# Patient Record
Sex: Female | Born: 1995
Health system: Southern US, Community
[De-identification: ages and names within clinical notes are randomized; demographics above are authoritative.]

## PROBLEM LIST (undated history)

## (undated) DIAGNOSIS — Z789 Other specified health status: Secondary | ICD-10-CM

## (undated) HISTORY — PX: INGUINAL HERNIA REPAIR: SUR1180

## (undated) HISTORY — PX: OVARIAN CYST REMOVAL: SHX89

---

## 2020-01-10 ENCOUNTER — Other Ambulatory Visit: Payer: Self-pay

## 2020-01-10 ENCOUNTER — Emergency Department (HOSPITAL_COMMUNITY)
Admission: EM | Admit: 2020-01-10 | Discharge: 2020-01-10 | Disposition: A | Discharge status: Home / Self Care | Payer: 59 | Attending: Emergency Medicine | Admitting: Emergency Medicine

## 2020-01-10 ENCOUNTER — Emergency Department (HOSPITAL_COMMUNITY): Payer: 59

## 2020-01-10 DIAGNOSIS — N941 Unspecified dyspareunia: Secondary | ICD-10-CM | POA: Diagnosis not present

## 2020-01-10 DIAGNOSIS — R102 Pelvic and perineal pain: Secondary | ICD-10-CM | POA: Insufficient documentation

## 2020-01-10 DIAGNOSIS — R350 Frequency of micturition: Secondary | ICD-10-CM | POA: Diagnosis not present

## 2020-01-10 LAB — URINALYSIS, ROUTINE W REFLEX MICROSCOPIC
Bilirubin Urine: NEGATIVE
Glucose, UA: NEGATIVE mg/dL
Hgb urine dipstick: NEGATIVE
Ketones, ur: 5 mg/dL — AB
Leukocytes,Ua: NEGATIVE
Nitrite: NEGATIVE
Protein, ur: NEGATIVE mg/dL
Specific Gravity, Urine: 1.008 (ref 1.005–1.030)
pH: 7 (ref 5.0–8.0)

## 2020-01-10 LAB — COMPREHENSIVE METABOLIC PANEL
ALT: 16 U/L (ref 0–44)
AST: 21 U/L (ref 15–41)
Albumin: 4.1 g/dL (ref 3.5–5.0)
Alkaline Phosphatase: 49 U/L (ref 38–126)
Anion gap: 8 (ref 5–15)
BUN: 5 mg/dL — ABNORMAL LOW (ref 6–20)
CO2: 25 mmol/L (ref 22–32)
Calcium: 9.3 mg/dL (ref 8.9–10.3)
Chloride: 104 mmol/L (ref 98–111)
Creatinine, Ser: 0.63 mg/dL (ref 0.44–1.00)
GFR, Estimated: >60 mL/min (ref >60)
Glucose, Bld: 86 mg/dL (ref 70–99)
Potassium: 3.9 mmol/L (ref 3.5–5.1)
Sodium: 137 mmol/L (ref 135–145)
Total Bilirubin: 0.8 mg/dL (ref 0.3–1.2)
Total Protein: 7.9 g/dL (ref 6.5–8.1)

## 2020-01-10 LAB — CBC
HCT: 37.3 % (ref 36.0–46.0)
Hemoglobin: 11.9 g/dL — ABNORMAL LOW (ref 12.0–15.0)
MCH: 27.4 pg (ref 26.0–34.0)
MCHC: 31.9 g/dL (ref 30.0–36.0)
MCV: 85.9 fL (ref 80.0–100.0)
Platelets: 286 10*3/uL (ref 150–400)
RBC: 4.34 MIL/uL (ref 3.87–5.11)
RDW: 12.9 % (ref 11.5–15.5)
WBC: 6.4 10*3/uL (ref 4.0–10.5)
nRBC: 0 % (ref 0.0–0.2)

## 2020-01-10 LAB — LIPASE, BLOOD: Lipase: 28 U/L (ref 11–51)

## 2020-01-10 LAB — I-STAT BETA HCG BLOOD, ED (MC, WL, AP ONLY): I-stat hCG, quantitative: <5 m[IU]/mL (ref <5)

## 2020-01-10 NOTE — ED Triage Notes (Signed)
Pt reports sharp lower abdominal pain x a few months, mostly when she is having sex but also feels pain other times. Also has frequent urination x a few months. Had negative workup for same in [place] and was referred to a GYN there but has since moved to the [place] and has not seen a GYN yet. Hx ovarian cyst and surgery for same.

## 2020-01-10 NOTE — ED Provider Notes (Signed)
[facility] EMERGENCY DEPARTMENT Provider Note   CSN: [id number] Arrival date & time: 01/10/20  1033     History Chief Complaint  Patient presents with  . Abdominal Pain    Crystal Bentley is a 24 y.o. female.  24 year old female with complaint of pelvic pain x approximately 5 months. Patient reports moving to the [place] from [place], was seen for this in [place] in September, had a workup in [place] including ultrasound complete abdomen and testing for gonorrhea and chlamydia which were negative. Patient was married 1 year ago, reports to be in a monogamous relationship, denies vaginal discharge. Pain is intermittent, occurs frequently with intercourse, located across low pelvis. Also urinary frequency without dysuria. Denies, nausea, vomiting, unintentional weight loss, changes in bowel habits. No other complaints or concerns. Prior abdominal surgeries include ovarian cyst removal and hernia repair.         No past medical history on file.  There are no problems to display for this patient.   OB History   No obstetric history on file.     No family history on file.  Social History   Tobacco Use  . Smoking status: Not on file  Substance Use Topics  . Alcohol use: Not on file  . Drug use: Not on file    Home Medications Prior to Admission medications   Medication Sig Start Date End Date Taking? Authorizing Provider  OVER THE COUNTER MEDICATION Take 1 tablet by mouth as needed (uti infection).   Yes [provider]    Allergies    Patient has no known allergies.  Review of Systems   Review of Systems  Constitutional: Negative for fever and unexpected weight change.  Gastrointestinal: Negative for abdominal pain, constipation, diarrhea, nausea and vomiting.  Genitourinary: Positive for frequency and pelvic pain. Negative for dysuria, vaginal bleeding and vaginal discharge.  Musculoskeletal: Negative for back pain.  Skin: Negative for rash  and wound.  Allergic/Immunologic: Negative for immunocompromised state.  Neurological: Negative for weakness.  All other systems reviewed and are negative.   Physical Exam Updated Vital Signs BP 118/76 (BP Location: Right Arm)   Pulse 72   Temp 98.1 F (36.7 C) (Oral)   Resp 16   Ht 5\' 7"  (1.702 m)   Wt 56.7 kg   SpO2 100%   BMI 19.58 kg/m   Physical Exam Vitals and nursing note reviewed.  Constitutional:      General: She is not in acute distress.    Appearance: She is well-developed. She is not diaphoretic.  HENT:     Head: Normocephalic and atraumatic.  Cardiovascular:     Rate and Rhythm: Normal rate and regular rhythm.     Heart sounds: Normal heart sounds.  Pulmonary:     Effort: Pulmonary effort is normal.     Breath sounds: Normal breath sounds.  Abdominal:     General: Abdomen is flat. Bowel sounds are normal.     Palpations: Abdomen is soft.     Tenderness: There is abdominal tenderness in the right lower quadrant, suprapubic area and left lower quadrant. There is no right CVA tenderness or left CVA tenderness.  Skin:    General: Skin is warm and dry.     Findings: No erythema or rash.  Neurological:     Mental Status: She is alert and oriented to person, place, and time.  Psychiatric:        Behavior: Behavior normal.     ED Results /  Procedures / Treatments   Labs (all labs ordered are listed, but only abnormal results are displayed) Labs Reviewed  COMPREHENSIVE METABOLIC PANEL - Abnormal; Notable for the following components:      Result Value   BUN 5 (*)    All other components within normal limits  CBC - Abnormal; Notable for the following components:   Hemoglobin 11.9 (*)    All other components within normal limits  URINALYSIS, ROUTINE W REFLEX MICROSCOPIC - Abnormal; Notable for the following components:   Color, Urine STRAW (*)    Ketones, ur 5 (*)    All other components within normal limits  LIPASE, BLOOD  I-STAT BETA HCG BLOOD, ED  ([facility], [facility], [facility] ONLY)    EKG None  Radiology US PELVIC COMPLETE WITH TRANSVAGINAL  Result Date: 01/10/2020 CLINICAL DATA:  Pelvic pain for 5 months EXAM: TRANSABDOMINAL AND TRANSVAGINAL ULTRASOUND OF PELVIS TECHNIQUE: Both transabdominal and transvaginal ultrasound examinations of the pelvis were performed. Transabdominal technique was performed for global imaging of the pelvis including uterus, ovaries, adnexal regions, and pelvic cul-de-sac. It was necessary to proceed with endovaginal exam following the transabdominal exam to visualize the adnexal structures. COMPARISON:  None FINDINGS: Uterus Measurements: 6.8 x 3.8 x 5.1 cm = volume: 60.6 mL. No fibroids or other mass visualized. Endometrium Thickness: 12 mm.  No focal abnormality visualized. Right ovary Measurements: 2.3 x 3.9 x 1.4 cm = volume: 6.3 mL. Normal appearance/no adnexal mass. Left ovary Not visualized due to bowel gas. Other findings No free fluid or pelvic mass. IMPRESSION: 1. Nonvisualization of the left ovary. 2. Otherwise unremarkable pelvic ultrasound. Electronically Signed   By: Sharlet Salina M.D.   On: 01/10/2020 15:30    Procedures Procedures (including critical care time)  Medications Ordered in ED Medications - No data to display  ED Course  I have reviewed the triage vital signs and the nursing notes.  Pertinent labs & imaging results that were available during my care of the patient were reviewed by me and considered in my medical decision making (see chart for details).  Clinical Course as of Jan 10 1744  Fri Jan 10, 2020  1771 24 year old female with complaint of pelvic pain for several months, worked up 2 months ago while living in [place] with an [place] of her abdomen, labs, also tested for gonorrhea and chlamydia. Prior work up was negative for cause of her pain and she was referred to GYN in [place] but moved before being seen. Patient reports persistent symptoms, pain worse with intercourse, no new or change in  symptoms. On exam, has mild tenderness across low abdomen, pelvic exam deferred as prior work up negative per patient and reports monogamous relationship without concerns for STI.  Pelvic US today is unremarkable, left ovary not visualized, doubt torsion with history of pain for 5 months.  Labs without significant findings including CBC, CMP, lipase, UA, hcg negative. Patient referred to [facility] clinic for follow up. Advised to take Tylenol for pain, avoid NSAIDs if trying to conceive.    [LM]    Clinical Course User Index [LM] Alden Hipp   MDM Rules/Calculators/A&P                          Final Clinical Impression(s) / ED Diagnoses Final diagnoses:  Pelvic pain in female  Urinary frequency  Dyspareunia in female    Rx / DC Orders ED Discharge Orders    None  Jeannie Fend, PA-C 01/10/20 1745    Gerhard Munch, MD 01/11/20 2244

## 2020-01-10 NOTE — Discharge Instructions (Addendum)
Take Tylenol as needed as directed for pain. Follow up with gynecology, listing of clinics provided. Return to the ER for worsening or concerning symptoms.    Walk-ins for certain complaints available at:   [facility] [address]  [phone]  See hours at [email or web address]   [facility] [employer] [facility]  [address]  [phone]   [facility] [employer] [facility] [employer]  [phone]   You can make an appointment to see a GYN provider:   [facility] for [facility] at [facility]  [address]  [phone]   [facility] for [facility] at [facility]  [address] [employer]  [phone]   Center for [facility] at [facility]  [phone]   Center for [facility] at [facility]  [address]  [phone]   If you already have an established OB/GYN provider in the area you can make an appointment with them as well.

## 2020-01-30 ENCOUNTER — Ambulatory Visit: Payer: PRIVATE HEALTH INSURANCE | Admitting: Obstetrics and Gynecology

## 2020-02-08 NOTE — L&D Delivery Note (Addendum)
LABOR COURSE Patient was admitted for SROM at 0800 on 12/5. Fluid noted to be light mec. Mother remained afebrile throughout labor course. Labor was augmented with pitocin. Progressed to complete without issue.   Delivery Note Called to room and patient was complete and pushing. Head delivered LOA. A nuchal and body cord were present and easily reduced at the perineum. Shoulder and body delivered in usual fashion. At  2113 a viable female was delivered via Vaginal, Spontaneous (LOA).  Infant with spontaneous cry, placed on mother's abdomen, dried and stimulated. Cord clamped x 2 after 1-minute delay, and cut by FOB. Cord blood drawn. Placenta delivered spontaneously with gentle cord traction. Appears intact. Fundus firm with massage and Pitocin. Labia, perineum, vagina, and cervix inspected with 2nd degree laceration. Laceration repaired with 3.0 vicryl.    APGAR: 8,9 ; 3480 g weight  .   Cord: 3VC     Anesthesia: Epidural Episiotomy: None Lacerations: 2nd degree Suture Repair: 3.0 vicryl Est. Blood Loss (mL): 100  Mom to postpartum.  Baby to Couplet care / Skin to Skin.  Betti Cruz, MD PGY-2 01/12/2021 10:17 PM   GME ATTESTATION:  I was gloved for entire delivery and supervised and assisted Dr. Criss Rosales with delivery and entire repair. I agree with the findings and the plan of care as documented in the resident's note.  Warner Mccreedy, MD, MPH OB Fellow, Faculty Practice [facility], [facility] 01/13/2021 3:48 AM

## 2020-02-27 ENCOUNTER — Encounter: Payer: Self-pay | Admitting: Obstetrics and Gynecology

## 2020-02-27 ENCOUNTER — Ambulatory Visit (INDEPENDENT_AMBULATORY_CARE_PROVIDER_SITE_OTHER): Payer: BC Managed Care – PPO | Admitting: Obstetrics and Gynecology

## 2020-02-27 ENCOUNTER — Other Ambulatory Visit (HOSPITAL_COMMUNITY)
Admission: RE | Admit: 2020-02-27 | Discharge: 2020-02-27 | Disposition: A | Discharge status: Home / Self Care | Payer: BC Managed Care – PPO | Source: Ambulatory Visit | Attending: Obstetrics and Gynecology | Admitting: Obstetrics and Gynecology

## 2020-02-27 ENCOUNTER — Other Ambulatory Visit: Payer: Self-pay

## 2020-02-27 VITALS — BP 121/85 | HR 82 | Wt 115.8 lb

## 2020-02-27 DIAGNOSIS — N941 Unspecified dyspareunia: Secondary | ICD-10-CM | POA: Diagnosis not present

## 2020-02-27 DIAGNOSIS — Z01411 Encounter for gynecological examination (general) (routine) with abnormal findings: Secondary | ICD-10-CM | POA: Diagnosis not present

## 2020-02-27 LAB — POCT URINALYSIS DIPSTICK
Bilirubin, UA: NEGATIVE
Blood, UA: NEGATIVE
Glucose, UA: NEGATIVE
Ketones, UA: NEGATIVE
Leukocytes, UA: NEGATIVE
Nitrite, UA: NEGATIVE
Protein, UA: NEGATIVE
Spec Grav, UA: 1.015 (ref 1.010–1.025)
Urobilinogen, UA: 0.2 E.U./dL
pH, UA: 7 (ref 5.0–8.0)

## 2020-02-27 LAB — POCT URINE PREGNANCY: Preg Test, Ur: NEGATIVE

## 2020-02-27 NOTE — Progress Notes (Signed)
  Subjective:     Crystal Bentley is a 25 y.o. female P0 with LMP 02/17/20 amd BMI 18 who is here for a comprehensive physical exam. The patient reports a 6 month history of dyspareunia. She was being evaluated for this in [place] prior to her arrival in the [place] and reports a normal ultrasound and STI testing. She is in a monogomous relationship. She reports a monthly period lasting 5 days and associated with mild dysmenorrhea. She is sexual active with her husband and reports lower abdominal pain with penetration regardless of position. She describes the pain as a sharp non radiating pain localized in the RLQ and near hernia repair site. The pain is relieved by tylenol. The pain is always present during intercourse and also occurs intermittently at rest. The pain typically last 15 minutes.  History reviewed. No pertinent past medical history. Past Surgical History:  Procedure Laterality Date  . INGUINAL HERNIA REPAIR    . OVARIAN CYST REMOVAL     No family history on file.  Social History   Socioeconomic History  . Marital status: Single    Spouse name: Not on file  . Number of children: Not on file  . Years of education: Not on file  . Highest education level: Not on file  Occupational History  . Not on file  Tobacco Use  . Smoking status: Not on file  . Smokeless tobacco: Not on file  Substance and Sexual Activity  . Alcohol use: Not on file  . Drug use: Not on file  . Sexual activity: Not on file  Other Topics Concern  . Not on file  Social History Narrative  . Not on file   Social Determinants of Health   Financial Resource Strain: Not on file  Food Insecurity: Not on file  Transportation Needs: Not on file  Physical Activity: Not on file  Stress: Not on file  Social Connections: Not on file  Intimate Partner Violence: Not on file   Health Maintenance  Topic Date Due  . Hepatitis C Screening  Never done  . HIV Screening  Never done  . TETANUS/TDAP  Never done  .  PAP-Cervical Cytology Screening  Never done  . PAP SMEAR-Modifier  Never done  . INFLUENZA VACCINE  Never done       Review of Systems Pertinent items noted in HPI and remainder of comprehensive ROS otherwise negative.   Objective:  Blood pressure 121/85, pulse 82, weight 115 lb 12.8 oz (52.5 kg), last menstrual period 02/17/2020.     GENERAL: Well-developed, well-nourished female in no acute distress.  HEENT: Normocephalic, atraumatic. Sclerae anicteric.  NECK: Supple. Normal thyroid.  LUNGS: Clear to auscultation bilaterally.  HEART: Regular rate and rhythm. BREASTS: Symmetric in size. No palpable masses or lymphadenopathy, skin changes, or nipple drainage. ABDOMEN: Soft, nontender, nondistended. No organomegaly. PELVIC: Normal external female genitalia. Vagina is pink and rugated.  Normal discharge. Normal appearing cervix. Uterus is normal in size.  No adnexal mass or tenderness. EXTREMITIES: No cyanosis, clubbing, or edema, 2+ distal pulses.    Assessment:    Healthy female exam.      Plan:    Pap smear collected Vaginal swab collected to rule out infectious cause of pelvic pain Urine culture collected Patient with normal pelvic ultrasound 01/2020 Patient referred to pelvic physical therapy  Patient will be contacted with abnormal results See After Visit Summary for Counseling Recommendations

## 2020-02-27 NOTE — Progress Notes (Signed)
NGYN pt c/o 8 days late period, painful periods, urinary frequency and dyspareunia. Pt unsure last pap Normal STD screening completed Sept 2021 in [place]

## 2020-02-28 LAB — CERVICOVAGINAL ANCILLARY ONLY
Bacterial Vaginitis (gardnerella): NEGATIVE
Candida Glabrata: NEGATIVE
Candida Vaginitis: NEGATIVE
Chlamydia: NEGATIVE
Comment: NEGATIVE
Comment: NEGATIVE
Comment: NEGATIVE
Comment: NEGATIVE
Comment: NEGATIVE
Comment: NORMAL
Neisseria Gonorrhea: NEGATIVE
Trichomonas: NEGATIVE

## 2020-02-28 LAB — CYTOLOGY - PAP
Adequacy: ABSENT
Diagnosis: NEGATIVE

## 2020-02-29 LAB — URINE CULTURE

## 2020-04-13 ENCOUNTER — Other Ambulatory Visit: Payer: Self-pay

## 2020-04-13 ENCOUNTER — Encounter: Payer: Self-pay | Admitting: Physical Therapy

## 2020-04-13 ENCOUNTER — Ambulatory Visit: Payer: BC Managed Care – PPO | Attending: Obstetrics and Gynecology | Admitting: Physical Therapy

## 2020-04-13 DIAGNOSIS — M6281 Muscle weakness (generalized): Secondary | ICD-10-CM | POA: Insufficient documentation

## 2020-04-13 DIAGNOSIS — R278 Other lack of coordination: Secondary | ICD-10-CM | POA: Insufficient documentation

## 2020-04-13 DIAGNOSIS — R1031 Right lower quadrant pain: Secondary | ICD-10-CM | POA: Insufficient documentation

## 2020-04-13 NOTE — Therapy (Signed)
[facility] [address] [place], [place], [address] Phone: [phone]   Fax:  [phone]  Physical Therapy Evaluation  Patient Details  Name: Crystal Bentley MRN: [id number] Date of Birth: 1995-05-28 Referring Provider (PT): Dr. Catalina Antigua   Encounter Date: 04/13/2020   PT End of Session - 04/13/20 1442    Visit Number 1    Date for PT Re-Evaluation 07/06/20    Authorization Type BC[employer]    PT Start Time 1400    PT Stop Time 1444    PT Time Calculation (min) 44 min    Activity Tolerance Patient tolerated treatment well    Behavior During Therapy [facility] for tasks assessed/performed           History reviewed. No pertinent past medical history.  Past Surgical History:  Procedure Laterality Date  . INGUINAL HERNIA REPAIR     5 yrs   . OVARIAN CYST REMOVAL     7 yrs     There were no vitals filed for this visit.    Subjective Assessment - 04/13/20 1406    Subjective After second operation for hernia repair. Patient feels a sharp pain when cough and sneeze at the operation site. 4 months ago has felt serious pain in right lower quadrant in inguinal area. when has sex with husband has to stop due to pain. Urinates more often. When lays on back feels the urge to urinate. Sometimes has back pain.    Patient Stated Goals reduce pain    Currently in Pain? Yes    Pain Score 10-Worst pain ever    Pain Location Abdomen    Pain Orientation Right    Pain Descriptors / Indicators Cramping    Pain Type Acute pain    Pain Onset More than a month ago    Pain Frequency Intermittent    Aggravating Factors  penile penetration, just happens    Pain Relieving Factors relax on the floor in prone, tylenol    Multiple Pain Sites Yes    Pain Score 5    Pain Location Back    Pain Orientation Right    Pain Descriptors / Indicators Aching    Pain Type Acute pain    Pain Onset More than a month ago    Pain Frequency  Intermittent    Aggravating Factors  comes on sporadically    Pain Relieving Factors massage              [facility] PT Assessment - 04/13/20 0001      Assessment   Medical Diagnosis N94.10 Dyspareunia in female    Referring Provider (PT) Dr. Catalina Antigua    Prior Therapy none      Precautions   Precautions None      Restrictions   Weight Bearing Restrictions No      Balance Screen   Has the patient fallen in the past 6 months No    Has the patient had a decrease in activity level because of a fear of falling?  No    Is the patient reluctant to leave their home because of a fear of falling?  No      Home Tourist information centre manager residence      Prior Function   Level of Independence Independent    Leisure sewing      Cognition   Overall Cognitive Status Within Functional Limits for tasks assessed      Posture/Postural Control  Posture/Postural Control No significant limitations      ROM / Strength   AROM / PROM / Strength AROM;PROM;Strength      AROM   Lumbar - Right Side Bend decreased by 25%    Lumbar - Right Rotation decreased by 25%      Strength   Right Hip ABduction 4/5    Left Hip ABduction 5/5      Palpation   Spinal mobility L5 decreased movement    SI assessment  ASIS are equal    Palpation comment tenderness along scar lower midline abdominal area from cyst removal; tenderness located along the right lower quadrant                      Objective measurements completed on examination: See above findings.     Pelvic Floor Special Questions - 04/13/20 0001    Currently Sexually Active Yes    Is this Painful Yes   deep penetration   Urinary Leakage No    Fecal incontinence No   strain for a bowel movement   Skin Integrity Intact    Pelvic Floor Internal Exam [name]    Exam Type Vaginal    Palpation tenderness in the right obturator  internist and illiococcygeus    Strength weak squeeze, no lift    Tone high tone, not able to bulge the pelvic floor                    PT Education - 04/13/20 1440    Education Details Access Code: [email or web address]    Person(s) Educated Patient    Methods Explanation;Demonstration;Verbal cues;Handout    Comprehension Returned demonstration;Verbalized understanding            PT Short Term Goals - 04/13/20 1458      PT SHORT TERM GOAL #1   Title independent with initial HEP including scar massage and perineal massage    Time 4    Status New    Target Date 05/11/20      PT SHORT TERM GOAL #2   Title ability to bulge the pelvic floor with diaphragmatic breathing    Time 4    Period Weeks    Status New    Target Date 05/11/20             PT Long Term Goals - 04/13/20 1459      PT LONG TERM GOAL #1   Title independent with advanced HEP    Time 12    Period Weeks    Status New    Target Date 07/06/20      PT LONG TERM GOAL #2   Title able to have penile penetration vaginally with pain level </= 0-1/10 due to reduction of scar tissue and improve tissue mobitliy    Time 12    Period Weeks    Status New    Target Date 07/06/20      PT LONG TERM GOAL #3   Title able to perform daily activities with back pain </= 0-1/10 due to improve mobility    Time 12    Period Weeks    Status New    Target Date 07/06/20      PT LONG TERM GOAL #4   Title able to have a bowel movement without straining due to reduction of pelvic floor tightness and elongation with breath    Time 12  Period Weeks    Status New    Target Date 07/06/20                  Plan - 04/13/20 1440    Clinical Impression Statement Patient is a 25 year old female with dyspareunia for several years. Patient is s/p right ovarian cyst removal and right hernia repair. Patient reports her right lower quadrant pain is 10/10 with penile penetration vaginally and randomly. Right back pain is 5/10  randomly. Lumbar right rotation and sidebending is decreased by 25%. Patient bilateral hip abduction is 4/5. Tenderness located in right lower quadrant, right obturator internist and right iliococcygeus. Patient has increased tone of right pelvic floor muscles. Pelvic strength is 2/5 with difficulty relaxing the pelvic floor after a contraction. Patient is not able to bulge the pelvic floor. Patient reports she has to strain when having a bowel movement. Patient has decreased mobilit of the scar along the low abdominal midline.  Patient will benefit from skilled therapy to reduce pain, reduce trigger points in the muscles and improve tissue mobility.    Personal Factors and Comorbidities Comorbidity 2;Sex    Comorbidities right hernia repair; right ovarian cyst removed    Examination-Participation Restrictions Interpersonal Relationship;Community Activity    Stability/Clinical Decision Making Stable/Uncomplicated    Clinical Decision Making Low    Rehab Potential Excellent    PT Frequency 1x / week    PT Duration 12 weeks    PT Treatment/Interventions ADLs/Self Care Home Management;Biofeedback;Cryotherapy;Electrical Stimulation;Iontophoresis 4mg /ml Dexamethasone;Moist Heat;Ultrasound;Neuromuscular re-education;Therapeutic exercise;Therapeutic activities;Patient/family education;Manual techniques;Dry needling;Scar mobilization;Spinal Manipulations    PT Next Visit Plan manual work to abdomen and educate on scar mobilization; diaphragmatic breathing to bulge the pelvic floor; internal manual work and educate patient    PT Home Exercise Plan Access Code:    Consulted and Agree with Plan of Care Patient           Patient will benefit from skilled therapeutic intervention in order to improve the following deficits and impairments:  Decreased coordination,Decreased range of motion,Increased fascial restricitons,Decreased endurance,Increased muscle spasms,Pain,Decreased activity  tolerance,Decreased scar mobility,Decreased strength  Visit Diagnosis: Muscle weakness (generalized) - Plan: PT plan of care cert/re-cert  Other lack of coordination - Plan: PT plan of care cert/re-cert  Right lower quadrant abdominal pain - Plan: PT plan of care cert/re-cert     Problem List There are no problems to display for this patient.   [id number], PT 04/13/20 3:04 PM   [facility] [address], [address] [place], [place], [place] Phone: [phone]   Fax:  [phone]  Name: Crystal Bentley MRN: Earle Gell Date of Birth: Aug 03, 1995

## 2020-04-13 NOTE — Patient Instructions (Signed)
Access Code: [email or web address] URL: [email or web address] Date: 04/13/2020 Prepared by: Eulis Foster  Exercises Supine Diaphragmatic Breathing - 3 x daily - 7 x weekly - 1 sets - 10 reps [facility] [address] [place], [place] [address] Phone # [phone] Fax [phone]

## 2020-04-27 ENCOUNTER — Ambulatory Visit: Payer: BC Managed Care – PPO | Admitting: Physical Therapy

## 2020-04-27 ENCOUNTER — Encounter: Payer: Self-pay | Admitting: Physical Therapy

## 2020-04-27 ENCOUNTER — Other Ambulatory Visit: Payer: Self-pay

## 2020-04-27 DIAGNOSIS — R278 Other lack of coordination: Secondary | ICD-10-CM

## 2020-04-27 DIAGNOSIS — R1031 Right lower quadrant pain: Secondary | ICD-10-CM

## 2020-04-27 DIAGNOSIS — M6281 Muscle weakness (generalized): Secondary | ICD-10-CM

## 2020-04-27 NOTE — Therapy (Signed)
[facility] [address], [place], [place], [address] Phone: [phone]   Fax:  [phone]  Physical Therapy Treatment  Patient Details  Name: Crystal Bentley MRN: [id number] Date of Birth: 1995-05-08 Referring Provider (PT): Dr. Mora Bellman   Encounter Date: 04/27/2020   PT End of Session - 04/27/20 0934    Visit Number 2    Date for PT Re-Evaluation 07/06/20    Authorization Type BCBS    PT Start Time 0930    PT Stop Time 1008    PT Time Calculation (min) 38 min    Activity Tolerance Patient tolerated treatment well    Behavior During Therapy [facility] for tasks assessed/performed           History reviewed. No pertinent past medical history.  Past Surgical History:  Procedure Laterality Date  . INGUINAL HERNIA REPAIR     4 yrs   . OVARIAN CYST REMOVAL     6 yrs     There were no vitals filed for this visit.   Subjective Assessment - 04/27/20 0935    Subjective I feel a little better.    Patient Stated Goals reduce pain    Currently in Pain? Yes    Pain Score 10-Worst pain ever    Pain Location Abdomen    Pain Orientation Right    Pain Descriptors / Indicators Cramping    Pain Type Acute pain    Pain Onset More than a month ago    Pain Frequency Intermittent    Aggravating Factors  penile penetration, just happens    Pain Relieving Factors relax on the floor in prone, tylenol    Multiple Pain Sites Yes    Pain Score 5    Pain Location Back    Pain Orientation Right    Pain Descriptors / Indicators Aching    Pain Type Acute pain    Pain Onset More than a month ago    Pain Frequency Intermittent    Aggravating Factors  comes on sporadically    Pain Relieving Factors massage                             [facility] Adult PT Treatment/Exercise - 04/27/20 0001      Neuro Re-ed    Neuro Re-ed Details  diaphragmatic to open up the lower rib cage and abdomen with tactile cues       Lumbar Exercises: Stretches   Active Hamstring Stretch Right;Left;1 rep;30 seconds    Active Hamstring Stretch Limitations supine    Prone on Elbows Stretch 2 reps;20 seconds    Quadruped Mid Back Stretch 2 reps;30 seconds    Quadruped Mid Back Stretch Limitations childs pose    Piriformis Stretch Right;Left;1 rep    Piriformis Stretch Limitations supine      Lumbar Exercises: Quadruped   Madcat/Old Horse 15 reps      Manual Therapy   Manual Therapy Soft tissue mobilization    Manual therapy comments educated patient on scar massage and she returned demonstration    Soft tissue mobilization scar massage to improve mobiity, manual tissue work to the right side of abdomials to improve mobility                  PT Education - 04/27/20 0948    Education Details Access Code: [email or web address]; diaphragmatic breathing; scar massage    Person(s) Educated Patient    Methods Explanation;Demonstration;Verbal  cues;Handout    Comprehension Returned demonstration;Verbalized understanding            PT Short Term Goals - 04/27/20 0949      PT SHORT TERM GOAL #1   Title independent with initial HEP including scar massage and perineal massage    Time 4    Period Weeks    Status New    Target Date 05/11/20      PT SHORT TERM GOAL #2   Title ability to bulge the pelvic floor with diaphragmatic breathing    Time 4    Period Weeks    Status New    Target Date 05/11/20             PT Long Term Goals - 04/13/20 1459      PT LONG TERM GOAL #1   Title independent with advanced HEP    Time 12    Period Weeks    Status New    Target Date 07/06/20      PT LONG TERM GOAL #2   Title able to have penile penetration vaginally with pain level </= 0-1/10 due to reduction of scar tissue and improve tissue mobitliy    Time 12    Period Weeks    Status New    Target Date 07/06/20      PT LONG TERM GOAL #3   Title able to perform daily activities with back pain </= 0-1/10 due to  improve mobility    Time 12    Period Weeks    Status New    Target Date 07/06/20      PT LONG TERM GOAL #4   Title able to have a bowel movement without straining due to reduction of pelvic floor tightness and elongation with breath    Time 12    Period Weeks    Status New    Target Date 07/06/20                 Plan - 04/27/20 0933    Clinical Impression Statement Patient understands how to perfrom scar massage and diaphragmatic breathing. Difficult to lift scar off the abdomen due to restrictions. Patient needed verbal cues to perform diaphgram atic breathing. Patient just started so she has not met goals yet. Patient will benefit from skilled therapy to reduce pain, reduce trigger points in the muscles and improve tissue mobilty.    Personal Factors and Comorbidities Comorbidity 2;Sex    Comorbidities right hernia repair; right ovarian cyst removed    Examination-Participation Restrictions Interpersonal Relationship;Community Activity    Stability/Clinical Decision Making Stable/Uncomplicated    Rehab Potential Excellent    PT Frequency 1x / week    PT Duration 12 weeks    PT Treatment/Interventions ADLs/Self Care Home Management;Biofeedback;Cryotherapy;Electrical Stimulation;Iontophoresis 41m/ml Dexamethasone;Moist Heat;Ultrasound;Neuromuscular re-education;Therapeutic exercise;Therapeutic activities;Patient/family education;Manual techniques;Dry needling;Scar mobilization;Spinal Manipulations    PT Next Visit Plan ; internal manual work and educate patient; manual work from the back to the abdomen, see how stretches are coming along, review diaphgramatic breathing    PT Home Exercise Plan Access Code: [email or web address]   Recommended Other Services MD signed initial eval    Consulted and Agree with Plan of Care Patient           Patient will benefit from skilled therapeutic intervention in order to improve the following deficits and impairments:  Decreased coordination,Decreased  range of motion,Increased fascial restricitons,Decreased endurance,Increased muscle spasms,Pain,Decreased activity tolerance,Decreased scar mobility,Decreased strength  Visit Diagnosis: Muscle weakness (generalized)  Other lack of coordination  Right lower quadrant abdominal pain     Problem List There are no problems to display for this patient.   Earlie Counts, PT 04/27/20 10:14 AM   [facility] [address] [place], [place], [address] Phone: [phone]   Fax:  [phone]  Name: Sophee Mckimmy MRN: [id number] Date of Birth: 10-May-1995

## 2020-04-27 NOTE — Patient Instructions (Signed)
Access Code: [email or web address] URL: [email or web address] Date: 04/27/2020 Prepared by: Eulis Foster  Exercises Supine Piriformis Stretch - 1 x daily - 7 x weekly - 1 sets - 2 reps - 30 sec hold Supine Hamstring Stretch - 1 x daily - 7 x weekly - 1 sets - 2 reps - 30 sec hold Supine Pelvic Floor Stretch - 1 x daily - 7 x weekly - 1 sets - 1 reps - 1 min hold Cat Cow - 1 x daily - 7 x weekly - 1 sets - 10 reps Child's Pose Stretch - 1 x daily - 7 x weekly - 1 sets - 2 reps - 30 sec hold Static Prone on Elbows - 1 x daily - 7 x weekly - 1 sets - 2 reps - 15 sec hold [facility] [address], [address] [place], [place] [address] Phone # [phone] Fax [phone]

## 2020-05-04 ENCOUNTER — Encounter: Payer: Self-pay | Admitting: Physical Therapy

## 2020-05-04 ENCOUNTER — Other Ambulatory Visit: Payer: Self-pay

## 2020-05-04 ENCOUNTER — Ambulatory Visit: Payer: BC Managed Care – PPO | Admitting: Physical Therapy

## 2020-05-04 DIAGNOSIS — R1031 Right lower quadrant pain: Secondary | ICD-10-CM

## 2020-05-04 DIAGNOSIS — R278 Other lack of coordination: Secondary | ICD-10-CM

## 2020-05-04 DIAGNOSIS — M6281 Muscle weakness (generalized): Secondary | ICD-10-CM

## 2020-05-04 NOTE — Patient Instructions (Addendum)

## 2020-05-04 NOTE — Therapy (Signed)
[facility] [address] [place], [place], [address] Phone: [phone]   Fax:  [phone]  Physical Therapy Treatment  Patient Details  Name: Crystal Bentley MRN: [id number] Date of Birth: 06-06-1995 Referring Provider (PT): Dr. Catalina Antigua   Encounter Date: 05/04/2020   PT End of Session - 05/04/20 1226    Visit Number 3    Date for PT Re-Evaluation 07/06/20    Authorization Type BCBS    PT Start Time 1145    PT Stop Time 1225    PT Time Calculation (min) 40 min    Activity Tolerance Patient tolerated treatment well    Behavior During Therapy [facility] for tasks assessed/performed           History reviewed. No pertinent past medical history.  Past Surgical History:  Procedure Laterality Date  . INGUINAL HERNIA REPAIR     4 yrs   . OVARIAN CYST REMOVAL     6 yrs     There were no vitals filed for this visit.   Subjective Assessment - 05/04/20 1150    Subjective I have pain in my back.    Patient Stated Goals reduce pain    Currently in Pain? Yes    Pain Score 4     Pain Location Abdomen    Pain Orientation Right    Pain Descriptors / Indicators Cramping    Pain Type Acute pain    Pain Onset More than a month ago    Pain Frequency Intermittent    Aggravating Factors  penile penetration ,just happens    Pain Relieving Factors relax on the floor in prone, tylenol    Multiple Pain Sites Yes    Pain Score 6    Pain Location Back    Pain Orientation Right    Pain Descriptors / Indicators Aching    Pain Type Acute pain    Pain Onset More than a month ago    Pain Frequency Intermittent    Aggravating Factors  comes on sporadically    Pain Relieving Factors massage              [facility] PT Assessment - 05/04/20 0001      AROM   Lumbar - Right Side Bend full but painful    Lumbar - Right Rotation full but painful                         [facility] Adult PT Treatment/Exercise -  05/04/20 0001      Lumbar Exercises: Stretches   Active Hamstring Stretch Right;Left;1 rep;30 seconds    Active Hamstring Stretch Limitations supine    Lower Trunk Rotation 2 reps;30 seconds    Lower Trunk Rotation Limitations supine on each side    Piriformis Stretch Right;Left;1 rep    Piriformis Stretch Limitations supine    Other Lumbar Stretch Exercise happy baby with one leg      Manual Therapy   Manual Therapy Soft tissue mobilization;Myofascial release    Manual therapy comments to assess for dry needling    Soft tissue mobilization using the addaday to the lumbar paraspinals to elongate after dry needling; manual mobilization to the soft tissue of the lumbar muslces and along the sides  of the trunk    Myofascial Release tissue rolling of the lumbar            Trigger Point Dry Needling - 05/04/20 0001    Consent Given?  Yes    Education Handout Provided Yes    Muscles Treated Back/Hip Lumbar multifidi    Lumbar multifidi Response Twitch response elicited;Palpable increased muscle length                PT Education - 05/04/20 1222    Education Details information on dry needling    Person(s) Educated Patient    Methods Explanation;Handout    Comprehension Verbalized understanding            PT Short Term Goals - 05/04/20 1223      PT SHORT TERM GOAL #1   Title independent with initial HEP including scar massage and perineal massage    Time 4    Period Weeks    Status Achieved    Target Date 05/11/20             PT Long Term Goals - 04/13/20 1459      PT LONG TERM GOAL #1   Title independent with advanced HEP    Time 12    Period Weeks    Status New    Target Date 07/06/20      PT LONG TERM GOAL #2   Title able to have penile penetration vaginally with pain level </= 0-1/10 due to reduction of scar tissue and improve tissue mobitliy    Time 12    Period Weeks    Status New    Target Date 07/06/20      PT LONG TERM GOAL #3   Title able  to perform daily activities with back pain </= 0-1/10 due to improve mobility    Time 12    Period Weeks    Status New    Target Date 07/06/20      PT LONG TERM GOAL #4   Title able to have a bowel movement without straining due to reduction of pelvic floor tightness and elongation with breath    Time 12    Period Weeks    Status New    Target Date 07/06/20                 Plan - 05/04/20 1226    Clinical Impression Statement Patient has increased lumbar pain today so treatment has focused on the back pain. Patient has tightness in the lumbar paraspinals. She responded well to the dry needling. Patient pain level decreased to 3/10 after the manual work and dry needling. Patientis doing her HEP. Patient lumbar ROM is full but painful. Patient will benefit from skilled therapy to reduce pain , reduce trigger points, in the muscls and improve tissue mobility.    Personal Factors and Comorbidities Comorbidity 2;Sex    Comorbidities right hernia repair; right ovarian cyst removed    Examination-Participation Restrictions Interpersonal Relationship;Community Activity    Stability/Clinical Decision Making Stable/Uncomplicated    Rehab Potential Excellent    PT Frequency 1x / week    PT Duration 12 weeks    PT Treatment/Interventions ADLs/Self Care Home Management;Biofeedback;Cryotherapy;Electrical Stimulation;Iontophoresis 4mg /ml Dexamethasone;Moist Heat;Ultrasound;Neuromuscular re-education;Therapeutic exercise;Therapeutic activities;Patient/family education;Manual techniques;Dry needling;Scar mobilization;Spinal Manipulations    PT Next Visit Plan internal manual work and educate patient; manual work from the back to the abdomen, see how the dry needling did;  review diaphgramatic breathing    PT Home Exercise Plan Access Code:    Consulted and Agree with Plan of Care Patient           Patient will benefit from skilled therapeutic intervention in order to improve the  following deficits and impairments:  Decreased coordination,Decreased range of motion,Increased fascial restricitons,Decreased endurance,Increased muscle spasms,Pain,Decreased activity tolerance,Decreased scar mobility,Decreased strength  Visit Diagnosis: Muscle weakness (generalized)  Other lack of coordination  Right lower quadrant abdominal pain     Problem List There are no problems to display for this patient.   Eulis Foster, PT 05/04/20 12:31 PM   [facility] [address] [place], [place], [address] Phone: [phone]   Fax:  [phone]  Name: Crystal Bentley MRN: [id number] Date of Birth: 04/06/1995

## 2020-05-13 ENCOUNTER — Encounter: Payer: Self-pay | Admitting: Physical Therapy

## 2020-05-13 ENCOUNTER — Other Ambulatory Visit: Payer: Self-pay

## 2020-05-13 ENCOUNTER — Ambulatory Visit: Payer: Medicaid Other | Attending: Obstetrics and Gynecology | Admitting: Physical Therapy

## 2020-05-13 DIAGNOSIS — R1031 Right lower quadrant pain: Secondary | ICD-10-CM | POA: Insufficient documentation

## 2020-05-13 DIAGNOSIS — M6281 Muscle weakness (generalized): Secondary | ICD-10-CM | POA: Insufficient documentation

## 2020-05-13 DIAGNOSIS — R278 Other lack of coordination: Secondary | ICD-10-CM

## 2020-05-13 NOTE — Therapy (Signed)
[facility] [address], [address] [place], [place], [address] Phone: [phone]   Fax:  [phone]  Physical Therapy Treatment  Patient Details  Name: Crystal Bentley MRN: [id number] Date of Birth: Jul 10, 1995 Referring Provider (PT): Dr. Catalina Antigua   Encounter Date: 05/13/2020   PT End of Session - 05/13/20 0938    Visit Number 3    Date for PT Re-Evaluation 07/06/20    Authorization Type BCBS    PT Start Time [phone]   came late and went to the bathroom   PT Stop Time 0955    PT Time Calculation (min) 17 min    Activity Tolerance Patient tolerated treatment well    Behavior During Therapy [facility] for tasks assessed/performed           History reviewed. No pertinent past medical history.  Past Surgical History:  Procedure Laterality Date  . INGUINAL HERNIA REPAIR     4 yrs   . OVARIAN CYST REMOVAL     6 yrs     There were no vitals filed for this visit.   Subjective Assessment - 05/13/20 0938    Subjective I am pregnant. I did not have pain with sex. I do not have as much pelvic pain. I just found out today I am prenant.    Patient Stated Goals reduce pain    Currently in Pain? Yes    Pain Score 4     Pain Location Abdomen    Pain Orientation Right    Pain Descriptors / Indicators Cramping    Pain Type Acute pain    Pain Onset More than a month ago    Pain Frequency Intermittent    Aggravating Factors  just happens    Pain Relieving Factors relax on the floor in prone    Multiple Pain Sites No                                       PT Short Term Goals - 05/04/20 1223      PT SHORT TERM GOAL #1   Title independent with initial HEP including scar massage and perineal massage    Time 4    Period Weeks    Status Achieved    Target Date 05/11/20             PT Long Term Goals - 04/13/20 1459      PT LONG TERM GOAL #1   Title independent with advanced HEP    Time 12     Period Weeks    Status New    Target Date 07/06/20      PT LONG TERM GOAL #2   Title able to have penile penetration vaginally with pain level </= 0-1/10 due to reduction of scar tissue and improve tissue mobitliy    Time 12    Period Weeks    Status New    Target Date 07/06/20      PT LONG TERM GOAL #3   Title able to perform daily activities with back pain </= 0-1/10 due to improve mobility    Time 12    Period Weeks    Status New    Target Date 07/06/20      PT LONG TERM GOAL #4   Title able to have a bowel movement without straining due to reduction of pelvic floor tightness and elongation with breath  Time 12    Period Weeks    Status New    Target Date 07/06/20                 Plan - 05/13/20 1000    Clinical Impression Statement Patient came to therapy letting the therapist know she just found out she was pregnant and only 4 days late with her period. Patient has not gone to the doctor. She reports she is not having pain with intercourse. She still has her abdominal pain but iti is not as much. Therapist let her know that she will have to go to the doctor to confirm she is pregnant and establish care. Therapist felt it would be better if she waited a few week before resuming therapy due to her being pregnant a few weeks. Therapy will resume on 06/10/2020.    Personal Factors and Comorbidities Comorbidity 2;Sex    Comorbidities right hernia repair; right ovarian cyst removed    Examination-Participation Restrictions Interpersonal Relationship;Community Activity    Stability/Clinical Decision Making Stable/Uncomplicated    Rehab Potential Excellent    PT Frequency 1x / week    PT Duration 12 weeks    PT Treatment/Interventions ADLs/Self Care Home Management;Biofeedback;Cryotherapy;Electrical Stimulation;Iontophoresis 4mg /ml Dexamethasone;Moist Heat;Ultrasound;Neuromuscular re-education;Therapeutic exercise;Therapeutic activities;Patient/family education;Manual  techniques;Dry needling;Scar mobilization;Spinal Manipulations    PT Next Visit Plan see patient on 06/10/2020 and reassess due to being pregnant    PT Home Exercise Plan Access Code: 08/10/2020    Consulted and Agree with Plan of Care Patient           Patient will benefit from skilled therapeutic intervention in order to improve the following deficits and impairments:  Decreased coordination,Decreased range of motion,Increased fascial restricitons,Decreased endurance,Increased muscle spasms,Pain,Decreased activity tolerance,Decreased scar mobility,Decreased strength  Visit Diagnosis: Muscle weakness (generalized)  Other lack of coordination  Right lower quadrant abdominal pain     Problem List There are no problems to display for this patient.   [email or web address], PT 05/13/20 10:04 AM   [facility] [address] [place], [place], [place] Phone: [phone]   Fax:  [phone]  Name: Crystal Bentley MRN: Earle Gell Date of Birth: 30-Sep-1995

## 2020-05-18 ENCOUNTER — Other Ambulatory Visit: Payer: Self-pay

## 2020-05-18 ENCOUNTER — Ambulatory Visit: Payer: Self-pay

## 2020-05-18 DIAGNOSIS — O219 Vomiting of pregnancy, unspecified: Secondary | ICD-10-CM

## 2020-05-18 DIAGNOSIS — Z3481 Encounter for supervision of other normal pregnancy, first trimester: Secondary | ICD-10-CM

## 2020-05-18 DIAGNOSIS — N912 Amenorrhea, unspecified: Secondary | ICD-10-CM

## 2020-05-18 LAB — POCT URINE PREGNANCY: Preg Test, Ur: POSITIVE — AB

## 2020-05-18 MED ORDER — PREPLUS 27-1 MG PO TABS: 1.0000 | ORAL_TABLET | Freq: Every day | ORAL | 12 refills | Status: DC

## 2020-05-18 MED ORDER — PROMETHAZINE HCL 25 MG PO TABS: 25.0000 mg | ORAL_TABLET | Freq: Four times a day (QID) | ORAL | 2 refills | Status: DC | PRN

## 2020-05-18 NOTE — Progress Notes (Signed)
Crystal Bentley presents today for UPT. Pt c/o nausea and vomiting. LMP: 04/11/20    OBJECTIVE: Appears well, in no apparent distress.  OB History    Gravida  1   Para  0   Term  0   Preterm  0   AB  0   Living  0     SAB  0   IAB  0   Ectopic  0   Multiple  0   Live Births  0          Home UPT Result: positive  In-Office UPT result:positive  I have reviewed the patient's medical, obstetrical, social, and family histories, and medications.   ASSESSMENT: Positive pregnancy test  PLAN Prenatal care to be completed at:  PNV and Phenergan rx sent to the pharmacy

## 2020-05-19 NOTE — Progress Notes (Signed)
Patient was assessed and managed by nursing staff during this encounter. I have reviewed the chart and agree with the documentation and plan. I have also made any necessary editorial changes.  Catalina Antigua, MD 05/19/2020 3:24 PM

## 2020-05-20 ENCOUNTER — Encounter: Payer: BC Managed Care – PPO | Admitting: Physical Therapy

## 2020-05-23 ENCOUNTER — Encounter (HOSPITAL_COMMUNITY): Payer: Self-pay

## 2020-05-23 ENCOUNTER — Emergency Department (HOSPITAL_COMMUNITY)
Admission: EM | Admit: 2020-05-23 | Discharge: 2020-05-23 | Disposition: A | Discharge status: Home / Self Care | Payer: Medicaid Other | Attending: Emergency Medicine | Admitting: Emergency Medicine

## 2020-05-23 ENCOUNTER — Other Ambulatory Visit: Payer: Self-pay

## 2020-05-23 DIAGNOSIS — O210 Mild hyperemesis gravidarum: Secondary | ICD-10-CM | POA: Diagnosis not present

## 2020-05-23 DIAGNOSIS — Z3A22 22 weeks gestation of pregnancy: Secondary | ICD-10-CM | POA: Insufficient documentation

## 2020-05-23 DIAGNOSIS — O219 Vomiting of pregnancy, unspecified: Secondary | ICD-10-CM

## 2020-05-23 DIAGNOSIS — Z3A01 Less than 8 weeks gestation of pregnancy: Secondary | ICD-10-CM | POA: Diagnosis not present

## 2020-05-23 LAB — CBC WITH DIFFERENTIAL/PLATELET
Abs Immature Granulocytes: 0.02 10*3/uL (ref 0.00–0.07)
Basophils Absolute: 0 10*3/uL (ref 0.0–0.1)
Basophils Relative: 0 %
Eosinophils Absolute: 0 10*3/uL (ref 0.0–0.5)
Eosinophils Relative: 0 %
HCT: 36.4 % (ref 36.0–46.0)
Hemoglobin: 11.9 g/dL — ABNORMAL LOW (ref 12.0–15.0)
Immature Granulocytes: 0 %
Lymphocytes Relative: 10 %
Lymphs Abs: 0.8 10*3/uL (ref 0.7–4.0)
MCH: 27.7 pg (ref 26.0–34.0)
MCHC: 32.7 g/dL (ref 30.0–36.0)
MCV: 84.7 fL (ref 80.0–100.0)
Monocytes Absolute: 0.2 10*3/uL (ref 0.1–1.0)
Monocytes Relative: 2 %
Neutro Abs: 7.4 10*3/uL (ref 1.7–7.7)
Neutrophils Relative %: 88 %
Platelets: 269 10*3/uL (ref 150–400)
RBC: 4.3 MIL/uL (ref 3.87–5.11)
RDW: 11.9 % (ref 11.5–15.5)
WBC: 8.5 10*3/uL (ref 4.0–10.5)
nRBC: 0 % (ref 0.0–0.2)

## 2020-05-23 LAB — BASIC METABOLIC PANEL
Anion gap: 9 (ref 5–15)
BUN: <5 mg/dL — ABNORMAL LOW (ref 6–20)
CO2: 21 mmol/L — ABNORMAL LOW (ref 22–32)
Calcium: 9.2 mg/dL (ref 8.9–10.3)
Chloride: 105 mmol/L (ref 98–111)
Creatinine, Ser: 0.62 mg/dL (ref 0.44–1.00)
GFR, Estimated: >60 mL/min (ref >60)
Glucose, Bld: 101 mg/dL — ABNORMAL HIGH (ref 70–99)
Potassium: 3.6 mmol/L (ref 3.5–5.1)
Sodium: 135 mmol/L (ref 135–145)

## 2020-05-23 LAB — URINALYSIS, ROUTINE W REFLEX MICROSCOPIC
Bilirubin Urine: NEGATIVE
Glucose, UA: 50 mg/dL — AB
Hgb urine dipstick: NEGATIVE
Ketones, ur: 80 mg/dL — AB
Leukocytes,Ua: NEGATIVE
Nitrite: NEGATIVE
Protein, ur: 30 mg/dL — AB
Specific Gravity, Urine: 1.019 (ref 1.005–1.030)
pH: 5 (ref 5.0–8.0)

## 2020-05-23 LAB — I-STAT BETA HCG BLOOD, ED (MC, WL, AP ONLY): I-stat hCG, quantitative: >2000 m[IU]/mL — ABNORMAL HIGH (ref <5)

## 2020-05-23 MED ORDER — SODIUM CHLORIDE 0.9 % IV BOLUS
1000.0000 mL | Freq: Once | INTRAVENOUS | Status: AC
  Administered 2020-05-23: 1000 mL via INTRAVENOUS

## 2020-05-23 MED ORDER — ONDANSETRON HCL 4 MG/2ML IJ SOLN
4.0000 mg | Freq: Once | INTRAMUSCULAR | Status: AC
  Administered 2020-05-23: 4 mg via INTRAVENOUS
  Filled 2020-05-23: qty 2

## 2020-05-23 MED ORDER — ONDANSETRON HCL 4 MG PO TABS: 4.0000 mg | ORAL_TABLET | Freq: Four times a day (QID) | ORAL | 1 refills | Status: DC

## 2020-05-23 NOTE — Discharge Instructions (Signed)
Please return for any problem.  °

## 2020-05-23 NOTE — ED Triage Notes (Addendum)
Patient arrived stating she is roughly [redacted] weeks pregnant and has had nausea and vomiting. Reports her OBGYN gave her medication last week but is still vomiting, last dose this morning. Declines any vaginal bleeding or cramping

## 2020-05-23 NOTE — ED Provider Notes (Signed)
[facility]-EMERGENCY DEPT Provider Note   CSN: [id number] Arrival date & time: 05/23/20  1859     History Chief Complaint  Patient presents with  . Morning Sickness    Crystal Bentley is a 25 y.o. female.  25 year old female with prior medical history as detailed below presents for evaluation.  Patient with newly diagnosed pregnancy and associated nausea and vomiting.  Patient has been using Phenergan for the last week with minimal improvement in her symptoms.  Patient is approximately [redacted] weeks pregnant for report.  She denies bloody emesis.  She denies fever.  She complains of persistent vomiting that has been worse over the last 12 hours.  She denies diarrhea or abdominal pain.  The history is provided by the patient and medical records.  Emesis Severity:  Moderate Duration:  1 week Timing:  Intermittent Progression:  Unchanged Chronicity:  New Relieved by:  Nothing Worsened by:  Nothing      History reviewed. No pertinent past medical history.  There are no problems to display for this patient.   Past Surgical History:  Procedure Laterality Date  . INGUINAL HERNIA REPAIR     5 yrs   . OVARIAN CYST REMOVAL     6 yrs      OB History    Gravida  1   Para  0   Term  0   Preterm  0   AB  0   Living  0     SAB  0   IAB  0   Ectopic  0   Multiple  0   Live Births  0           Family History  Problem Relation Age of Onset  . Stroke Father     Social History   Tobacco Use  . Smoking status: Never Smoker  . Smokeless tobacco: Never Used  Substance Use Topics  . Alcohol use: Never  . Drug use: Never    Home Medications Prior to Admission medications   Medication Sig Start Date End Date Taking? Authorizing Provider  OVER THE COUNTER MEDICATION Take 1 tablet by mouth as needed (uti infection). Patient not taking: Reported on 05/18/2020    [provider]  Prenatal Vit-Fe Fumarate-FA (PREPLUS) 27-1 MG  TABS Take 1 tablet by mouth daily. 05/18/20   Constant, Peggy, MD  promethazine (PHENERGAN) 25 MG tablet Take 1 tablet (25 mg total) by mouth every 6 (six) hours as needed for nausea or vomiting. 05/18/20   Constant, Gigi Gin, MD    Allergies    Patient has no known allergies.  Review of Systems   Review of Systems  Gastrointestinal: Positive for vomiting.  All other systems reviewed and are negative.   Physical Exam Updated Vital Signs BP 131/76 (BP Location: Right Arm)   Pulse (!) 105   Temp 98.6 F (37 C) (Oral)   Resp 18   LMP 04/11/2020   SpO2 100%   Physical Exam Vitals and nursing note reviewed.  Constitutional:      General: She is not in acute distress.    Appearance: Normal appearance. She is well-developed.  HENT:     Head: Normocephalic and atraumatic.  Eyes:     Conjunctiva/sclera: Conjunctivae normal.     Pupils: Pupils are equal, round, and reactive to light.  Cardiovascular:     Rate and Rhythm: Normal rate and regular rhythm.     Heart sounds: Normal heart sounds.  Pulmonary:  Effort: Pulmonary effort is normal. No respiratory distress.     Breath sounds: Normal breath sounds.  Abdominal:     General: There is no distension.     Palpations: Abdomen is soft.     Tenderness: There is no abdominal tenderness.  Musculoskeletal:        General: No deformity. Normal range of motion.     Cervical back: Normal range of motion and neck supple.  Skin:    General: Skin is warm and dry.  Neurological:     General: No focal deficit present.     Mental Status: She is alert and oriented to person, place, and time. Mental status is at baseline.     ED Results / Procedures / Treatments   Labs (all labs ordered are listed, but only abnormal results are displayed) Labs Reviewed  CBC WITH DIFFERENTIAL/PLATELET - Abnormal; Notable for the following components:      Result Value   Hemoglobin 11.9 (*)    All other components within normal limits  URINALYSIS,  ROUTINE W REFLEX MICROSCOPIC - Abnormal; Notable for the following components:   Glucose, UA 50 (*)    Ketones, ur 80 (*)    Protein, ur 30 (*)    Bacteria, UA RARE (*)    All other components within normal limits  I-STAT BETA HCG BLOOD, ED ([facility], [facility], [facility] ONLY) - Abnormal; Notable for the following components:   I-stat hCG, quantitative >2,000.0 (*)    All other components within normal limits  BASIC METABOLIC PANEL    EKG None  Radiology No results found.  Procedures Procedures   Medications Ordered in ED Medications  sodium chloride 0.9 % bolus 1,000 mL (has no administration in time range)  ondansetron (ZOFRAN) injection 4 mg (has no administration in time range)    ED Course  I have reviewed the triage vital signs and the nursing notes.  Pertinent labs & imaging results that were available during my care of the patient were reviewed by me and considered in my medical decision making (see chart for details).    MDM Rules/Calculators/A&P                          MDM  MSE complete  Crystal Bentley was evaluated in Emergency Department on 05/23/2020 for the symptoms described in the history of present illness. She was evaluated in the context of the global COVID-19 pandemic, which necessitated consideration that the patient might be at risk for infection with the SARS-CoV-2 virus that causes COVID-19. Institutional protocols and algorithms that pertain to the evaluation of patients at risk for COVID-19 are in a state of rapid change based on information released by regulatory bodies including the CDC and federal and state organizations. These policies and algorithms were followed during the patient's care in the ED.   Patient is presenting with nausea and vomiting associated with early pregnancy.   She improved with IVF and zofran.  Screening labs obtained are without significant abnormality.   She and her husband at bedside understand need for close FU.   Strict  return precautions given and understood.    Final Clinical Impression(s) / ED Diagnoses Final diagnoses:  Nausea and vomiting during pregnancy prior to [redacted] weeks gestation    Rx / DC Orders ED Discharge Orders         Ordered    ondansetron (ZOFRAN) 4 MG tablet  Every 6 hours  05/23/20 2301           Wynetta Fines, MD 05/23/20 [phone]

## 2020-05-23 NOTE — ED Notes (Signed)
Discharged with boyfriend.

## 2020-05-23 NOTE — ED Triage Notes (Signed)
Emergency Medicine Provider Triage Evaluation Note  Crystal Bentley , a 25 y.o. female  was evaluated in triage.  Pt complains of nausea and vomiting.  Patient reports she has had the symptoms all throughout last week.  Patient reports she received medication from her OB/GYN last week which did improve her symptoms however she took her last dose this morning.  Patient endorses epigastric abdominal pain and fatigue.  Reports LMP 04/11/2020.  Review of Systems  Positive: Nausea, vomiting, epigastric pain, fatigue Negative: Vaginal bleeding, fevers, chills  Physical Exam  BP 131/76 (BP Location: Right Arm)   Pulse (!) 105   Temp 98.6 F (37 C) (Oral)   Resp 18   LMP 04/11/2020   SpO2 100%  Gen:   Awake, no distress   HEENT:  Atraumatic  Resp:  Normal effort  Cardiac:  Normal rate  Abd:   Nondistended, nontender  MSK:   Moves extremities without difficulty  Neuro:  Speech clear   Medical Decision Making  Medically screening exam initiated at 7:39 PM.  Appropriate orders placed.  Crystal Bentley was informed that the remainder of the evaluation will be completed by another provider, this initial triage assessment does not replace that evaluation, and the importance of remaining in the ED until their evaluation is complete.  Clinical Impression   The patient appears stable so that the remainder of the work up may be completed by another provider.      Haskel Schroeder, [place] 05/24/20 [phone]

## 2020-05-27 ENCOUNTER — Encounter: Payer: BC Managed Care – PPO | Admitting: Physical Therapy

## 2020-05-28 ENCOUNTER — Telehealth: Payer: Self-pay | Admitting: *Deleted

## 2020-05-28 NOTE — Telephone Encounter (Signed)
Pt called to office stating she has some questions.  Call placed to pt to discuss.  Pt states she has a HA that started yesterday.  Pt advised to Take 2 extra strength Tylenol every 6 hours, stay well hydrated and try to eat health diet.  Pt has had problems with N&V, has phenergan Rx.  Pt is cash pay at this time and Diclegis will be too expensive to send.  Advised pt once she is approved for MCD she may be able to get other Rx.  Pt states it is hard for her to work with medication as she works nights.   Pt advised to discuss work shift changes with her employer, maybe she can try another shift.    Pt advised to contact office or be seen at hospital if symptoms worsen or do not improve.   Pt states understanding.

## 2020-06-03 ENCOUNTER — Encounter: Payer: BC Managed Care – PPO | Admitting: Physical Therapy

## 2020-06-03 ENCOUNTER — Other Ambulatory Visit: Payer: Self-pay

## 2020-06-03 ENCOUNTER — Emergency Department (HOSPITAL_COMMUNITY): Payer: Medicaid Other

## 2020-06-03 ENCOUNTER — Encounter (HOSPITAL_COMMUNITY): Payer: Self-pay

## 2020-06-03 ENCOUNTER — Emergency Department (HOSPITAL_COMMUNITY)
Admission: EM | Admit: 2020-06-03 | Discharge: 2020-06-04 | Disposition: A | Discharge status: Home / Self Care | Payer: Medicaid Other | Attending: Emergency Medicine | Admitting: Emergency Medicine

## 2020-06-03 DIAGNOSIS — O26891 Other specified pregnancy related conditions, first trimester: Secondary | ICD-10-CM | POA: Insufficient documentation

## 2020-06-03 DIAGNOSIS — O219 Vomiting of pregnancy, unspecified: Secondary | ICD-10-CM | POA: Insufficient documentation

## 2020-06-03 DIAGNOSIS — O208 Other hemorrhage in early pregnancy: Secondary | ICD-10-CM | POA: Diagnosis not present

## 2020-06-03 DIAGNOSIS — R531 Weakness: Secondary | ICD-10-CM | POA: Diagnosis not present

## 2020-06-03 DIAGNOSIS — Z3A08 8 weeks gestation of pregnancy: Secondary | ICD-10-CM | POA: Insufficient documentation

## 2020-06-03 DIAGNOSIS — R109 Unspecified abdominal pain: Secondary | ICD-10-CM | POA: Diagnosis not present

## 2020-06-03 DIAGNOSIS — E876 Hypokalemia: Secondary | ICD-10-CM | POA: Diagnosis not present

## 2020-06-03 DIAGNOSIS — R519 Headache, unspecified: Secondary | ICD-10-CM | POA: Diagnosis not present

## 2020-06-03 DIAGNOSIS — O99281 Endocrine, nutritional and metabolic diseases complicating pregnancy, first trimester: Secondary | ICD-10-CM | POA: Diagnosis not present

## 2020-06-03 MED ORDER — ONDANSETRON HCL 4 MG/2ML IJ SOLN
4.0000 mg | Freq: Once | INTRAMUSCULAR | Status: AC
  Administered 2020-06-03: 4 mg via INTRAVENOUS
  Filled 2020-06-03: qty 2

## 2020-06-03 MED ORDER — SODIUM CHLORIDE 0.9 % IV BOLUS
1000.0000 mL | Freq: Once | INTRAVENOUS | Status: AC
  Administered 2020-06-03: 1000 mL via INTRAVENOUS

## 2020-06-03 NOTE — ED Triage Notes (Signed)
Emergency Medicine Provider Triage Evaluation Note  Crystal Bentley , a 25 y.o. female  was evaluated in triage.  Pt complains of emesis.  Review of Systems  Positive: abd pain, headache, persistent vomiting Negative: Fever, vaginal bleeding, vaginal discharge  Physical Exam  BP (!) 137/97 (BP Location: Left Arm)   Pulse 97   Temp 98.2 F (36.8 C) (Oral)   Resp 18   LMP 04/11/2020   SpO2 100%  Gen:   Appears uncomfortable HEENT:  Atraumatic  Resp:  Normal effort  Cardiac:  Normal rate  Abd:   Diffused tenderness MSK:   Moves extremities without difficulty Neuro:  moaning  Medical Decision Making  Medically screening exam initiated at 11:00 PM.  Appropriate orders placed.  Crystal Bentley was informed that the remainder of the evaluation will be completed by another provider, this initial triage assessment does not replace that evaluation, and the importance of remaining in the ED until their evaluation is complete.  Clinical Impression  G1P0 currently [redacted] weeks pregnant here with persistent headache, vomiting and now abd pain for the past 1-2 weeks. No formal ultrasound to confirm IUP yet.    Crystal Helper, PA-C 06/03/20 2302

## 2020-06-03 NOTE — ED Triage Notes (Signed)
Pt reports [redacted] weeks pregnant. Vomiting has been on and off throughout pregnancy worsening 2 days ago. Given zofran Rx which works sometimes and others it doesn't.

## 2020-06-03 NOTE — ED Notes (Addendum)
Pt actively vomiting at this moment.  Ultrasound at bedside

## 2020-06-04 LAB — CBC WITH DIFFERENTIAL/PLATELET
Abs Immature Granulocytes: 0.03 10*3/uL (ref 0.00–0.07)
Basophils Absolute: 0 10*3/uL (ref 0.0–0.1)
Basophils Relative: 0 %
Eosinophils Absolute: 0 10*3/uL (ref 0.0–0.5)
Eosinophils Relative: 0 %
HCT: 36.6 % (ref 36.0–46.0)
Hemoglobin: 12.3 g/dL (ref 12.0–15.0)
Immature Granulocytes: 0 %
Lymphocytes Relative: 19 %
Lymphs Abs: 1.9 10*3/uL (ref 0.7–4.0)
MCH: 27.7 pg (ref 26.0–34.0)
MCHC: 33.6 g/dL (ref 30.0–36.0)
MCV: 82.4 fL (ref 80.0–100.0)
Monocytes Absolute: 0.4 10*3/uL (ref 0.1–1.0)
Monocytes Relative: 4 %
Neutro Abs: 7.5 10*3/uL (ref 1.7–7.7)
Neutrophils Relative %: 77 %
Platelets: 320 10*3/uL (ref 150–400)
RBC: 4.44 MIL/uL (ref 3.87–5.11)
RDW: 11.8 % (ref 11.5–15.5)
WBC: 9.9 10*3/uL (ref 4.0–10.5)
nRBC: 0 % (ref 0.0–0.2)

## 2020-06-04 LAB — COMPREHENSIVE METABOLIC PANEL
ALT: 49 U/L — ABNORMAL HIGH (ref 0–44)
AST: 30 U/L (ref 15–41)
Albumin: 4.4 g/dL (ref 3.5–5.0)
Alkaline Phosphatase: 43 U/L (ref 38–126)
Anion gap: 12 (ref 5–15)
BUN: <5 mg/dL — ABNORMAL LOW (ref 6–20)
CO2: 21 mmol/L — ABNORMAL LOW (ref 22–32)
Calcium: 9.5 mg/dL (ref 8.9–10.3)
Chloride: 105 mmol/L (ref 98–111)
Creatinine, Ser: 0.42 mg/dL — ABNORMAL LOW (ref 0.44–1.00)
GFR, Estimated: >60 mL/min (ref >60)
Glucose, Bld: 101 mg/dL — ABNORMAL HIGH (ref 70–99)
Potassium: 3.3 mmol/L — ABNORMAL LOW (ref 3.5–5.1)
Sodium: 138 mmol/L (ref 135–145)
Total Bilirubin: 1.3 mg/dL — ABNORMAL HIGH (ref 0.3–1.2)
Total Protein: 8.1 g/dL (ref 6.5–8.1)

## 2020-06-04 LAB — URINALYSIS, ROUTINE W REFLEX MICROSCOPIC
Bilirubin Urine: NEGATIVE
Glucose, UA: NEGATIVE mg/dL
Hgb urine dipstick: NEGATIVE
Ketones, ur: 80 mg/dL — AB
Leukocytes,Ua: NEGATIVE
Nitrite: NEGATIVE
Protein, ur: NEGATIVE mg/dL
Specific Gravity, Urine: 1.013 (ref 1.005–1.030)
pH: 6 (ref 5.0–8.0)

## 2020-06-04 LAB — HCG, QUANTITATIVE, PREGNANCY: hCG, Beta Chain, Quant, S: 179634 m[IU]/mL — ABNORMAL HIGH (ref <5)

## 2020-06-04 LAB — LIPASE, BLOOD: Lipase: 35 U/L (ref 11–51)

## 2020-06-04 MED ORDER — ACETAMINOPHEN 325 MG PO TABS
650.0000 mg | ORAL_TABLET | Freq: Once | ORAL | Status: AC
  Administered 2020-06-04: 650 mg via ORAL
  Filled 2020-06-04: qty 2

## 2020-06-04 MED ORDER — SODIUM CHLORIDE 0.9 % IV BOLUS
1000.0000 mL | Freq: Once | INTRAVENOUS | Status: AC
  Administered 2020-06-04: 1000 mL via INTRAVENOUS

## 2020-06-04 MED ORDER — PROMETHAZINE HCL 25 MG RE SUPP: 25.0000 mg | Freq: Four times a day (QID) | RECTAL | 0 refills | Status: DC | PRN

## 2020-06-04 MED ORDER — ONDANSETRON 4 MG PO TBDP: 4.0000 mg | ORAL_TABLET | Freq: Three times a day (TID) | ORAL | 0 refills | Status: DC | PRN

## 2020-06-04 NOTE — ED Provider Notes (Signed)
[facility] EMERGENCY DEPARTMENT Provider Note  CSN: [id number] Arrival date & time: 06/03/20 2245    History Chief Complaint  Patient presents with  . Emesis During Pregnancy    HPI  Crystal Bentley is a 25 y.o. female G1P0 approx [redacted] weeks pregnant has had persistent nausea and vomiting during this pregnancy. Seen in the ED on 4/16, labs neg, improved with Zofran and IVF and ultimately discharged. She has Zofran and Phenergan at home but still having nausea, headaches, general weakness and abdominal discomfort. No blood in emesis. She has not yet seen Ob/Gyn or had [place] with this pregnancy.    History reviewed. No pertinent past medical history.  Past Surgical History:  Procedure Laterality Date  . INGUINAL HERNIA REPAIR     5 yrs   . OVARIAN CYST REMOVAL     6 yrs     Family History  Problem Relation Age of Onset  . Stroke Father     Social History   Tobacco Use  . Smoking status: Never Smoker  . Smokeless tobacco: Never Used  Substance Use Topics  . Alcohol use: Never  . Drug use: Never     Home Medications Prior to Admission medications   Medication Sig Start Date End Date Taking? Authorizing Provider  ondansetron (ZOFRAN ODT) 4 MG disintegrating tablet Take 1 tablet (4 mg total) by mouth every 8 (eight) hours as needed for nausea or vomiting. 06/04/20  Yes Pollyann Savoy, MD  promethazine (PHENERGAN) 25 MG suppository Place 1 suppository (25 mg total) rectally every 6 (six) hours as needed for nausea or vomiting. 06/04/20  Yes Pollyann Savoy, MD  OVER THE COUNTER MEDICATION Take 1 tablet by mouth as needed (uti infection). Patient not taking: Reported on 05/18/2020    [provider]  Prenatal Vit-Fe Fumarate-FA (PREPLUS) 27-1 MG TABS Take 1 tablet by mouth daily. 05/18/20   Constant, Peggy, MD  promethazine (PHENERGAN) 25 MG tablet Take 1 tablet (25 mg total) by mouth every 6 (six) hours as needed for nausea or vomiting. 05/18/20   Constant,  Gigi Gin, MD     Allergies    Patient has no known allergies.   Review of Systems   Review of Systems A comprehensive review of systems was completed and negative except as noted in HPI.    Physical Exam BP 99/65   Pulse 90   Temp 98.2 F (36.8 C) (Oral)   Resp 15   Ht 5\' 6"  (1.676 m)   Wt 52 kg   LMP 04/11/2020   SpO2 99%   BMI 18.50 kg/m   Physical Exam Vitals and nursing note reviewed.  Constitutional:      Appearance: Normal appearance.  HENT:     Head: Normocephalic and atraumatic.     Nose: Nose normal.     Mouth/Throat:     Mouth: Mucous membranes are dry.  Eyes:     Extraocular Movements: Extraocular movements intact.     Conjunctiva/sclera: Conjunctivae normal.  Cardiovascular:     Rate and Rhythm: Normal rate.  Pulmonary:     Effort: Pulmonary effort is normal.     Breath sounds: Normal breath sounds.  Abdominal:     General: Abdomen is flat. There is no distension.     Palpations: Abdomen is soft.     Tenderness: There is abdominal tenderness (mild diffuse). There is no guarding.  Musculoskeletal:        General: No swelling. Normal range of motion.  Cervical back: Neck supple.  Skin:    General: Skin is warm and dry.  Neurological:     General: No focal deficit present.     Mental Status: She is alert.  Psychiatric:        Mood and Affect: Mood normal.      ED Results / Procedures / Treatments   Labs (all labs ordered are listed, but only abnormal results are displayed) Labs Reviewed  COMPREHENSIVE METABOLIC PANEL - Abnormal; Notable for the following components:      Result Value   Potassium 3.3 (*)    CO2 21 (*)    Glucose, Bld 101 (*)    BUN <5 (*)    Creatinine, Ser 0.42 (*)    ALT 49 (*)    Total Bilirubin 1.3 (*)    All other components within normal limits  URINALYSIS, ROUTINE W REFLEX MICROSCOPIC - Abnormal; Notable for the following components:   Ketones, ur 80 (*)    All other components within normal limits  HCG,  QUANTITATIVE, PREGNANCY - Abnormal; Notable for the following components:   hCG, Beta Chain, [place] Longest 099,833 (*)    All other components within normal limits  CBC WITH DIFFERENTIAL/PLATELET  LIPASE, BLOOD    EKG None  Radiology US OB Comp < 14 Wks  Result Date: 06/03/2020 CLINICAL DATA:  Abdominal pain EXAM: OBSTETRIC <14 WK ULTRASOUND TECHNIQUE: Transabdominal ultrasound was performed for evaluation of the gestation as well as the maternal uterus and adnexal regions. COMPARISON:  January 10, 2020 FINDINGS: Intrauterine gestational sac: Single Yolk sac:  Visualized. Embryo:  Visualized. Cardiac Activity: Visualized. Heart Rate: 178 bpm CRL:   1.9 mm   8 w 3 d                  [place] [facility]: January 10, 2021 Subchorionic hemorrhage:  Small volume. Maternal uterus/adnexae: 5 cm simple appearing right ovarian cyst. Left ovary is not visualized. Trace pelvic free fluid. IMPRESSION: Single viable intrauterine pregnancy, described above. Small volume subchorionic hemorrhage, recommend close interval follow-up with OB. Electronically Signed   By: Maudry Mayhew MD   On: 06/03/2020 23:44    Procedures Procedures  Medications Ordered in the ED Medications  sodium chloride 0.9 % bolus 1,000 mL (0 mLs Intravenous Stopped 06/04/20 0203)  ondansetron (ZOFRAN) injection 4 mg (4 mg Intravenous Given 06/03/20 2356)  acetaminophen (TYLENOL) tablet 650 mg (650 mg Oral Given 06/04/20 0048)  sodium chloride 0.9 % bolus 1,000 mL (0 mLs Intravenous Stopped 06/04/20 0203)     MDM Rules/Calculators/A&P MDM Patient with vomiting in first trimester. Will give IVF, antiemetics, check labs and [place] results ordered in triage.  ED Course  I have reviewed the triage vital signs and the nursing notes.  Pertinent labs & imaging results that were available during my care of the patient were reviewed by me and considered in my medical decision making (see chart for details).  Clinical Course as of 06/04/20 0245  Thu Jun 04, 2020  0113 CBC is normal. CMP with mild hypokalemia and increased bilirubin [CS]  0114 Lipase is normal. US shows IUP consistent with dates.  [CS]  0139 UA with ketones, consistent with frequent vomiting.  [CS]  0243 Patient feeling better, tolerating PO Fluids. Will d/c with Rx for ODT zofran and Phenergan suppositories. Recommend close Ob/Gyn follow up, preferably before her scheduled visit in 2 weeks. RTED for any other concerns.  [CS]    Clinical Course User Index [CS] Bernette Mayers,  Bonnita Levan, MD    Final Clinical Impression(s) / ED Diagnoses Final diagnoses:  Nausea and vomiting during pregnancy prior to [redacted] weeks gestation    Rx / DC Orders ED Discharge Orders         Ordered    ondansetron (ZOFRAN ODT) 4 MG disintegrating tablet  Every 8 hours PRN        06/04/20 0245    promethazine (PHENERGAN) 25 MG suppository  Every 6 hours PRN        06/04/20 0245           Pollyann Savoy, MD 06/04/20 [phone]

## 2020-06-04 NOTE — ED Notes (Signed)
Pt given water at this time for PO/fluid challenge.  

## 2020-06-04 NOTE — ED Notes (Signed)
Pt able to tolerate PO fluids at this time.  

## 2020-06-10 ENCOUNTER — Encounter: Payer: BC Managed Care – PPO | Admitting: Physical Therapy

## 2020-06-11 ENCOUNTER — Other Ambulatory Visit: Payer: Self-pay

## 2020-06-11 ENCOUNTER — Encounter (HOSPITAL_COMMUNITY): Payer: Self-pay | Admitting: Obstetrics & Gynecology

## 2020-06-11 ENCOUNTER — Inpatient Hospital Stay (HOSPITAL_COMMUNITY)
Admission: AD | Admit: 2020-06-11 | Discharge: 2020-06-12 | Disposition: A | Discharge status: Home / Self Care | Payer: Medicaid Other | Attending: Obstetrics & Gynecology | Admitting: Obstetrics & Gynecology

## 2020-06-11 ENCOUNTER — Emergency Department (HOSPITAL_COMMUNITY): Admission: EM | Admit: 2020-06-11 | Payer: Medicaid Other | Source: Home / Self Care

## 2020-06-11 DIAGNOSIS — O3481 Maternal care for other abnormalities of pelvic organs, first trimester: Secondary | ICD-10-CM | POA: Insufficient documentation

## 2020-06-11 DIAGNOSIS — R1084 Generalized abdominal pain: Secondary | ICD-10-CM

## 2020-06-11 DIAGNOSIS — O26891 Other specified pregnancy related conditions, first trimester: Secondary | ICD-10-CM | POA: Diagnosis not present

## 2020-06-11 DIAGNOSIS — O208 Other hemorrhage in early pregnancy: Secondary | ICD-10-CM | POA: Diagnosis present

## 2020-06-11 DIAGNOSIS — K5900 Constipation, unspecified: Secondary | ICD-10-CM | POA: Diagnosis not present

## 2020-06-11 DIAGNOSIS — Z3A08 8 weeks gestation of pregnancy: Secondary | ICD-10-CM | POA: Diagnosis not present

## 2020-06-11 DIAGNOSIS — O99611 Diseases of the digestive system complicating pregnancy, first trimester: Secondary | ICD-10-CM | POA: Insufficient documentation

## 2020-06-11 DIAGNOSIS — O99891 Other specified diseases and conditions complicating pregnancy: Secondary | ICD-10-CM | POA: Insufficient documentation

## 2020-06-11 DIAGNOSIS — Z79899 Other long term (current) drug therapy: Secondary | ICD-10-CM | POA: Insufficient documentation

## 2020-06-11 DIAGNOSIS — R109 Unspecified abdominal pain: Secondary | ICD-10-CM | POA: Diagnosis not present

## 2020-06-11 HISTORY — DX: Other specified health status: Z78.9

## 2020-06-11 LAB — URINALYSIS, ROUTINE W REFLEX MICROSCOPIC
Bilirubin Urine: NEGATIVE
Glucose, UA: NEGATIVE mg/dL
Hgb urine dipstick: NEGATIVE
Ketones, ur: 5 mg/dL — AB
Leukocytes,Ua: NEGATIVE
Nitrite: NEGATIVE
Protein, ur: NEGATIVE mg/dL
Specific Gravity, Urine: 1.017 (ref 1.005–1.030)
pH: 6 (ref 5.0–8.0)

## 2020-06-11 MED ORDER — POLYETHYLENE GLYCOL 3350 17 G PO PACK
17.0000 g | PACK | Freq: Once | ORAL | Status: AC
  Administered 2020-06-12: 17 g via ORAL
  Filled 2020-06-11: qty 1

## 2020-06-11 NOTE — MAU Provider Note (Signed)
Chief Complaint: Abdominal Pain and Back Pain   Event Date/Time   First Provider Initiated Contact with Patient 06/11/20 2233        SUBJECTIVE HPI: Crystal Bentley is a 25 y.o. G1P0000 at [redacted]w[redacted]d by LMP who presents to maternity admissions reporting pain all over abdomen which is squeezing in nature. Also sharp sometimes.  No bleeding. No vomiting.  Does report constipation, last BM 5 days ago.  Has not tried anything. She denies vaginal bleeding, vaginal itching/burning, urinary symptoms, h/a, dizziness, n/v, or fever/chills.    Abdominal Pain This is a new problem. The current episode started in the past 7 days. The problem occurs intermittently. The problem has been unchanged. The pain is located in the generalized abdominal region and LLQ. The quality of the pain is cramping and colicky (squeezing). The abdominal pain radiates to the back. Associated symptoms include constipation. Pertinent negatives include no anorexia, belching, diarrhea, frequency, myalgias, nausea or vomiting. Nothing aggravates the pain. The pain is relieved by nothing. She has tried nothing for the symptoms.   RN Note: Having pain in stomach like something is squeezing my stomach. Denies vag bleeding or d/c.    Past Medical History:  Diagnosis Date  . Medical history non-contributory    Past Surgical History:  Procedure Laterality Date  . INGUINAL HERNIA REPAIR     4 yrs   . OVARIAN CYST REMOVAL     6 yrs    Social History   Socioeconomic History  . Marital status: Single    Spouse name: Not on file  . Number of children: Not on file  . Years of education: Not on file  . Highest education level: Not on file  Occupational History  . Not on file  Tobacco Use  . Smoking status: Never Smoker  . Smokeless tobacco: Never Used  Substance and Sexual Activity  . Alcohol use: Never  . Drug use: Never  . Sexual activity: Yes    Birth control/protection: None  Other Topics Concern  . Not on file  Social  History Narrative  . Not on file   Social Determinants of Health   Financial Resource Strain: Not on file  Food Insecurity: Not on file  Transportation Needs: Not on file  Physical Activity: Not on file  Stress: Not on file  Social Connections: Not on file  Intimate Partner Violence: Not on file   No current facility-administered medications on file prior to encounter.   Current Outpatient Medications on File Prior to Encounter  Medication Sig Dispense Refill  . ondansetron (ZOFRAN ODT) 4 MG disintegrating tablet Take 1 tablet (4 mg total) by mouth every 8 (eight) hours as needed for nausea or vomiting. 20 tablet 0  . Prenatal Vit-Fe Fumarate-FA (PREPLUS) 27-1 MG TABS Take 1 tablet by mouth daily. 30 tablet 12  . promethazine (PHENERGAN) 25 MG suppository Place 1 suppository (25 mg total) rectally every 6 (six) hours as needed for nausea or vomiting. 12 each 0  . promethazine (PHENERGAN) 25 MG tablet Take 1 tablet (25 mg total) by mouth every 6 (six) hours as needed for nausea or vomiting. 30 tablet 2  . OVER THE COUNTER MEDICATION Take 1 tablet by mouth as needed (uti infection). (Patient not taking: Reported on 05/18/2020)     No Known Allergies  I have reviewed patient's Past Medical Hx, Surgical Hx, Family Hx, Social Hx, medications and allergies.   ROS:  Review of Systems  Gastrointestinal: Positive for constipation. Negative for anorexia, diarrhea,  nausea and vomiting.  Genitourinary: Negative for frequency.  Musculoskeletal: Negative for myalgias.   Review of Systems  Other systems negative   Physical Exam  Physical Exam Patient Vitals for the past 24 hrs:  BP Temp Pulse Resp Height Weight  06/11/20 2221 116/78 98.6 F (37 C) 91 16 5\' 3"  (1.6 m) 47.2 kg   Constitutional: Well-developed, well-nourished female in no acute distress.  Cardiovascular: normal rate Respiratory: normal effort GI: Abd soft, moderately tender throughout. Pos BS x 4  No guarding or  rebound MS: Extremities nontender, no edema, normal ROM Neurologic: Alert and oriented x 4.  GU: Neg CVAT.  PELVIC EXAM: deferred  LAB RESULTS Results for orders placed or performed during the hospital encounter of 06/11/20 (from the past 24 hour(s))  Urinalysis, Routine w reflex microscopic Urine, Clean Catch     Status: Abnormal   Collection Time: 06/11/20 10:35 PM  Result Value Ref Range   Color, Urine YELLOW YELLOW   APPearance HAZY (A) CLEAR   Specific Gravity, Urine 1.017 1.005 - 1.030   pH 6.0 5.0 - 8.0   Glucose, UA NEGATIVE NEGATIVE mg/dL   Hgb urine dipstick NEGATIVE NEGATIVE   Bilirubin Urine NEGATIVE NEGATIVE   Ketones, ur 5 (A) NEGATIVE mg/dL   Protein, ur NEGATIVE NEGATIVE mg/dL   Nitrite NEGATIVE NEGATIVE   Leukocytes,Ua NEGATIVE NEGATIVE  CBC     Status: Abnormal   Collection Time: 06/11/20 11:40 PM  Result Value Ref Range   WBC 9.6 4.0 - 10.5 K/uL   RBC 3.95 3.87 - 5.11 MIL/uL   Hemoglobin 11.0 (L) 12.0 - 15.0 g/dL   HCT 08/11/20 (L) 02.7 - 25.3 %   MCV 83.3 80.0 - 100.0 fL   MCH 27.8 26.0 - 34.0 pg   MCHC 33.4 30.0 - 36.0 g/dL   RDW 66.4 40.3 - 47.4 %   Platelets 331 150 - 400 K/uL   nRBC 0.0 0.0 - 0.2 %     IMAGING (done on 06/03/20) 06/05/20 OB Comp < 14 Wks  Result Date: 06/03/2020 CLINICAL DATA:  Abdominal pain EXAM: OBSTETRIC <14 WK ULTRASOUND TECHNIQUE: Transabdominal ultrasound was performed for evaluation of the gestation as well as the maternal uterus and adnexal regions. COMPARISON:  January 10, 2020 FINDINGS: Intrauterine gestational sac: Single Yolk sac:  Visualized. Embryo:  Visualized. Cardiac Activity: Visualized. Heart Rate: 178 bpm CRL:   1.9 mm   8 w 3 d                  January 12, 2020 [facility]: January 10, 2021 Subchorionic hemorrhage:  Small volume. Maternal uterus/adnexae: 5 cm simple appearing right ovarian cyst. Left ovary is not visualized. Trace pelvic free fluid. IMPRESSION: Single viable intrauterine pregnancy, described above. Small volume subchorionic  hemorrhage, recommend close interval follow-up with OB. Electronically Signed   By: January 12, 2021 MD   On: 06/03/2020 23:44   Bedside 06/05/2020 done by me for fetal viability Single fetus in normal appearing gestational sac Fetal heart rate 150s.   MAU Management/MDM: Ordered UA to rule out urinary source of pain, this is normal  Ordered CBC to evaluate for leukocytosis or anemia, these are normal  Given history, lab result and exam findings, pain seems consistent with history of constipation x 5days. Discussed use of Miralax and daily fiber.  Daily Miralax (one dose given here) until bowel evacuated, then prn.  Daily Fiber Con tablet with large glass of water for prevention.   ASSESSMENT Single IUP at [redacted]w[redacted]d Abdominal pain  Constipation  PLAN Discharge home Rx Miralax for constipation Rx Fiber Con for prevention of recurrent constipation Has new OB appt next week Pt stable at time of discharge. Encouraged to return here if she develops worsening of symptoms, increase in pain, fever, or other concerning symptoms.    Wynelle Bourgeois CNM, MSN Certified Nurse-Midwife 06/11/2020  10:33 PM

## 2020-06-11 NOTE — MAU Note (Signed)
Having pain in stomach like something is squeezing my stomach. Denies vag bleeding or d/c.

## 2020-06-12 DIAGNOSIS — K5900 Constipation, unspecified: Secondary | ICD-10-CM

## 2020-06-12 DIAGNOSIS — R109 Unspecified abdominal pain: Secondary | ICD-10-CM

## 2020-06-12 DIAGNOSIS — O26891 Other specified pregnancy related conditions, first trimester: Secondary | ICD-10-CM

## 2020-06-12 DIAGNOSIS — O99611 Diseases of the digestive system complicating pregnancy, first trimester: Secondary | ICD-10-CM

## 2020-06-12 DIAGNOSIS — Z3A08 8 weeks gestation of pregnancy: Secondary | ICD-10-CM

## 2020-06-12 LAB — CBC
HCT: 32.9 % — ABNORMAL LOW (ref 36.0–46.0)
Hemoglobin: 11 g/dL — ABNORMAL LOW (ref 12.0–15.0)
MCH: 27.8 pg (ref 26.0–34.0)
MCHC: 33.4 g/dL (ref 30.0–36.0)
MCV: 83.3 fL (ref 80.0–100.0)
Platelets: 331 10*3/uL (ref 150–400)
RBC: 3.95 MIL/uL (ref 3.87–5.11)
RDW: 11.9 % (ref 11.5–15.5)
WBC: 9.6 10*3/uL (ref 4.0–10.5)
nRBC: 0 % (ref 0.0–0.2)

## 2020-06-12 MED ORDER — FIBERCON 625 MG PO TABS: 625.0000 mg | ORAL_TABLET | Freq: Every day | ORAL | 1 refills | Status: DC

## 2020-06-12 MED ORDER — POLYETHYLENE GLYCOL 3350 17 G PO PACK: 17.0000 g | PACK | Freq: Every day | ORAL | 2 refills | Status: DC

## 2020-06-12 NOTE — Discharge Instructions (Signed)
Constipation, Adult Constipation is when a person has trouble pooping (having a bowel movement). When you have this condition, you may poop fewer than 3 times a week. Your poop (stool) may also be dry, hard, or bigger than normal. Follow these instructions at home: Eating and drinking  Eat foods that have a lot of fiber, such as: ? Fresh fruits and vegetables. ? Whole grains. ? Beans.  Eat less of foods that are low in fiber and high in fat and sugar, such as: ? French fries. ? Hamburgers. ? Cookies. ? Candy. ? Soda.  Drink enough fluid to keep your pee (urine) pale yellow.   General instructions  Exercise regularly or as told by your doctor. Try to do 150 minutes of exercise each week.  Go to the restroom when you feel like you need to poop. Do not hold it in.  Take over-the-counter and prescription medicines only as told by your doctor. These include any fiber supplements.  When you poop: ? Do deep breathing while relaxing your lower belly (abdomen). ? Relax your pelvic floor. The pelvic floor is a group of muscles that support the rectum, bladder, and intestines (as well as the uterus in women).  Watch your condition for any changes. Tell your doctor if you notice any.  Keep all follow-up visits as told by your doctor. This is important. Contact a doctor if:  You have pain that gets worse.  You have a fever.  You have not pooped for 4 days.  You vomit.  You are not hungry.  You lose weight.  You are bleeding from the opening of the butt (anus).  You have thin, pencil-like poop. Get help right away if:  You have a fever, and your symptoms suddenly get worse.  You leak poop or have blood in your poop.  Your belly feels hard or bigger than normal (bloated).  You have very bad belly pain.  You feel dizzy or you faint. Summary  Constipation is when a person poops fewer than 3 times a week, has trouble pooping, or has poop that is dry, hard, or bigger than  normal.  Eat foods that have a lot of fiber.  Drink enough fluid to keep your pee (urine) pale yellow.  Take over-the-counter and prescription medicines only as told by your doctor. These include any fiber supplements. This information is not intended to replace advice given to you by your health care provider. Make sure you discuss any questions you have with your health care provider. Document Revised: 12/12/2018 Document Reviewed: 12/12/2018 Elsevier Patient Education  2021 [employer].  

## 2020-06-12 NOTE — MAU Note (Signed)
Went in to give d/c instructions and pt had vomited most of the Miralax. Continues to have some abd pain and makes her head hurt when she vomits. Pt just wants to go home and will use her Phenergan suppository and go to bed.

## 2020-06-16 ENCOUNTER — Telehealth: Payer: Self-pay

## 2020-06-16 ENCOUNTER — Encounter (HOSPITAL_COMMUNITY): Payer: Self-pay | Admitting: Obstetrics and Gynecology

## 2020-06-16 ENCOUNTER — Other Ambulatory Visit: Payer: Self-pay

## 2020-06-16 ENCOUNTER — Inpatient Hospital Stay (HOSPITAL_COMMUNITY)
Admission: AD | Admit: 2020-06-16 | Discharge: 2020-06-16 | Disposition: A | Discharge status: Home / Self Care | Payer: Medicaid Other | Attending: Obstetrics and Gynecology | Admitting: Obstetrics and Gynecology

## 2020-06-16 DIAGNOSIS — O26891 Other specified pregnancy related conditions, first trimester: Secondary | ICD-10-CM | POA: Diagnosis not present

## 2020-06-16 DIAGNOSIS — R1084 Generalized abdominal pain: Secondary | ICD-10-CM | POA: Insufficient documentation

## 2020-06-16 DIAGNOSIS — O219 Vomiting of pregnancy, unspecified: Secondary | ICD-10-CM | POA: Diagnosis present

## 2020-06-16 DIAGNOSIS — M549 Dorsalgia, unspecified: Secondary | ICD-10-CM | POA: Insufficient documentation

## 2020-06-16 DIAGNOSIS — M25559 Pain in unspecified hip: Secondary | ICD-10-CM | POA: Diagnosis not present

## 2020-06-16 DIAGNOSIS — Z79899 Other long term (current) drug therapy: Secondary | ICD-10-CM | POA: Insufficient documentation

## 2020-06-16 DIAGNOSIS — Z3A09 9 weeks gestation of pregnancy: Secondary | ICD-10-CM

## 2020-06-16 LAB — CBC
HCT: 34.7 % — ABNORMAL LOW (ref 36.0–46.0)
Hemoglobin: 11.7 g/dL — ABNORMAL LOW (ref 12.0–15.0)
MCH: 28.3 pg (ref 26.0–34.0)
MCHC: 33.7 g/dL (ref 30.0–36.0)
MCV: 83.8 fL (ref 80.0–100.0)
Platelets: 359 10*3/uL (ref 150–400)
RBC: 4.14 MIL/uL (ref 3.87–5.11)
RDW: 12.1 % (ref 11.5–15.5)
WBC: 8.1 10*3/uL (ref 4.0–10.5)
nRBC: 0 % (ref 0.0–0.2)

## 2020-06-16 LAB — COMPREHENSIVE METABOLIC PANEL
ALT: 37 U/L (ref 0–44)
AST: 25 U/L (ref 15–41)
Albumin: 3.9 g/dL (ref 3.5–5.0)
Alkaline Phosphatase: 43 U/L (ref 38–126)
Anion gap: 8 (ref 5–15)
BUN: 5 mg/dL — ABNORMAL LOW (ref 6–20)
CO2: 21 mmol/L — ABNORMAL LOW (ref 22–32)
Calcium: 9.3 mg/dL (ref 8.9–10.3)
Chloride: 103 mmol/L (ref 98–111)
Creatinine, Ser: 0.53 mg/dL (ref 0.44–1.00)
GFR, Estimated: >60 mL/min (ref >60)
Glucose, Bld: 74 mg/dL (ref 70–99)
Potassium: 3.5 mmol/L (ref 3.5–5.1)
Sodium: 132 mmol/L — ABNORMAL LOW (ref 135–145)
Total Bilirubin: 1.2 mg/dL (ref 0.3–1.2)
Total Protein: 7.4 g/dL (ref 6.5–8.1)

## 2020-06-16 LAB — URINALYSIS, ROUTINE W REFLEX MICROSCOPIC
Bilirubin Urine: NEGATIVE
Glucose, UA: NEGATIVE mg/dL
Hgb urine dipstick: NEGATIVE
Ketones, ur: 80 mg/dL — AB
Leukocytes,Ua: NEGATIVE
Nitrite: NEGATIVE
Protein, ur: 30 mg/dL — AB
Specific Gravity, Urine: 1.023 (ref 1.005–1.030)
pH: 5 (ref 5.0–8.0)

## 2020-06-16 MED ORDER — PROMETHAZINE HCL 25 MG RE SUPP: 25.0000 mg | Freq: Four times a day (QID) | RECTAL | 0 refills | Status: DC | PRN

## 2020-06-16 MED ORDER — SODIUM CHLORIDE 0.9 % IV SOLN
25.0000 mg | Freq: Once | INTRAVENOUS | Status: AC
  Administered 2020-06-16: 25 mg via INTRAVENOUS
  Filled 2020-06-16: qty 1

## 2020-06-16 NOTE — Discharge Instructions (Signed)
Hyperemesis Gravidarum Hyperemesis gravidarum is a severe form of nausea and vomiting that happens during pregnancy. Hyperemesis is worse than morning sickness. It may cause you to have nausea or vomiting all day for many days. It may keep you from eating and drinking enough food and liquids, which can lead to dehydration, malnutrition, and weight loss. Hyperemesis usually occurs during the first half (the first 20 weeks) of pregnancy. It often goes away once a woman is in her second half of pregnancy. However, sometimes hyperemesis continues through an entire pregnancy. What are the causes? The cause of this condition is not known. It may be associated with:  Changes in hormones in the body during pregnancy.  Changes in the gastrointestinal system.  Genetic or inherited conditions. What are the signs or symptoms? Symptoms of this condition include:  Severe nausea and vomiting that does not go away.  Problems keeping food down.  Weight loss.  Loss of body fluid (dehydration).  Loss of appetite. You may have no desire to eat or you may not like the food you have previously enjoyed. How is this diagnosed? This condition may be diagnosed based on your medical history, your symptoms, and a physical exam. You may also have other tests, including:  Blood tests.  Urine tests.  Blood pressure tests.  Ultrasound to look for problems with the placenta or to check if you are pregnant with more than one baby. How is this treated? This condition is managed by controlling symptoms. This may include:  Following an eating plan. This can help to lessen nausea and vomiting.  Treatments that do not use medicine. These include acupressure bracelets, hypnosis, and eating or drinking foods or fluids that contain ginger, ginger ale, or ginger tea.  Taking prescription medicine or over-the-counter medicine as told by your health care provider.  Continuing to take prenatal vitamins. You may need to  change what kind you take and when you take them. Follow your health care provider's instructions about prenatal vitamins. An eating plan and medicines are often used together to help control symptoms. If medicines do not help relieve nausea and vomiting, you may need to receive fluids through an IV at the hospital. Follow these instructions at home: To help relieve your symptoms, listen to your body. Everyone is different and has different preferences. Find what works best for you. Here are some things you can try to help relieve your symptoms: Meals and snacks  Eat 5-6 small meals daily instead of 3 large meals. Eating small meals and snacks can help you avoid an empty stomach.  Before getting out of bed, eat a couple of crackers to avoid moving around on an empty stomach.  Eat a protein-rich snack before bed. Examples include cheese and crackers, or a peanut butter sandwich made with 1 slice of whole-wheat bread and 1 tsp (5 g) of peanut butter.  Eat and drink slowly.  Try eating starchy foods as these are usually tolerated well. Examples include cereal, toast, bread, potatoes, pasta, rice, and pretzels.  Eat at least one serving of protein with your meals and snacks. Protein options include lean meats, poultry, seafood, beans, nuts, nut butters, eggs, cheese, and yogurt.  Eat or suck on things that have ginger in them. It may help to relieve nausea. Add  tsp (0.44 g) ground ginger to hot tea, or choose ginger tea.   Fluids It is important to stay hydrated. Try to:  Drink small amounts of fluids often.  Drink fluids 30 minutes   before or after a meal to help lessen the feeling of a full stomach.  Drink 100% fruit juice or an electrolyte drink. An electrolyte drink contains sodium, potassium, and chloride.  Drink fluids that are cold, clear, and carbonated or sour. These include lemonade, ginger ale, lemon-lime soda, ice water, and sparkling water. Things to avoid Avoid the  following:  Eating foods that trigger your symptoms. These may include spicy foods, coffee, high-fat foods, very sweet foods, and acidic foods.  Drinking more than 1 cup of fluid at a time.  Skipping meals. Nausea can be more intense on an empty stomach. If you cannot tolerate food, do not force it. Try sucking on ice chips or other frozen items and make up for missed calories later.  Lying down within 2 hours after eating.  Being exposed to environmental triggers. These may include food smells, smoky rooms, closed spaces, rooms with strong smells, warm or humid places, overly loud and noisy rooms, and rooms with motion or flickering lights. Try eating meals in a well-ventilated area that is free of strong smells.  Making quick and sudden changes in your movement.  Taking iron pills and multivitamins that contain iron. If you take prescription iron pills, do not stop taking them unless your health care provider approves.  Preparing food. The smell of food can spoil your appetite or trigger nausea. General instructions  Brush your teeth or use a mouth rinse after meals.  Take over-the-counter and prescription medicines only as told by your health care provider.  Follow instructions from your health care provider about eating or drinking restrictions.  Talk with your health care provider about starting a supplement of vitamin B6.  Continue to take your prenatal vitamins as told by your health care provider. If you are having trouble taking your prenatal vitamins, talk with your health care provider about other options.  Keep all follow-up visits. This is important. Follow-up visits include prenatal visits. Contact a health care provider if:  You have pain in your abdomen.  You have a severe headache.  You have vision problems.  You are losing weight.  You feel weak or dizzy.  You cannot eat or drink without vomiting, especially if this goes on for a full day. Get help right  away if:  You cannot drink fluids without vomiting.  You vomit blood.  You have constant nausea and vomiting.  You are very weak.  You faint.  You have a fever and your symptoms suddenly get worse. Summary  Hyperemesis gravidarum is a severe form of nausea and vomiting that happens during pregnancy.  Making some changes to your eating habits may help relieve nausea and vomiting.  This condition may be managed with lifestyle changes and medicines as prescribed by your health care provider.  If medicines do not help relieve nausea and vomiting, you may need to receive fluids through an IV at the hospital. This information is not intended to replace advice given to you by your health care provider. Make sure you discuss any questions you have with your health care provider. Document Revised: 08/19/2019 Document Reviewed: 08/19/2019 Elsevier Patient Education  2021 Elsevier Inc.  

## 2020-06-16 NOTE — MAU Provider Note (Signed)
Chief Complaint:  Nausea and Emesis   Event Date/Time   First Provider Initiated Contact with Patient 06/16/20 1402     HPI: Crystal Bentley is a 25 y.o. G1P0000 at [redacted]w[redacted]d who presents to maternity admissions reporting nausea, vomiting, and generalized abdominal, back, and hip pain. Patient reports nausea/vomiting for the past 2 days. Takes Phenergan suppository which helps sometime. Last took this morning. She has not tried anything for her back/abdominal/hip pain. This has been ongoing. She denies vaginal bleeding or discharge.    Pregnancy Course:   Past Medical History:  Diagnosis Date  . Medical history non-contributory    OB History  Gravida Para Term Preterm AB Living  1 0 0 0 0 0  SAB IAB Ectopic Multiple Live Births  0 0 0 0 0    # Outcome Date GA Lbr Len/2nd Weight Sex Delivery Anes PTL Lv  1 Current            Past Surgical History:  Procedure Laterality Date  . INGUINAL HERNIA REPAIR     4 yrs   . OVARIAN CYST REMOVAL     6 yrs    Family History  Problem Relation Age of Onset  . Stroke Father    Social History   Tobacco Use  . Smoking status: Never Smoker  . Smokeless tobacco: Never Used  Substance Use Topics  . Alcohol use: Never  . Drug use: Never   No Known Allergies Medications Prior to Admission  Medication Sig Dispense Refill Last Dose  . ondansetron (ZOFRAN ODT) 4 MG disintegrating tablet Take 1 tablet (4 mg total) by mouth every 8 (eight) hours as needed for nausea or vomiting. 20 tablet 0 06/16/2020 at Unknown time  . OVER THE COUNTER MEDICATION Take 1 tablet by mouth as needed (uti infection). (Patient not taking: Reported on 05/18/2020)     . polycarbophil (FIBERCON) 625 MG tablet Take 1 tablet (625 mg total) by mouth daily. 30 tablet 1   . polyethylene glycol (MIRALAX) 17 g packet Take 17 g by mouth daily. 14 each 2 Unknown at Unknown time  . Prenatal Vit-Fe Fumarate-FA (PREPLUS) 27-1 MG TABS Take 1 tablet by mouth daily. 30 tablet 12 Unknown  at Unknown time  . promethazine (PHENERGAN) 25 MG suppository Place 1 suppository (25 mg total) rectally every 6 (six) hours as needed for nausea or vomiting. 12 each 0 Unknown at Unknown time  . promethazine (PHENERGAN) 25 MG tablet Take 1 tablet (25 mg total) by mouth every 6 (six) hours as needed for nausea or vomiting. 30 tablet 2 Unknown at Unknown time    I have reviewed patient's Past Medical Hx, Surgical Hx, Family Hx, Social Hx, medications and allergies.   ROS:  Review of Systems  Constitutional: Positive for appetite change.  HENT: Negative.   Respiratory: Negative.   Cardiovascular: Negative.   Gastrointestinal: Positive for abdominal pain, nausea and vomiting. Negative for diarrhea.  Genitourinary: Negative.   Musculoskeletal: Positive for back pain.  Neurological: Negative.   Psychiatric/Behavioral: Negative.     Physical Exam   Patient Vitals for the past 24 hrs:  BP Temp Temp src Pulse Resp SpO2 Height Weight  06/16/20 1332 106/77 -- -- 87 -- -- -- --  06/16/20 1306 111/84 98.2 F (36.8 C) Oral (!) 114 18 98 % -- --  06/16/20 1259 -- -- -- -- -- -- 5\' 3"  (1.6 m) 46.1 kg   Constitutional: well-developed, well-nourished female in no acute distress.  Cardiovascular: normal rate  Respiratory: normal effort GI: Abd soft, non-tender MS: extremities nontender, no edema, normal ROM Neurologic: alert and oriented x 4.  GU: Neg CVAT. Pelvic: deferred    Labs: Results for orders placed or performed during the hospital encounter of 06/16/20 (from the past 24 hour(s))  Urinalysis, Routine w reflex microscopic Urine, Clean Catch     Status: Abnormal   Collection Time: 06/16/20  1:21 PM  Result Value Ref Range   Color, Urine AMBER (A) YELLOW   APPearance HAZY (A) CLEAR   Specific Gravity, Urine 1.023 1.005 - 1.030   pH 5.0 5.0 - 8.0   Glucose, UA NEGATIVE NEGATIVE mg/dL   Hgb urine dipstick NEGATIVE NEGATIVE   Bilirubin Urine NEGATIVE NEGATIVE   Ketones, ur 80 (A)  NEGATIVE mg/dL   Protein, ur 30 (A) NEGATIVE mg/dL   Nitrite NEGATIVE NEGATIVE   Leukocytes,Ua NEGATIVE NEGATIVE   RBC / HPF 0-5 0 - 5 RBC/hpf   WBC, UA 0-5 0 - 5 WBC/hpf   Bacteria, UA RARE (A) NONE SEEN   Squamous Epithelial / LPF 0-5 0 - 5   Mucus PRESENT   CBC     Status: Abnormal   Collection Time: 06/16/20  2:24 PM  Result Value Ref Range   WBC 8.1 4.0 - 10.5 K/uL   RBC 4.14 3.87 - 5.11 MIL/uL   Hemoglobin 11.7 (L) 12.0 - 15.0 g/dL   HCT 98.3 (L) 38.2 - 50.5 %   MCV 83.8 80.0 - 100.0 fL   MCH 28.3 26.0 - 34.0 pg   MCHC 33.7 30.0 - 36.0 g/dL   RDW 39.7 67.3 - 41.9 %   Platelets 359 150 - 400 K/uL   nRBC 0.0 0.0 - 0.2 %  Comprehensive metabolic panel     Status: Abnormal   Collection Time: 06/16/20  2:24 PM  Result Value Ref Range   Sodium 132 (L) 135 - 145 mmol/L   Potassium 3.5 3.5 - 5.1 mmol/L   Chloride 103 98 - 111 mmol/L   CO2 21 (L) 22 - 32 mmol/L   Glucose, Bld 74 70 - 99 mg/dL   BUN 5 (L) 6 - 20 mg/dL   Creatinine, Ser 3.79 0.44 - 1.00 mg/dL   Calcium 9.3 8.9 - 02.4 mg/dL   Total Protein 7.4 6.5 - 8.1 g/dL   Albumin 3.9 3.5 - 5.0 g/dL   AST 25 15 - 41 U/L   ALT 37 0 - 44 U/L   Alkaline Phosphatase 43 38 - 126 U/L   Total Bilirubin 1.2 0.3 - 1.2 mg/dL   GFR, Estimated >09 >73 mL/min   Anion gap 8 5 - 15    Imaging:   MAU Course: Orders Placed This Encounter  Procedures  . OB Urine Culture  . Urinalysis, Routine w reflex microscopic Urine, Clean Catch  . CBC  . Comprehensive metabolic panel  . Discharge patient   Meds ordered this encounter  Medications  . promethazine (PHENERGAN) 25 mg in sodium chloride 0.9 % 1,000 mL infusion    MDM: UA, culture pending CBC, CMP Phenergan infusion IV Patient tolerating water and crackers after phenergan infusion. Reports she feels much better  Offered Tylenol for back pain however patient declines  Recommend continue using Phenergan suppositories as prescribed, small frequent meals, Pedialyte as  tolerated Return to MAU as needed for emergencies   Assessment: 1. Nausea and vomiting during pregnancy   2. [redacted] weeks gestation of pregnancy     Plan: Discharge home in stable condition.  Keep scheduled appointment  Return to MAU as needed for emergencies   Allergies as of 06/16/2020   No Known Allergies     Medication List    TAKE these medications   FiberCon 625 MG tablet Generic drug: polycarbophil Take 1 tablet (625 mg total) by mouth daily.   ondansetron 4 MG disintegrating tablet Commonly known as: Zofran ODT Take 1 tablet (4 mg total) by mouth every 8 (eight) hours as needed for nausea or vomiting.   OVER THE COUNTER MEDICATION Take 1 tablet by mouth as needed (uti infection).   polyethylene glycol 17 g packet Commonly known as: MiraLax Take 17 g by mouth daily.   PrePLUS 27-1 MG Tabs Take 1 tablet by mouth daily.   promethazine 25 MG tablet Commonly known as: PHENERGAN Take 1 tablet (25 mg total) by mouth every 6 (six) hours as needed for nausea or vomiting.   promethazine 25 MG suppository Commonly known as: PHENERGAN Place 1 suppository (25 mg total) rectally every 6 (six) hours as needed for nausea or vomiting.       Camelia Eng, CNM 06/16/2020 3:46 PM

## 2020-06-16 NOTE — Telephone Encounter (Signed)
Called patient to follow up with her for regarding her call to the after hours triage line for abdominal pain. Patient did not answer. HIPPA compliant voicemail left on patient vm.

## 2020-06-16 NOTE — MAU Note (Signed)
Presents with c/o N/V that began yesterday morning.  Reports unable to keep anything sown including fluids.  Reports was taking Zofran, switched to Phenergan suppository, began using today.

## 2020-06-17 ENCOUNTER — Encounter: Payer: BC Managed Care – PPO | Admitting: Physical Therapy

## 2020-06-18 ENCOUNTER — Telehealth: Payer: Self-pay

## 2020-06-18 LAB — CULTURE, OB URINE

## 2020-06-18 NOTE — Progress Notes (Signed)
Error pt was not ava

## 2020-06-18 NOTE — Telephone Encounter (Signed)
TC to pt to start NOB intake no answer LVM @ 2:31pm .  At appt time I asked front desk to reach out to pt to let her know our office is down 2 nurses and to expect late start time for phone call. Per front desk pt was not ava and detailed message was left as well.

## 2020-06-21 ENCOUNTER — Other Ambulatory Visit: Payer: Self-pay | Admitting: Obstetrics

## 2020-06-22 ENCOUNTER — Other Ambulatory Visit: Payer: Self-pay

## 2020-06-22 ENCOUNTER — Encounter: Payer: Self-pay | Admitting: Obstetrics

## 2020-06-22 DIAGNOSIS — O219 Vomiting of pregnancy, unspecified: Secondary | ICD-10-CM

## 2020-06-22 MED ORDER — PROMETHAZINE HCL 25 MG RE SUPP: 25.0000 mg | Freq: Four times a day (QID) | RECTAL | 2 refills | Status: DC | PRN

## 2020-06-24 ENCOUNTER — Telehealth: Payer: Self-pay | Admitting: Physical Therapy

## 2020-06-24 ENCOUNTER — Ambulatory Visit: Payer: Medicaid Other | Attending: Obstetrics and Gynecology | Admitting: Physical Therapy

## 2020-06-24 DIAGNOSIS — M545 Low back pain, unspecified: Secondary | ICD-10-CM | POA: Insufficient documentation

## 2020-06-24 DIAGNOSIS — R1031 Right lower quadrant pain: Secondary | ICD-10-CM | POA: Insufficient documentation

## 2020-06-24 DIAGNOSIS — M6281 Muscle weakness (generalized): Secondary | ICD-10-CM | POA: Insufficient documentation

## 2020-06-24 DIAGNOSIS — R278 Other lack of coordination: Secondary | ICD-10-CM | POA: Insufficient documentation

## 2020-06-24 NOTE — Telephone Encounter (Signed)
Called patient about her appointment today she missed at 9:30. Patient reports she left a message to cancel her appointment due to her doctor appointment changed to 5/24. Patient will be coming to therapy on 5/25.  Eulis Foster, PT @5 /18/2022@ 9:57 AM

## 2020-06-30 ENCOUNTER — Encounter: Payer: Self-pay | Admitting: Obstetrics and Gynecology

## 2020-06-30 ENCOUNTER — Other Ambulatory Visit (HOSPITAL_COMMUNITY)
Admission: RE | Admit: 2020-06-30 | Discharge: 2020-06-30 | Disposition: A | Discharge status: Home / Self Care | Payer: Medicaid Other | Source: Ambulatory Visit | Attending: Obstetrics and Gynecology | Admitting: Obstetrics and Gynecology

## 2020-06-30 ENCOUNTER — Ambulatory Visit (INDEPENDENT_AMBULATORY_CARE_PROVIDER_SITE_OTHER): Payer: Medicaid Other | Admitting: Obstetrics and Gynecology

## 2020-06-30 ENCOUNTER — Other Ambulatory Visit: Payer: Self-pay

## 2020-06-30 DIAGNOSIS — O219 Vomiting of pregnancy, unspecified: Secondary | ICD-10-CM

## 2020-06-30 DIAGNOSIS — Z3480 Encounter for supervision of other normal pregnancy, unspecified trimester: Secondary | ICD-10-CM | POA: Diagnosis present

## 2020-06-30 LAB — HEPATITIS C ANTIBODY: HCV Ab: NEGATIVE

## 2020-06-30 MED ORDER — PROMETHAZINE HCL 25 MG RE SUPP: 25.0000 mg | Freq: Four times a day (QID) | RECTAL | 2 refills | Status: DC | PRN

## 2020-06-30 MED ORDER — VITAFOL GUMMIES 3.33-0.333-34.8 MG PO CHEW: 2.0000 | CHEWABLE_TABLET | Freq: Every day | ORAL | 6 refills | Status: AC

## 2020-06-30 MED ORDER — DOXYLAMINE-PYRIDOXINE 10-10 MG PO TBEC: 2.0000 | DELAYED_RELEASE_TABLET | Freq: Every day | ORAL | 5 refills | Status: DC

## 2020-06-30 NOTE — Progress Notes (Signed)
Pt states lower pelvic pain and upper abd pain.  Pt states phenergan does help with N&V.  PHQ2=0.

## 2020-06-30 NOTE — Progress Notes (Signed)
   Subjective:    Crystal Bentley is a G1P0000 [redacted]w[redacted]d being seen today for her first obstetrical visit.  Her obstetrical history is significant for first pregnancy. Patient does intend to breast feed. Pregnancy history fully reviewed.  Patient reports nausea and vomiting despite phenergan.  Vitals:   06/30/20 1449  BP: 114/77  Pulse: 88  Weight: 105 lb (47.6 kg)    HISTORY: OB History  Gravida Para Term Preterm AB Living  1 0 0 0 0 0  SAB IAB Ectopic Multiple Live Births  0 0 0 0 0    # Outcome Date GA Lbr Len/2nd Weight Sex Delivery Anes PTL Lv  1 Current            Past Medical History:  Diagnosis Date  . Medical history non-contributory    Past Surgical History:  Procedure Laterality Date  . INGUINAL HERNIA REPAIR     4 yrs   . OVARIAN CYST REMOVAL     6 yrs    Family History  Problem Relation Age of Onset  . Stroke Father      Exam    Uterus:     Pelvic Exam:    Perineum: Normal Perineum   Vulva: normal   Vagina:  normal mucosa, normal discharge   pH:    Cervix: nulliparous appearance and cervix is closed and long   Adnexa: normal adnexa and no mass, fullness, tenderness   Bony Pelvis: gynecoid  System: Breast:  normal appearance, no masses or tenderness   Skin: normal coloration and turgor, no rashes    Neurologic: oriented, no focal deficits   Extremities: normal strength, tone, and muscle mass   HEENT extra ocular movement intact   Mouth/Teeth mucous membranes moist, pharynx normal without lesions and dental hygiene good   Neck supple and no masses   Cardiovascular: regular rate and rhythm   Respiratory:  appears well, vitals normal, no respiratory distress, acyanotic, normal RR, chest clear, no wheezing, crepitations, rhonchi, normal symmetric air entry   Abdomen: soft, non-tender; bowel sounds normal; no masses,  no organomegaly   Urinary:       Assessment:    Pregnancy: G1P0000 Patient Active Problem List   Diagnosis Date Noted  .  Supervision of other normal pregnancy, antepartum 06/30/2020        Plan:     Initial labs drawn. Prenatal vitamins. Problem list reviewed and updated. Genetic Screening discussed : Panorama ordered.  Ultrasound discussed; fetal survey: ordered. Rx diclegis provided  Follow up in 4 weeks. 50% of 30 min visit spent on counseling and coordination of care.     Heily Carlucci 06/30/2020

## 2020-06-30 NOTE — Addendum Note (Signed)
Addended by: Catalina Antigua on: 06/30/2020 04:05 PM   Modules accepted: Orders

## 2020-06-30 NOTE — Patient Instructions (Signed)
 Obstetrics: Normal and Problem Pregnancies (7th ed., pp. 102-121). [place], [place]: [employer]."> Textbook of Family Medicine (9th ed., pp. 365-410). [place], [place]: [employer].">  First Trimester of Pregnancy  The first trimester of pregnancy starts on the first day of your last menstrual period until the end of week 12. This is months 1 through 3 of pregnancy. A week after a sperm fertilizes an egg, the egg will implant into the wall of the uterus and begin to develop into a baby. By the end of 12 weeks, all the baby's organs will be formed and the baby will be 2-3 inches in size. Body changes during your first trimester Your body goes through many changes during pregnancy. The changes vary and generally return to normal after your baby is born. Physical changes  You may gain or lose weight.  Your breasts may begin to grow larger and become tender. The tissue that surrounds your nipples (areola) may become darker.  Dark spots or blotches (chloasma or mask of pregnancy) may develop on your face.  You may have changes in your hair. These can include thickening or thinning of your hair or changes in texture. Health changes  You may feel nauseous, and you may vomit.  You may have heartburn.  You may develop headaches.  You may develop constipation.  Your gums may bleed and may be sensitive to brushing and flossing. Other changes  You may tire easily.  You may urinate more often.  Your menstrual periods will stop.  You may have a loss of appetite.  You may develop cravings for certain kinds of food.  You may have changes in your emotions from day to day.  You may have more vivid and strange dreams. Follow these instructions at home: Medicines  Follow your health care provider's instructions regarding medicine use. Specific medicines may be either safe or unsafe to take during pregnancy. Do not take any medicines unless told to by your health care provider.  Take  a prenatal vitamin that contains at least 600 micrograms (mcg) of folic acid. Eating and drinking  Eat a healthy diet that includes fresh fruits and vegetables, whole grains, good sources of protein such as meat, eggs, or tofu, and low-fat dairy products.  Avoid raw meat and unpasteurized juice, milk, and cheese. These carry germs that can harm you and your baby.  If you feel nauseous or you vomit: ? Eat 4 or 5 small meals a day instead of 3 large meals. ? Try eating a few soda crackers. ? Drink liquids between meals instead of during meals.  You may need to take these actions to prevent or treat constipation: ? Drink enough fluid to keep your urine pale yellow. ? Eat foods that are high in fiber, such as beans, whole grains, and fresh fruits and vegetables. ? Limit foods that are high in fat and processed sugars, such as fried or sweet foods. Activity  Exercise only as directed by your health care provider. Most people can continue their usual exercise routine during pregnancy. Try to exercise for 30 minutes at least 5 days a week.  Stop exercising if you develop pain or cramping in the lower abdomen or lower back.  Avoid exercising if it is very hot or humid or if you are at high altitude.  Avoid heavy lifting.  If you choose to, you may have sex unless your health care provider tells you not to. Relieving pain and discomfort  Wear a good support bra to relieve   breast tenderness.  Rest with your legs elevated if you have leg cramps or low back pain.  If you develop bulging veins (varicose veins) in your legs: ? Wear support hose as told by your health care provider. ? Elevate your feet for 15 minutes, 3-4 times a day. ? Limit salt in your diet. Safety  Wear your seat belt at all times when driving or riding in a car.  Talk with your health care provider if someone is verbally or physically abusive to you.  Talk with your health care provider if you are feeling sad or have  thoughts of hurting yourself. Lifestyle  Do not use hot tubs, steam rooms, or saunas.  Do not douche. Do not use tampons or scented sanitary pads.  Do not use herbal remedies, alcohol, illegal drugs, or medicines that are not approved by your health care provider. Chemicals in these products can harm your baby.  Do not use any products that contain nicotine or tobacco, such as cigarettes, e-cigarettes, and chewing tobacco. If you need help quitting, ask your health care provider.  Avoid cat litter boxes and soil used by cats. These carry germs that can cause birth defects in the baby and possibly loss of the unborn baby (fetus) by miscarriage or stillbirth. General instructions  During routine prenatal visits in the first trimester, your health care provider will do a physical exam, perform necessary tests, and ask you how things are going. Keep all follow-up visits. This is important.  Ask for help if you have counseling or nutritional needs during pregnancy. Your health care provider can offer advice or refer you to specialists for help with various needs.  Schedule a dentist appointment. At home, brush your teeth with a soft toothbrush. Floss gently.  Write down your questions. Take them to your prenatal visits. Where to find more information  American Pregnancy Association: americanpregnancy.org  American College of Obstetricians and Gynecologists: acog.org/en/Womens%20Health/Pregnancy  Office on Women's Health: womenshealth.gov/pregnancy Contact a health care provider if you have:  Dizziness.  A fever.  Mild pelvic cramps, pelvic pressure, or nagging pain in the abdominal area.  Nausea, vomiting, or diarrhea that lasts for 24 hours or longer.  A bad-smelling vaginal discharge.  Pain when you urinate.  Known exposure to a contagious illness, such as chickenpox, measles, Zika virus, HIV, or hepatitis. Get help right away if you have:  Spotting or bleeding from your  vagina.  Severe abdominal cramping or pain.  Shortness of breath or chest pain.  Any kind of trauma, such as from a fall or a car crash.  New or increased pain, swelling, or redness in an arm or leg. Summary  The first trimester of pregnancy starts on the first day of your last menstrual period until the end of week 12 (months 1 through 3).  Eating 4 or 5 small meals a day rather than 3 large meals may help to relieve nausea and vomiting.  Do not use any products that contain nicotine or tobacco, such as cigarettes, e-cigarettes, and chewing tobacco. If you need help quitting, ask your health care provider.  Keep all follow-up visits. This is important. This information is not intended to replace advice given to you by your health care provider. Make sure you discuss any questions you have with your health care provider. Document Revised: 07/03/2019 Document Reviewed: 05/09/2019 [employer] Patient Education  2021 [employer]   Second Trimester of Pregnancy  The second trimester of pregnancy is from week 13 through week   27. This is months 4 through 6 of pregnancy. The second trimester is often a time when you feel your best. Your body has adjusted to being pregnant, and you begin to feel better physically. During the second trimester:  Morning sickness has lessened or stopped completely.  You may have more energy.  You may have an increase in appetite. The second trimester is also a time when the unborn baby (fetus) is growing rapidly. At the end of the sixth month, the fetus may be up to 12 inches long and weigh about 1 pounds. You will likely begin to feel the baby move (quickening) between 16 and 20 weeks of pregnancy. Body changes during your second trimester Your body continues to go through many changes during your second trimester. The changes vary and generally return to normal after the baby is born. Physical changes  Your weight will continue to increase. You will  notice your lower abdomen bulging out.  You may begin to get stretch marks on your hips, abdomen, and breasts.  Your breasts will continue to grow and to become tender.  Dark spots or blotches (chloasma or mask of pregnancy) may develop on your face.  A dark line from your belly button to the pubic area (linea nigra) may appear.  You may have changes in your hair. These can include thickening of your hair, rapid growth, and changes in texture. Some people also have hair loss during or after pregnancy, or hair that feels dry or thin. Health changes  You may develop headaches.  You may have heartburn.  You may develop constipation.  You may develop hemorrhoids or swollen, bulging veins (varicose veins).  Your gums may bleed and may be sensitive to brushing and flossing.  You may urinate more often because the fetus is pressing on your bladder.  You may have back pain. This is caused by: ? Weight gain. ? Pregnancy hormones that are relaxing the joints in your pelvis. ? A shift in weight and the muscles that support your balance. Follow these instructions at home: Medicines  Follow your health care provider's instructions regarding medicine use. Specific medicines may be either safe or unsafe to take during pregnancy. Do not take any medicines unless approved by your health care provider.  Take a prenatal vitamin that contains at least 600 micrograms (mcg) of folic acid. Eating and drinking  Eat a healthy diet that includes fresh fruits and vegetables, whole grains, good sources of protein such as meat, eggs, or tofu, and low-fat dairy products.  Avoid raw meat and unpasteurized juice, milk, and cheese. These carry germs that can harm you and your baby.  You may need to take these actions to prevent or treat constipation: ? Drink enough fluid to keep your urine pale yellow. ? Eat foods that are high in fiber, such as beans, whole grains, and fresh fruits and  vegetables. ? Limit foods that are high in fat and processed sugars, such as fried or sweet foods. Activity  Exercise only as directed by your health care provider. Most people can continue their usual exercise routine during pregnancy. Try to exercise for 30 minutes at least 5 days a week. Stop exercising if you develop contractions in your uterus.  Stop exercising if you develop pain or cramping in the lower abdomen or lower back.  Avoid exercising if it is very hot or humid or if you are at a high altitude.  Avoid heavy lifting.  If you choose to, you may have   sex unless your health care provider tells you not to. Relieving pain and discomfort  Wear a supportive bra to prevent discomfort from breast tenderness.  Take warm sitz baths to soothe any pain or discomfort caused by hemorrhoids. Use hemorrhoid cream if your health care provider approves.  Rest with your legs raised (elevated) if you have leg cramps or low back pain.  If you develop varicose veins: ? Wear support hose as told by your health care provider. ? Elevate your feet for 15 minutes, 3-4 times a day. ? Limit salt in your diet. Safety  Wear your seat belt at all times when driving or riding in a car.  Talk with your health care provider if someone is verbally or physically abusive to you. Lifestyle  Do not use hot tubs, steam rooms, or saunas.  Do not douche. Do not use tampons or scented sanitary pads.  Avoid cat litter boxes and soil used by cats. These carry germs that can cause birth defects in the baby and possibly loss of the fetus by miscarriage or stillbirth.  Do not use herbal remedies, alcohol, illegal drugs, or medicines that are not approved by your health care provider. Chemicals in these products can harm your baby.  Do not use any products that contain nicotine or tobacco, such as cigarettes, e-cigarettes, and chewing tobacco. If you need help quitting, ask your health care provider. General  instructions  During a routine prenatal visit, your health care provider will do a physical exam and other tests. He or she will also discuss your overall health. Keep all follow-up visits. This is important.  Ask your health care provider for a referral to a local prenatal education class.  Ask for help if you have counseling or nutritional needs during pregnancy. Your health care provider can offer advice or refer you to specialists for help with various needs. Where to find more information  American Pregnancy Association: americanpregnancy.org  American College of Obstetricians and Gynecologists: acog.org/en/Womens%20Health/Pregnancy  Office on Women's Health: womenshealth.gov/pregnancy Contact a health care provider if you have:  A headache that does not go away when you take medicine.  Vision changes or you see spots in front of your eyes.  Mild pelvic cramps, pelvic pressure, or nagging pain in the abdominal area.  Persistent nausea, vomiting, or diarrhea.  A bad-smelling vaginal discharge or foul-smelling urine.  Pain when you urinate.  Sudden or extreme swelling of your face, hands, ankles, feet, or legs.  A fever. Get help right away if you:  Have fluid leaking from your vagina.  Have spotting or bleeding from your vagina.  Have severe abdominal cramping or pain.  Have difficulty breathing.  Have chest pain.  Have fainting spells.  Have not felt your baby move for the time period told by your health care provider.  Have new or increased pain, swelling, or redness in an arm or leg. Summary  The second trimester of pregnancy is from week 13 through week 27 (months 4 through 6).  Do not use herbal remedies, alcohol, illegal drugs, or medicines that are not approved by your health care provider. Chemicals in these products can harm your baby.  Exercise only as directed by your health care provider. Most people can continue their usual exercise routine  during pregnancy.  Keep all follow-up visits. This is important. This information is not intended to replace advice given to you by your health care provider. Make sure you discuss any questions you have with your health care   provider. Document Revised: 07/03/2019 Document Reviewed: 05/09/2019 [employer] Patient Education  2021 [employer]   Contraception Choices Contraception, also called birth control, refers to methods or devices that prevent pregnancy. Hormonal methods Contraceptive implant A contraceptive implant is a thin, plastic tube that contains a hormone that prevents pregnancy. It is different from an intrauterine device (IUD). It is inserted into the upper part of the arm by a health care provider. Implants can be effective for up to 3 years. Progestin-only injections Progestin-only injections are injections of progestin, a synthetic form of the hormone progesterone. They are given every 3 months by a health care provider. Birth control pills Birth control pills are pills that contain hormones that prevent pregnancy. They must be taken once a day, preferably at the same time each day. A prescription is needed to use this method of contraception. Birth control patch The birth control patch contains hormones that prevent pregnancy. It is placed on the skin and must be changed once a week for three weeks and removed on the fourth week. A prescription is needed to use this method of contraception. Vaginal ring A vaginal ring contains hormones that prevent pregnancy. It is placed in the vagina for three weeks and removed on the fourth week. After that, the process is repeated with a new ring. A prescription is needed to use this method of contraception. Emergency contraceptive Emergency contraceptives prevent pregnancy after unprotected sex. They come in pill form and can be taken up to 5 days after sex. They work best the sooner they are taken after having sex. Most emergency  contraceptives are available without a prescription. This method should not be used as your only form of birth control.   Barrier methods Female condom A female condom is a thin sheath that is worn over the penis during sex. Condoms keep sperm from going inside a woman's body. They can be used with a sperm-killing substance (spermicide) to increase their effectiveness. They should be thrown away after one use. Female condom A female condom is a soft, loose-fitting sheath that is put into the vagina before sex. The condom keeps sperm from going inside a woman's body. They should be thrown away after one use. Diaphragm A diaphragm is a soft, dome-shaped barrier. It is inserted into the vagina before sex, along with a spermicide. The diaphragm blocks sperm from entering the uterus, and the spermicide kills sperm. A diaphragm should be left in the vagina for 6-8 hours after sex and removed within 24 hours. A diaphragm is prescribed and fitted by a health care provider. A diaphragm should be replaced every 1-2 years, after giving birth, after gaining more than 15 lb (6.8 kg), and after pelvic surgery. Cervical cap A cervical cap is a round, soft latex or plastic cup that fits over the cervix. It is inserted into the vagina before sex, along with spermicide. It blocks sperm from entering the uterus. The cap should be left in place for 6-8 hours after sex and removed within 48 hours. A cervical cap must be prescribed and fitted by a health care provider. It should be replaced every 2 years. Sponge A sponge is a soft, circular piece of polyurethane foam with spermicide in it. The sponge helps block sperm from entering the uterus, and the spermicide kills sperm. To use it, you make it wet and then insert it into the vagina. It should be inserted before sex, left in for at least 6 hours after sex, and removed and thrown away   within 30 hours. Spermicides Spermicides are chemicals that kill or block sperm from  entering the cervix and uterus. They can come as a cream, jelly, suppository, foam, or tablet. A spermicide should be inserted into the vagina with an applicator at least 10-15 minutes before sex to allow time for it to work. The process must be repeated every time you have sex. Spermicides do not require a prescription.   Intrauterine contraception Intrauterine device (IUD) An IUD is a T-shaped device that is put in a woman's uterus. There are two types:  Hormone IUD.This type contains progestin, a synthetic form of the hormone progesterone. This type can stay in place for 3-5 years.  Copper IUD.This type is wrapped in copper wire. It can stay in place for 10 years. Permanent methods of contraception Female tubal ligation In this method, a woman's fallopian tubes are sealed, tied, or blocked during surgery to prevent eggs from traveling to the uterus. Hysteroscopic sterilization In this method, a small, flexible insert is placed into each fallopian tube. The inserts cause scar tissue to form in the fallopian tubes and block them, so sperm cannot reach an egg. The procedure takes about 3 months to be effective. Another form of birth control must be used during those 3 months. Female sterilization This is a procedure to tie off the tubes that carry sperm (vasectomy). After the procedure, the man can still ejaculate fluid (semen). Another form of birth control must be used for 3 months after the procedure. Natural planning methods Natural family planning In this method, a couple does not have sex on days when the woman could become pregnant. Calendar method In this method, the woman keeps track of the length of each menstrual cycle, identifies the days when pregnancy can happen, and does not have sex on those days. Ovulation method In this method, a couple avoids sex during ovulation. Symptothermal method This method involves not having sex during ovulation. The woman typically checks for  ovulation by watching changes in her temperature and in the consistency of cervical mucus. Post-ovulation method In this method, a couple waits to have sex until after ovulation. Where to find more information  Centers for Disease Control and Prevention: www.cdc.gov Summary  Contraception, also called birth control, refers to methods or devices that prevent pregnancy.  Hormonal methods of contraception include implants, injections, pills, patches, vaginal rings, and emergency contraceptives.  Barrier methods of contraception can include female condoms, female condoms, diaphragms, cervical caps, sponges, and spermicides.  There are two types of IUDs (intrauterine devices). An IUD can be put in a woman's uterus to prevent pregnancy for 3-5 years.  Permanent sterilization can be done through a procedure for males and females. Natural family planning methods involve nothaving sex on days when the woman could become pregnant. This information is not intended to replace advice given to you by your health care provider. Make sure you discuss any questions you have with your health care provider. Document Revised: 07/01/2019 Document Reviewed: 07/01/2019 Elsevier Patient Education  2021 [employer].   Breastfeeding  Choosing to breastfeed is one of the best decisions you can make for yourself and your baby. A change in hormones during pregnancy causes your breasts to make breast milk in your milk-producing glands. Hormones prevent breast milk from being released before your baby is born. They also prompt milk flow after birth. Once breastfeeding has begun, thoughts of your baby, as well as his or her sucking or crying, can stimulate the release of milk   from your milk-producing glands. Benefits of breastfeeding Research shows that breastfeeding offers many health benefits for infants and mothers. It also offers a cost-free and convenient way to feed your baby. For your baby  Your first milk  (colostrum) helps your baby's digestive system to function better.  Special cells in your milk (antibodies) help your baby to fight off infections.  Breastfed babies are less likely to develop asthma, allergies, obesity, or type 2 diabetes. They are also at lower risk for sudden infant death syndrome (SIDS).  Nutrients in breast milk are better able to meet your baby's needs compared to infant formula.  Breast milk improves your baby's brain development. For you  Breastfeeding helps to create a very special bond between you and your baby.  Breastfeeding is convenient. Breast milk costs nothing and is always available at the correct temperature.  Breastfeeding helps to burn calories. It helps you to lose the weight that you gained during pregnancy.  Breastfeeding makes your uterus return faster to its size before pregnancy. It also slows bleeding (lochia) after you give birth.  Breastfeeding helps to lower your risk of developing type 2 diabetes, osteoporosis, rheumatoid arthritis, cardiovascular disease, and breast, ovarian, uterine, and endometrial cancer later in life. Breastfeeding basics Starting breastfeeding  Find a comfortable place to sit or lie down, with your neck and back well-supported.  Place a pillow or a rolled-up blanket under your baby to bring him or her to the level of your breast (if you are seated). Nursing pillows are specially designed to help support your arms and your baby while you breastfeed.  Make sure that your baby's tummy (abdomen) is facing your abdomen.  Gently massage your breast. With your fingertips, massage from the outer edges of your breast inward toward the nipple. This encourages milk flow. If your milk flows slowly, you may need to continue this action during the feeding.  Support your breast with 4 fingers underneath and your thumb above your nipple (make the letter "C" with your hand). Make sure your fingers are well away from your nipple and  your baby's mouth.  Stroke your baby's lips gently with your finger or nipple.  When your baby's mouth is open wide enough, quickly bring your baby to your breast, placing your entire nipple and as much of the areola as possible into your baby's mouth. The areola is the colored area around your nipple. ? More areola should be visible above your baby's upper lip than below the lower lip. ? Your baby's lips should be opened and extended outward (flanged) to ensure an adequate, comfortable latch. ? Your baby's tongue should be between his or her lower gum and your breast.  Make sure that your baby's mouth is correctly positioned around your nipple (latched). Your baby's lips should create a seal on your breast and be turned out (everted).  It is common for your baby to suck about 2-3 minutes in order to start the flow of breast milk. Latching Teaching your baby how to latch onto your breast properly is very important. An improper latch can cause nipple pain, decreased milk supply, and poor weight gain in your baby. Also, if your baby is not latched onto your nipple properly, he or she may swallow some air during feeding. This can make your baby fussy. Burping your baby when you switch breasts during the feeding can help to get rid of the air. However, teaching your baby to latch on properly is still the best way to   prevent fussiness from swallowing air while breastfeeding. Signs that your baby has successfully latched onto your nipple  Silent tugging or silent sucking, without causing you pain. Infant's lips should be extended outward (flanged).  Swallowing heard between every 3-4 sucks once your milk has started to flow (after your let-down milk reflex occurs).  Muscle movement above and in front of his or her ears while sucking. Signs that your baby has not successfully latched onto your nipple  Sucking sounds or smacking sounds from your baby while breastfeeding.  Nipple pain. If you think  your baby has not latched on correctly, slip your finger into the corner of your baby's mouth to break the suction and place it between your baby's gums. Attempt to start breastfeeding again. Signs of successful breastfeeding Signs from your baby  Your baby will gradually decrease the number of sucks or will completely stop sucking.  Your baby will fall asleep.  Your baby's body will relax.  Your baby will retain a small amount of milk in his or her mouth.  Your baby will let go of your breast by himself or herself. Signs from you  Breasts that have increased in firmness, weight, and size 1-3 hours after feeding.  Breasts that are softer immediately after breastfeeding.  Increased milk volume, as well as a change in milk consistency and color by the fifth day of breastfeeding.  Nipples that are not sore, cracked, or bleeding. Signs that your baby is getting enough milk  Wetting at least 1-2 diapers during the first 24 hours after birth.  Wetting at least 5-6 diapers every 24 hours for the first week after birth. The urine should be clear or pale yellow by the age of 5 days.  Wetting 6-8 diapers every 24 hours as your baby continues to grow and develop.  At least 3 stools in a 24-hour period by the age of 5 days. The stool should be soft and yellow.  At least 3 stools in a 24-hour period by the age of 7 days. The stool should be seedy and yellow.  No loss of weight greater than 10% of birth weight during the first 3 days of life.  Average weight gain of 4-7 oz (113-198 g) per week after the age of 4 days.  Consistent daily weight gain by the age of 5 days, without weight loss after the age of 2 weeks. After a feeding, your baby may spit up a small amount of milk. This is normal. Breastfeeding frequency and duration Frequent feeding will help you make more milk and can prevent sore nipples and extremely full breasts (breast engorgement). Breastfeed when you feel the need to  reduce the fullness of your breasts or when your baby shows signs of hunger. This is called "breastfeeding on demand." Signs that your baby is hungry include:  Increased alertness, activity, or restlessness.  Movement of the head from side to side.  Opening of the mouth when the corner of the mouth or cheek is stroked (rooting).  Increased sucking sounds, smacking lips, cooing, sighing, or squeaking.  Hand-to-mouth movements and sucking on fingers or hands.  Fussing or crying. Avoid introducing a pacifier to your baby in the first 4-6 weeks after your baby is born. After this time, you may choose to use a pacifier. Research has shown that pacifier use during the first year of a baby's life decreases the risk of sudden infant death syndrome (SIDS). Allow your baby to feed on each breast as long as   he or she wants. When your baby unlatches or falls asleep while feeding from the first breast, offer the second breast. Because newborns are often sleepy in the first few weeks of life, you may need to awaken your baby to get him or her to feed. Breastfeeding times will vary from baby to baby. However, the following rules can serve as a guide to help you make sure that your baby is properly fed:  Newborns (babies 4 weeks of age or younger) may breastfeed every 1-3 hours.  Newborns should not go without breastfeeding for longer than 3 hours during the day or 5 hours during the night.  You should breastfeed your baby a minimum of 8 times in a 24-hour period. Breast milk pumping Pumping and storing breast milk allows you to make sure that your baby is exclusively fed your breast milk, even at times when you are unable to breastfeed. This is especially important if you go back to work while you are still breastfeeding, or if you are not able to be present during feedings. Your lactation consultant can help you find a method of pumping that works best for you and give you guidelines about how long it is  safe to store breast milk.      Caring for your breasts while you breastfeed Nipples can become dry, cracked, and sore while breastfeeding. The following recommendations can help keep your breasts moisturized and healthy:  Avoid using soap on your nipples.  Wear a supportive bra designed especially for nursing. Avoid wearing underwire-style bras or extremely tight bras (sports bras).  Air-dry your nipples for 3-4 minutes after each feeding.  Use only cotton bra pads to absorb leaked breast milk. Leaking of breast milk between feedings is normal.  Use lanolin on your nipples after breastfeeding. Lanolin helps to maintain your skin's normal moisture barrier. Pure lanolin is not harmful (not toxic) to your baby. You may also hand express a few drops of breast milk and gently massage that milk into your nipples and allow the milk to air-dry. In the first few weeks after giving birth, some women experience breast engorgement. Engorgement can make your breasts feel heavy, warm, and tender to the touch. Engorgement peaks within 3-5 days after you give birth. The following recommendations can help to ease engorgement:  Completely empty your breasts while breastfeeding or pumping. You may want to start by applying warm, moist heat (in the shower or with warm, water-soaked hand towels) just before feeding or pumping. This increases circulation and helps the milk flow. If your baby does not completely empty your breasts while breastfeeding, pump any extra milk after he or she is finished.  Apply ice packs to your breasts immediately after breastfeeding or pumping, unless this is too uncomfortable for you. To do this: ? Put ice in a plastic bag. ? Place a towel between your skin and the bag. ? Leave the ice on for 20 minutes, 2-3 times a day.  Make sure that your baby is latched on and positioned properly while breastfeeding. If engorgement persists after 48 hours of following these recommendations,  contact your health care provider or a lactation consultant. Overall health care recommendations while breastfeeding  Eat 3 healthy meals and 3 snacks every day. Well-nourished mothers who are breastfeeding need an additional 450-500 calories a day. You can meet this requirement by increasing the amount of a balanced diet that you eat.  Drink enough water to keep your urine pale yellow or clear.  Rest   often, relax, and continue to take your prenatal vitamins to prevent fatigue, stress, and low vitamin and mineral levels in your body (nutrient deficiencies).  Do not use any products that contain nicotine or tobacco, such as cigarettes and e-cigarettes. Your baby may be harmed by chemicals from cigarettes that pass into breast milk and exposure to secondhand smoke. If you need help quitting, ask your health care provider.  Avoid alcohol.  Do not use illegal drugs or marijuana.  Talk with your health care provider before taking any medicines. These include over-the-counter and prescription medicines as well as vitamins and herbal supplements. Some medicines that may be harmful to your baby can pass through breast milk.  It is possible to become pregnant while breastfeeding. If birth control is desired, ask your health care provider about options that will be safe while breastfeeding your baby. Where to find more information: [employer]: www.llli.org Contact a health care provider if:  You feel like you want to stop breastfeeding or have become frustrated with breastfeeding.  Your nipples are cracked or bleeding.  Your breasts are red, tender, or warm.  You have: ? Painful breasts or nipples. ? A swollen area on either breast. ? A fever or chills. ? Nausea or vomiting. ? Drainage other than breast milk from your nipples.  Your breasts do not become full before feedings by the fifth day after you give birth.  You feel sad and depressed.  Your baby is: ? Too  sleepy to eat well. ? Having trouble sleeping. ? More than 1 week old and wetting fewer than 6 diapers in a 24-hour period. ? Not gaining weight by 5 days of age.  Your baby has fewer than 3 stools in a 24-hour period.  Your baby's skin or the white parts of his or her eyes become yellow. Get help right away if:  Your baby is overly tired (lethargic) and does not want to wake up and feed.  Your baby develops an unexplained fever. Summary  Breastfeeding offers many health benefits for infant and mothers.  Try to breastfeed your infant when he or she shows early signs of hunger.  Gently tickle or stroke your baby's lips with your finger or nipple to allow the baby to open his or her mouth. Bring the baby to your breast. Make sure that much of the areola is in your baby's mouth. Offer one side and burp the baby before you offer the other side.  Talk with your health care provider or lactation consultant if you have questions or you face problems as you breastfeed. This information is not intended to replace advice given to you by your health care provider. Make sure you discuss any questions you have with your health care provider. Document Revised: 04/20/2017 Document Reviewed: 02/26/2016 Elsevier Patient Education  2021 [employer]vier Inc.  

## 2020-07-01 ENCOUNTER — Encounter: Payer: Self-pay | Admitting: Physical Therapy

## 2020-07-01 ENCOUNTER — Ambulatory Visit: Payer: Medicaid Other | Admitting: Physical Therapy

## 2020-07-01 ENCOUNTER — Other Ambulatory Visit: Payer: Self-pay

## 2020-07-01 DIAGNOSIS — R278 Other lack of coordination: Secondary | ICD-10-CM | POA: Diagnosis present

## 2020-07-01 DIAGNOSIS — M6281 Muscle weakness (generalized): Secondary | ICD-10-CM

## 2020-07-01 DIAGNOSIS — M545 Low back pain, unspecified: Secondary | ICD-10-CM | POA: Diagnosis present

## 2020-07-01 DIAGNOSIS — R1031 Right lower quadrant pain: Secondary | ICD-10-CM

## 2020-07-01 LAB — OBSTETRIC PANEL, INCLUDING HIV
Antibody Screen: NEGATIVE
Basophils Absolute: 0.1 10*3/uL (ref 0.0–0.2)
Basos: 1 %
EOS (ABSOLUTE): 0.1 10*3/uL (ref 0.0–0.4)
Eos: 2 %
HIV Screen 4th Generation wRfx: NONREACTIVE
Hematocrit: 37.2 % (ref 34.0–46.6)
Hemoglobin: 12 g/dL (ref 11.1–15.9)
Hepatitis B Surface Ag: NEGATIVE
Immature Grans (Abs): 0 10*3/uL (ref 0.0–0.1)
Immature Granulocytes: 0 %
Lymphocytes Absolute: 3 10*3/uL (ref 0.7–3.1)
Lymphs: 38 %
MCH: 28 pg (ref 26.6–33.0)
MCHC: 32.3 g/dL (ref 31.5–35.7)
MCV: 87 fL (ref 79–97)
Monocytes Absolute: 0.5 10*3/uL (ref 0.1–0.9)
Monocytes: 6 %
Neutrophils Absolute: 4.2 10*3/uL (ref 1.4–7.0)
Neutrophils: 53 %
Platelets: 337 10*3/uL (ref 150–450)
RBC: 4.28 x10E6/uL (ref 3.77–5.28)
RDW: 12.7 % (ref 11.7–15.4)
RPR Ser Ql: NONREACTIVE
Rh Factor: POSITIVE
Rubella Antibodies, IGG: 11 index (ref >0.99)
WBC: 7.9 10*3/uL (ref 3.4–10.8)

## 2020-07-01 LAB — CERVICOVAGINAL ANCILLARY ONLY
Chlamydia: NEGATIVE
Comment: NEGATIVE
Comment: NORMAL
Neisseria Gonorrhea: NEGATIVE

## 2020-07-01 LAB — HEPATITIS C ANTIBODY: Hep C Virus Ab: <0.1 s/co ratio (ref 0.0–0.9)

## 2020-07-01 NOTE — Therapy (Signed)
[facility] [address], [address] [place], [place], [address] Phone: [phone]   Fax:  [phone]  Physical Therapy Treatment  Patient Details  Name: Crystal Bentley MRN: [id number] Date of Birth: [birth year] Referring Provider (PT): Dr. Catalina Antigua   Encounter Date: 07/01/2020   PT End of Session - 07/01/20 1131    Visit Number 4    Date for PT Re-Evaluation 09/23/20    Authorization Type [employer]    PT Start Time 1100    PT Stop Time 1138    PT Time Calculation (min) 38 min    Activity Tolerance Patient tolerated treatment well;Patient limited by pain;Patient limited by fatigue    Behavior During Therapy [facility] for tasks assessed/performed           Past Medical History:  Diagnosis Date  . Medical history non-contributory     Past Surgical History:  Procedure Laterality Date  . INGUINAL HERNIA REPAIR     4 yrs   . OVARIAN CYST REMOVAL     6 yrs     There were no vitals filed for this visit.   Subjective Assessment - 07/01/20 1107    Subjective I saw the doctor and doing well. Patient is 11 weeks and 3 days. Patient is having trouble with vomiting and nausea.    Patient Stated Goals reduce pain    Currently in Pain? Yes    Pain Score 7     Pain Location Back    Pain Orientation Posterior    Pain Descriptors / Indicators Aching    Pain Type Acute pain    Pain Onset More than a month ago    Pain Frequency Constant    Aggravating Factors  bending over to vomit.    Pain Relieving Factors laying down    Multiple Pain Sites Yes    Pain Score 5   can be a 10/10   Pain Location Abdomen    Pain Orientation Anterior;Lower    Pain Descriptors / Indicators Aching    Pain Type Acute pain    Pain Onset More than a month ago    Pain Frequency Constant    Aggravating Factors  laying down and abdomen tightens, vomiting    Pain Relieving Factors nothing helps              [facility] PT Assessment - 07/01/20  0001      Assessment   Medical Diagnosis N94.10 Dyspareunia in female    Referring Provider (PT) Dr. Catalina Antigua    Prior Therapy none      Precautions   Precautions None      Restrictions   Weight Bearing Restrictions No      Home Environment   Living Environment Private residence      Prior Function   Level of Independence Independent      Cognition   Overall Cognitive Status Within Functional Limits for tasks assessed      Observation/Other Assessments   Focus on Therapeutic Outcomes (FOTO)  POPIQ-7 29 POINTS      Posture/Postural Control   Posture/Postural Control No significant limitations      ROM / Strength   AROM / PROM / Strength AROM;PROM;Strength      AROM   Lumbar - Right Side Bend full with pain    Lumbar - Left Side Bend full with pain    Lumbar - Right Rotation full with pain      Strength   Right Hip  ABduction 4-/5    Left Hip ABduction 4-/5      Palpation   SI assessment  ASIS    Palpation comment tenderness in uppper and lower quadrant pain                      Pelvic Floor Special Questions - 07/01/20 0001    Exam Type Deferred   due to being early on pregnancy and sick from vomiting.            [facility] Adult PT Treatment/Exercise - 07/01/20 0001      Self-Care   Self-Care Other Self-Care Comments    Other Self-Care Comments  using heat to reduce her pain in her back by using a warm shower or towel with warm water on it.      Therapeutic Activites    Therapeutic Activities Other Therapeutic Activities;Lifting    Lifting lifting with knees and hips bend    Other Therapeutic Activities laying on her side with pillows between her knees and one under her belly to keep spinal neutral.; ways to vomit to reduce strain on her back due to her being pregnant      Neuro Re-ed    Neuro Re-ed Details  diaphragmatic breathing in sidely      Manual Therapy   Manual Therapy Soft tissue mobilization;Myofascial release    Soft tissue  mobilization manual work on the right lumbar and thoracic paraspinals; maual work to the left shoulder    Myofascial Release using the suction cup on the thoracic and lumbar paraspinals on the right                  PT Education - 07/01/20 1133    Education Details diaphragmatic breathing, laying with support    Person(s) Educated Patient    Methods Explanation    Comprehension Verbalized understanding            PT Short Term Goals - 07/01/20 1137      PT SHORT TERM GOAL #1   Title independent with initial HEP including scar massage and perineal massage    Time 4    Period Weeks    Status Achieved      PT SHORT TERM GOAL #2   Title ability to bulge the pelvic floor with diaphragmatic breathing    Baseline still learning    Time 4    Period Weeks    Status On-going             PT Long Term Goals - 07/01/20 1137      PT LONG TERM GOAL #1   Title independent with advanced HEP    Baseline still being educated as she progresses in therapy    Time 12    Period Weeks    Status On-going      PT LONG TERM GOAL #2   Title able to have penile penetration vaginally with pain level </= 0-1/10 due to reduction of scar tissue and improve tissue mobitliy    Baseline not having sex at this time    Time 12    Period Weeks    Status Deferred      PT LONG TERM GOAL #3   Title able to perform daily activities with back pain </= 0-1/10 due to improve mobility    Baseline pain level is 5-10/10    Time 12    Period Weeks    Status On-going      PT LONG  TERM GOAL #4   Title able to have a bowel movement without straining due to reduction of pelvic floor tightness and elongation with breath    Baseline no straining with bowel movements    Time 12    Period Weeks    Status Achieved      PT LONG TERM GOAL #5   Title understand different ways to reduce her back and abdominal pain as her pregnancy progresses    Baseline not educated yet    Time 14    Period Weeks     Status New    Target Date 09/23/20      PT LONG TERM GOAL #6   Title understand different ways to give birth to reduce strain on back and abdomen    Baseline not educated at this time    Time 20    Period Weeks    Status New    Target Date 09/23/20                 Plan - 07/01/20 1142    Clinical Impression Statement Patient is 11 weeks and 3 days pregnant. She is having alot of nausea and vomiting with this pregnacy and fatique. She is having increased back and abdominal pain due to her tissues stretching for the pregnancy and the severe vomiting. Patient lumbar ROM is full but painful. Patient is not having difficulty with bowel movements. Patient is not having pain with intercourse and does not want to have it during her pregnancy. Patient has established care with her doctor since her pregnancy. Patient has weakn hip abductors to 4-/5. She has tenderness located in the back and throughout the abdomen. POPIQ-7 is 29. Patient will benefit from skilled therapy to educated on pain management and reduce as her pregnancy progresses.    Personal Factors and Comorbidities Comorbidity 2;Sex    Comorbidities right hernia repair; right ovarian cyst removed    Examination-Participation Restrictions Interpersonal Relationship;Community Activity    Stability/Clinical Decision Making Stable/Uncomplicated    Rehab Potential Excellent    PT Frequency 1x / week    PT Duration 12 weeks    PT Treatment/Interventions ADLs/Self Care Home Management;Cryotherapy;Moist Heat;Neuromuscular re-education;Therapeutic exercise;Therapeutic activities;Patient/family education;Manual techniques;Dry needling;Scar mobilization;Spinal Manipulations    PT Next Visit Plan manual work to the back; sitting positions to reduce pain    PT Home Exercise Plan Access Code: [email or web address]    Recommended Other Services sent MD renewal    Consulted and Agree with Plan of Care Patient           Patient will benefit from skilled  therapeutic intervention in order to improve the following deficits and impairments:  Decreased coordination,Decreased range of motion,Increased fascial restricitons,Decreased endurance,Increased muscle spasms,Pain,Decreased activity tolerance,Decreased scar mobility,Decreased strength  Visit Diagnosis: Muscle weakness (generalized) - Plan: PT plan of care cert/re-cert  Other lack of coordination - Plan: PT plan of care cert/re-cert  Right lower quadrant abdominal pain - Plan: PT plan of care cert/re-cert  Acute bilateral low back pain without sciatica - Plan: PT plan of care cert/re-cert     Problem List Patient Active Problem List   Diagnosis Date Noted  . Supervision of other normal pregnancy, antepartum 06/30/2020    Eulis Foster, PT 07/01/20 11:50 AM   [facility] [address] [place], [place], [address] Phone: [phone]   Fax:  [phone]  Name: Crystal Bentley MRN: [id number] Date of Birth: December 01, 1995

## 2020-07-02 LAB — CULTURE, OB URINE

## 2020-07-02 LAB — URINE CULTURE, OB REFLEX

## 2020-07-03 ENCOUNTER — Other Ambulatory Visit: Payer: Self-pay

## 2020-07-03 DIAGNOSIS — O219 Vomiting of pregnancy, unspecified: Secondary | ICD-10-CM

## 2020-07-03 MED ORDER — PROMETHAZINE HCL 25 MG RE SUPP: 25.0000 mg | Freq: Four times a day (QID) | RECTAL | 2 refills | Status: DC | PRN

## 2020-07-03 NOTE — Progress Notes (Signed)
Pt called requesting Rx refill on phenergan. Rx sent Pt made aware.

## 2020-07-08 ENCOUNTER — Encounter: Payer: Self-pay | Admitting: Obstetrics and Gynecology

## 2020-07-08 ENCOUNTER — Encounter (HOSPITAL_COMMUNITY): Payer: Self-pay | Admitting: Obstetrics and Gynecology

## 2020-07-08 ENCOUNTER — Inpatient Hospital Stay (HOSPITAL_COMMUNITY)
Admission: AD | Admit: 2020-07-08 | Discharge: 2020-07-08 | Disposition: A | Discharge status: Home / Self Care | Payer: BC Managed Care – PPO | Attending: Obstetrics and Gynecology | Admitting: Obstetrics and Gynecology

## 2020-07-08 ENCOUNTER — Inpatient Hospital Stay (HOSPITAL_COMMUNITY): Payer: BC Managed Care – PPO

## 2020-07-08 ENCOUNTER — Other Ambulatory Visit: Payer: Self-pay

## 2020-07-08 DIAGNOSIS — N281 Cyst of kidney, acquired: Secondary | ICD-10-CM | POA: Diagnosis not present

## 2020-07-08 DIAGNOSIS — O26831 Pregnancy related renal disease, first trimester: Secondary | ICD-10-CM | POA: Insufficient documentation

## 2020-07-08 DIAGNOSIS — O3481 Maternal care for other abnormalities of pelvic organs, first trimester: Secondary | ICD-10-CM | POA: Insufficient documentation

## 2020-07-08 DIAGNOSIS — R1031 Right lower quadrant pain: Secondary | ICD-10-CM | POA: Insufficient documentation

## 2020-07-08 DIAGNOSIS — Z3480 Encounter for supervision of other normal pregnancy, unspecified trimester: Secondary | ICD-10-CM

## 2020-07-08 DIAGNOSIS — R109 Unspecified abdominal pain: Secondary | ICD-10-CM

## 2020-07-08 DIAGNOSIS — Z6790 Unspecified blood type, Rh positive: Secondary | ICD-10-CM | POA: Diagnosis not present

## 2020-07-08 DIAGNOSIS — Z79899 Other long term (current) drug therapy: Secondary | ICD-10-CM | POA: Insufficient documentation

## 2020-07-08 DIAGNOSIS — Z3A13 13 weeks gestation of pregnancy: Secondary | ICD-10-CM | POA: Diagnosis not present

## 2020-07-08 DIAGNOSIS — N83201 Unspecified ovarian cyst, right side: Secondary | ICD-10-CM | POA: Insufficient documentation

## 2020-07-08 DIAGNOSIS — O26891 Other specified pregnancy related conditions, first trimester: Secondary | ICD-10-CM | POA: Insufficient documentation

## 2020-07-08 DIAGNOSIS — O219 Vomiting of pregnancy, unspecified: Secondary | ICD-10-CM | POA: Insufficient documentation

## 2020-07-08 DIAGNOSIS — N83202 Unspecified ovarian cyst, left side: Secondary | ICD-10-CM | POA: Insufficient documentation

## 2020-07-08 DIAGNOSIS — N83209 Unspecified ovarian cyst, unspecified side: Secondary | ICD-10-CM

## 2020-07-08 LAB — URINALYSIS, ROUTINE W REFLEX MICROSCOPIC
Bilirubin Urine: NEGATIVE
Glucose, UA: NEGATIVE mg/dL
Hgb urine dipstick: NEGATIVE
Ketones, ur: 80 mg/dL — AB
Nitrite: NEGATIVE
Protein, ur: NEGATIVE mg/dL
Specific Gravity, Urine: 1.017 (ref 1.005–1.030)
pH: 8 (ref 5.0–8.0)

## 2020-07-08 LAB — CBC
HCT: 35.7 % — ABNORMAL LOW (ref 36.0–46.0)
Hemoglobin: 12.2 g/dL (ref 12.0–15.0)
MCH: 28.6 pg (ref 26.0–34.0)
MCHC: 34.2 g/dL (ref 30.0–36.0)
MCV: 83.8 fL (ref 80.0–100.0)
Platelets: 319 10*3/uL (ref 150–400)
RBC: 4.26 MIL/uL (ref 3.87–5.11)
RDW: 12 % (ref 11.5–15.5)
WBC: 8.9 10*3/uL (ref 4.0–10.5)
nRBC: 0 % (ref 0.0–0.2)

## 2020-07-08 LAB — COMPREHENSIVE METABOLIC PANEL
ALT: 30 U/L (ref 0–44)
AST: 26 U/L (ref 15–41)
Albumin: 3.8 g/dL (ref 3.5–5.0)
Alkaline Phosphatase: 43 U/L (ref 38–126)
Anion gap: 10 (ref 5–15)
BUN: <5 mg/dL — ABNORMAL LOW (ref 6–20)
CO2: 23 mmol/L (ref 22–32)
Calcium: 9.3 mg/dL (ref 8.9–10.3)
Chloride: 99 mmol/L (ref 98–111)
Creatinine, Ser: 0.48 mg/dL (ref 0.44–1.00)
GFR, Estimated: >60 mL/min (ref >60)
Glucose, Bld: 99 mg/dL (ref 70–99)
Potassium: 3.4 mmol/L — ABNORMAL LOW (ref 3.5–5.1)
Sodium: 132 mmol/L — ABNORMAL LOW (ref 135–145)
Total Bilirubin: 0.7 mg/dL (ref 0.3–1.2)
Total Protein: 7.5 g/dL (ref 6.5–8.1)

## 2020-07-08 LAB — TYPE AND SCREEN
ABO/RH(D): O POS
Antibody Screen: NEGATIVE

## 2020-07-08 LAB — LIPASE, BLOOD: Lipase: 26 U/L (ref 11–51)

## 2020-07-08 MED ORDER — LACTATED RINGERS IV BOLUS
1000.0000 mL | Freq: Once | INTRAVENOUS | Status: AC
  Administered 2020-07-08: 1000 mL via INTRAVENOUS

## 2020-07-08 MED ORDER — TRAMADOL HCL 50 MG PO TABS: 50.0000 mg | ORAL_TABLET | Freq: Four times a day (QID) | ORAL | 0 refills | Status: DC | PRN

## 2020-07-08 MED ORDER — POTASSIUM CHLORIDE 10 MEQ/100ML IV SOLN
10.0000 meq | Freq: Once | INTRAVENOUS | Status: AC
  Administered 2020-07-08: 10 meq via INTRAVENOUS
  Filled 2020-07-08: qty 100

## 2020-07-08 MED ORDER — HYDROMORPHONE HCL 1 MG/ML IJ SOLN
1.0000 mg | Freq: Once | INTRAMUSCULAR | Status: AC
Start: 2020-07-08 — End: 2020-07-08
  Administered 2020-07-08: 1 mg via INTRAVENOUS
  Filled 2020-07-08: qty 1

## 2020-07-08 MED ORDER — SODIUM CHLORIDE 0.9 % IV SOLN
12.5000 mg | Freq: Four times a day (QID) | INTRAVENOUS | Status: DC | PRN
  Administered 2020-07-08: 12.5 mg via INTRAVENOUS
  Filled 2020-07-08: qty 0.5

## 2020-07-08 NOTE — Progress Notes (Signed)
Declines need for pain meds

## 2020-07-08 NOTE — MAU Note (Signed)
Sharp pain in RLQ, started last night, is constant and getting worse. Took Tylenol at midnight- no relief.  Has been told the pain was from vomiting.  When urinating gets worse, can't stand, can't lay down. Area where she had hernia surgery.

## 2020-07-08 NOTE — Progress Notes (Signed)
History     [id number]  Arrival date and time: 07/08/20 3235    Chief Complaint  Patient presents with  . Emesis  . Abdominal Pain    HPI Destina Mwangi is a 25 y.o. at [redacted]w[redacted]d by LMP with PMHx notable for inguinal hernia repair and ovarian cyst removal, who presents for severe abdominal pain. She reports the pain began last night, is sharp, located in RLQ near prior inguinal hernia surgical incision, and prevented her from sleeping. She states that she had pain in the same location in the past few weeks, but rates prior pain as 5-6/10, while current pain is 10/10. She had 2 episodes of non-bloody, non-bilious vomiting twice around midnight from the pain. She took Tylenol but states it did not help. She is tearful and uncomfortable in all positions, including laying flat and standing up straight. She had a headache yesterday but it has now resolved.   She denies vaginal bleeding, discharge, dysuria, or change in bowel movement.   Review of discharge summary from last admission on 06/16/20:  -Presented with 2 days of nausea, vomiting. Responded well to IV phenergan infusion, d/c home.    O/Positive/-- (05/24 1601)  OB History    Gravida  1   Para  0   Term  0   Preterm  0   AB  0   Living  0     SAB  0   IAB  0   Ectopic  0   Multiple  0   Live Births  0           Past Medical History:  Diagnosis Date  . Medical history non-contributory     Past Surgical History:  Procedure Laterality Date  . INGUINAL HERNIA REPAIR     4 yrs   . OVARIAN CYST REMOVAL     6 yrs     Family History  Problem Relation Age of Onset  . Healthy Mother   . Stroke Father     Social History   Socioeconomic History  . Marital status: Single    Spouse name: Not on file  . Number of children: Not on file  . Years of education: Not on file  . Highest education level: Not on file  Occupational History  . Not on file  Tobacco Use  . Smoking status: Never Smoker  .  Smokeless tobacco: Never Used  Vaping Use  . Vaping Use: Never used  Substance and Sexual Activity  . Alcohol use: Never  . Drug use: Never  . Sexual activity: Yes    Birth control/protection: None  Other Topics Concern  . Not on file  Social History Narrative  . Not on file   Social Determinants of Health   Financial Resource Strain: Not on file  Food Insecurity: Not on file  Transportation Needs: Not on file  Physical Activity: Not on file  Stress: Not on file  Social Connections: Not on file  Intimate Partner Violence: Not on file    No Known Allergies  No current facility-administered medications on file prior to encounter.   Current Outpatient Medications on File Prior to Encounter  Medication Sig Dispense Refill  . Doxylamine-Pyridoxine (DICLEGIS) 10-10 MG TBEC Take 2 tablets by mouth at bedtime. If symptoms persist, add one tablet in the morning and one in the afternoon 100 tablet 5  . Prenatal Vit-Fe Phos-FA-Omega (VITAFOL GUMMIES) 3.33-0.333-34.8 MG CHEW Chew 2 tablets by mouth daily. 60 tablet 6  . promethazine (PHENERGAN)  25 MG suppository Place 1 suppository (25 mg total) rectally every 6 (six) hours as needed for nausea or vomiting. 12 each 2  . ondansetron (ZOFRAN ODT) 4 MG disintegrating tablet Take 1 tablet (4 mg total) by mouth every 8 (eight) hours as needed for nausea or vomiting. 20 tablet 0  . OVER THE COUNTER MEDICATION Take 1 tablet by mouth as needed (uti infection). (Patient not taking: Reported on 05/18/2020)    . polycarbophil (FIBERCON) 625 MG tablet Take 1 tablet (625 mg total) by mouth daily. 30 tablet 1  . polyethylene glycol (MIRALAX) 17 g packet Take 17 g by mouth daily. 14 each 2  . Prenatal Vit-Fe Fumarate-FA (PREPLUS) 27-1 MG TABS Take 1 tablet by mouth daily. 30 tablet 12  . promethazine (PHENERGAN) 25 MG tablet Take 1 tablet (25 mg total) by mouth every 6 (six) hours as needed for nausea or vomiting. 30 tablet 2     Review of Systems   Constitutional: Negative for chills and fever.  HENT: Negative for hearing loss and sore throat.   Eyes: Negative for blurred vision.  Respiratory: Negative for cough and shortness of breath.   Cardiovascular: Negative for chest pain and palpitations.  Gastrointestinal: Positive for abdominal pain, nausea and vomiting. Negative for constipation, diarrhea and heartburn.  Genitourinary: Negative for dysuria, frequency and urgency.  Skin: Negative for rash.  Neurological: Negative for dizziness and headaches.    Physical Exam   BP 113/75 (BP Location: Right Arm)   Pulse 98   Temp 98.1 F (36.7 C) (Oral)   Resp 20   Wt 45.3 kg   LMP 04/11/2020   SpO2 100%   BMI 17.70 kg/m   Physical Exam Constitutional:      General: She is in acute distress.  HENT:     Head: Normocephalic and atraumatic.  Pulmonary:     Effort: Pulmonary effort is normal.  Abdominal:     General: A surgical scar is present.     Palpations: Abdomen is soft.     Tenderness: There is abdominal tenderness in the right upper quadrant and right lower quadrant. There is guarding.  Skin:    General: Skin is warm and dry.  Neurological:     Mental Status: She is oriented to person, place, and time.    Labs Results for orders placed or performed during the hospital encounter of 07/08/20 (from the past 24 hour(s))  Urinalysis, Routine w reflex microscopic Urine, Clean Catch     Status: Abnormal   Collection Time: 07/08/20  9:50 AM  Result Value Ref Range   Color, Urine YELLOW YELLOW   APPearance CLOUDY (A) CLEAR   Specific Gravity, Urine 1.017 1.005 - 1.030   pH 8.0 5.0 - 8.0   Glucose, UA NEGATIVE NEGATIVE mg/dL   Hgb urine dipstick NEGATIVE NEGATIVE   Bilirubin Urine NEGATIVE NEGATIVE   Ketones, ur 80 (A) NEGATIVE mg/dL   Protein, ur NEGATIVE NEGATIVE mg/dL   Nitrite NEGATIVE NEGATIVE   Leukocytes,Ua MODERATE (A) NEGATIVE   RBC / HPF 0-5 0 - 5 RBC/hpf   WBC, UA 6-10 0 - 5 WBC/hpf   Bacteria, UA FEW  (A) NONE SEEN   Squamous Epithelial / LPF 6-10 0 - 5   Mucus PRESENT    Amorphous Crystal PRESENT   CBC     Status: Abnormal   Collection Time: 07/08/20 10:27 AM  Result Value Ref Range   WBC 8.9 4.0 - 10.5 K/uL   RBC 4.26 3.87 -  5.11 MIL/uL   Hemoglobin 12.2 12.0 - 15.0 g/dL   HCT 76.7 (L) 34.1 - 93.7 %   MCV 83.8 80.0 - 100.0 fL   MCH 28.6 26.0 - 34.0 pg   MCHC 34.2 30.0 - 36.0 g/dL   RDW 90.2 40.9 - 73.5 %   Platelets 319 150 - 400 K/uL   nRBC 0.0 0.0 - 0.2 %  Comprehensive metabolic panel     Status: Abnormal   Collection Time: 07/08/20 10:27 AM  Result Value Ref Range   Sodium 132 (L) 135 - 145 mmol/L   Potassium 3.4 (L) 3.5 - 5.1 mmol/L   Chloride 99 98 - 111 mmol/L   CO2 23 22 - 32 mmol/L   Glucose, Bld 99 70 - 99 mg/dL   BUN <5 (L) 6 - 20 mg/dL   Creatinine, Ser 3.29 0.44 - 1.00 mg/dL   Calcium 9.3 8.9 - 92.4 mg/dL   Total Protein 7.5 6.5 - 8.1 g/dL   Albumin 3.8 3.5 - 5.0 g/dL   AST 26 15 - 41 U/L   ALT 30 0 - 44 U/L   Alkaline Phosphatase 43 38 - 126 U/L   Total Bilirubin 0.7 0.3 - 1.2 mg/dL   GFR, Estimated >26 >83 mL/min   Anion gap 10 5 - 15  Type and screen     Status: None   Collection Time: 07/08/20 10:27 AM  Result Value Ref Range   ABO/RH(D) O POS    Antibody Screen NEG    Sample Expiration      07/11/2020,2359 Performed at [facility], [address] [place], [place] [address]   Lipase, blood     Status: None   Collection Time: 07/08/20 10:27 AM  Result Value Ref Range   Lipase 26 11 - 51 U/L    Imaging No results found.  MAU Course  Procedures  Lab Orders     Urinalysis, Routine w reflex microscopic Urine, Clean Catch     CBC     Comprehensive metabolic panel     Lipase, blood Meds ordered this encounter  Medications  . HYDROmorphone (DILAUDID) injection 1 mg  . lactated ringers bolus 1,000 mL    Imaging Orders     US OB Comp Less 14 Wks  Medical Decision Making  25 year old G1P0 at [redacted]w[redacted]d with severe abdominal pain  and associated vomiting. Differential includes appendicitis, UTI, hemorrhagic ovarian cyst, and pancreatitis. Ordered UA to assess for UTI, CBC to check WBC/infection, CMP to assess for electrolyte abnormalities, [place] to visualize ovaries, Lipase to r/u pancreatitis. Ectopic ruled out at prior admission on 06/03/2020 - US showed IUP consistent with dates. [place] today pending.    UA showed ketones, given IV LR. Given IV Dilaudid for pain.   Assessment and Plan  1. Abdominal pain during pregnancy  2. [redacted] weeks gestation of pregnancy     Niyana Chesbro L [place]

## 2020-07-08 NOTE — MAU Provider Note (Signed)
Chief Complaint: Emesis and Abdominal Pain   Event Date/Time   First Provider Initiated Contact with Patient 07/08/20 1008      SUBJECTIVE HPI: Crystal Bentley is a 25 y.o. G1P0000 at [redacted]w[redacted]d by early ultrasound who presents to maternity admissions reporting pain in RLQ associated with n/v. She has n/v in the pregnancy and uses Phenergan suppositories Q 6 hours but today vomited x 2 related to abdominal pain. She has hx inguinal hernia repair 5 years ago and reports pain is near incision site. She took Tylenol which did not help.  She is uncomfortable and unable to lie flat or to stand up straight due to pain.   She denies vaginal bleeding, vaginal itching/burning, urinary symptoms, h/a, dizziness, n/v, or fever/chills.     Location: RLQ abdomen Quality: sharp Severity: 10/10 on pain scale Duration: 2 days Timing: constant Modifying factors: Tylenol or position change have not helped Associated signs and symptoms: nausea and vomiting  HPI  Past Medical History:  Diagnosis Date  . Medical history non-contributory    Past Surgical History:  Procedure Laterality Date  . INGUINAL HERNIA REPAIR     5 yrs   . OVARIAN CYST REMOVAL     7 yrs    Social History   Socioeconomic History  . Marital status: Single    Spouse name: Not on file  . Number of children: Not on file  . Years of education: Not on file  . Highest education level: Not on file  Occupational History  . Not on file  Tobacco Use  . Smoking status: Never Smoker  . Smokeless tobacco: Never Used  Vaping Use  . Vaping Use: Never used  Substance and Sexual Activity  . Alcohol use: Never  . Drug use: Never  . Sexual activity: Yes    Birth control/protection: None  Other Topics Concern  . Not on file  Social History Narrative  . Not on file   Social Determinants of Health   Financial Resource Strain: Not on file  Food Insecurity: Not on file  Transportation Needs: Not on file  Physical Activity: Not on  file  Stress: Not on file  Social Connections: Not on file  Intimate Partner Violence: Not on file   No current facility-administered medications on file prior to encounter.   Current Outpatient Medications on File Prior to Encounter  Medication Sig Dispense Refill  . Doxylamine-Pyridoxine (DICLEGIS) 10-10 MG TBEC Take 2 tablets by mouth at bedtime. If symptoms persist, add one tablet in the morning and one in the afternoon 100 tablet 5  . Prenatal Vit-Fe Phos-FA-Omega (VITAFOL GUMMIES) 3.33-0.333-34.8 MG CHEW Chew 2 tablets by mouth daily. 60 tablet 6  . promethazine (PHENERGAN) 25 MG suppository Place 1 suppository (25 mg total) rectally every 6 (six) hours as needed for nausea or vomiting. 12 each 2  . ondansetron (ZOFRAN ODT) 4 MG disintegrating tablet Take 1 tablet (4 mg total) by mouth every 8 (eight) hours as needed for nausea or vomiting. 20 tablet 0  . OVER THE COUNTER MEDICATION Take 1 tablet by mouth as needed (uti infection). (Patient not taking: Reported on 05/18/2020)    . polycarbophil (FIBERCON) 625 MG tablet Take 1 tablet (625 mg total) by mouth daily. 30 tablet 1  . polyethylene glycol (MIRALAX) 17 g packet Take 17 g by mouth daily. 14 each 2  . Prenatal Vit-Fe Fumarate-FA (PREPLUS) 27-1 MG TABS Take 1 tablet by mouth daily. 30 tablet 12  . promethazine (PHENERGAN) 25 MG tablet  Take 1 tablet (25 mg total) by mouth every 6 (six) hours as needed for nausea or vomiting. 30 tablet 2   No Known Allergies  ROS:  Review of Systems  Constitutional: Negative for chills and fever.  Respiratory: Negative for cough and shortness of breath.   Cardiovascular: Negative for chest pain.  Gastrointestinal: Positive for abdominal pain, nausea and vomiting.  Genitourinary: Negative for dysuria, frequency, urgency and vaginal bleeding.  Musculoskeletal: Negative.   Neurological: Negative for dizziness and headaches.     I have reviewed patient's Past Medical Hx, Surgical Hx, Family Hx,  Social Hx, medications and allergies.   Physical Exam   Patient Vitals for the past 24 hrs:  BP Temp Temp src Pulse Resp SpO2 Weight  07/08/20 1954 96/63 97.8 F (36.6 C) -- 83 18 99 % --  07/08/20 1448 99/66 98.8 F (37.1 C) Oral 93 20 100 % --  07/08/20 0958 113/75 -- -- 98 20 100 % --  07/08/20 0934 111/85 98.1 F (36.7 C) Oral (!) 116 (!) 24 100 % 45.3 kg   Constitutional: Well-developed, well-nourished female in no acute distress.  Cardiovascular: normal rate Respiratory: normal effort GI: Abd soft, non-tender. Pos BS x 4 MS: Extremities nontender, no edema, normal ROM Neurologic: Alert and oriented x 4.  GU: Neg CVAT.  PELVIC EXAM: Deferred  LAB RESULTS Results for orders placed or performed during the hospital encounter of 07/08/20 (from the past 24 hour(s))  Urinalysis, Routine w reflex microscopic Urine, Clean Catch     Status: Abnormal   Collection Time: 07/08/20  9:50 AM  Result Value Ref Range   Color, Urine YELLOW YELLOW   APPearance CLOUDY (A) CLEAR   Specific Gravity, Urine 1.017 1.005 - 1.030   pH 8.0 5.0 - 8.0   Glucose, UA NEGATIVE NEGATIVE mg/dL   Hgb urine dipstick NEGATIVE NEGATIVE   Bilirubin Urine NEGATIVE NEGATIVE   Ketones, ur 80 (A) NEGATIVE mg/dL   Protein, ur NEGATIVE NEGATIVE mg/dL   Nitrite NEGATIVE NEGATIVE   Leukocytes,Ua MODERATE (A) NEGATIVE   RBC / HPF 0-5 0 - 5 RBC/hpf   WBC, UA 6-10 0 - 5 WBC/hpf   Bacteria, UA FEW (A) NONE SEEN   Squamous Epithelial / LPF 6-10 0 - 5   Mucus PRESENT    Amorphous Crystal PRESENT   CBC     Status: Abnormal   Collection Time: 07/08/20 10:27 AM  Result Value Ref Range   WBC 8.9 4.0 - 10.5 K/uL   RBC 4.26 3.87 - 5.11 MIL/uL   Hemoglobin 12.2 12.0 - 15.0 g/dL   HCT 16.135.7 (L) 09.636.0 - 04.546.0 %   MCV 83.8 80.0 - 100.0 fL   MCH 28.6 26.0 - 34.0 pg   MCHC 34.2 30.0 - 36.0 g/dL   RDW 40.912.0 81.111.5 - 91.415.5 %   Platelets 319 150 - 400 K/uL   nRBC 0.0 0.0 - 0.2 %  Comprehensive metabolic panel     Status:  Abnormal   Collection Time: 07/08/20 10:27 AM  Result Value Ref Range   Sodium 132 (L) 135 - 145 mmol/L   Potassium 3.4 (L) 3.5 - 5.1 mmol/L   Chloride 99 98 - 111 mmol/L   CO2 23 22 - 32 mmol/L   Glucose, Bld 99 70 - 99 mg/dL   BUN <5 (L) 6 - 20 mg/dL   Creatinine, Ser 7.820.48 0.44 - 1.00 mg/dL   Calcium 9.3 8.9 - 95.610.3 mg/dL   Total Protein 7.5 6.5 -  8.1 g/dL   Albumin 3.8 3.5 - 5.0 g/dL   AST 26 15 - 41 U/L   ALT 30 0 - 44 U/L   Alkaline Phosphatase 43 38 - 126 U/L   Total Bilirubin 0.7 0.3 - 1.2 mg/dL   GFR, Estimated >87 >56 mL/min   Anion gap 10 5 - 15  Type and screen     Status: None   Collection Time: 07/08/20 10:27 AM  Result Value Ref Range   ABO/RH(D) O POS    Antibody Screen NEG    Sample Expiration      07/11/2020,2359 Performed at [facility], [address] [place], [place] [address]   Lipase, blood     Status: None   Collection Time: 07/08/20 10:27 AM  Result Value Ref Range   Lipase 26 11 - 51 U/L    --/--/O POS (06/01 1027)  IMAGING MR PELVIS WO CONTRAST  Result Date: 07/08/2020 CLINICAL DATA:  Right lower quadrant abdominal pain since last night. 1st trimester pregnancy. History inguinal hernia repair. Clinical concern for appendicitis. EXAM: MRI ABDOMEN AND PELVIS WITHOUT CONTRAST TECHNIQUE: Multiplanar multisequence MR imaging of the abdomen and pelvis was performed. No intravenous contrast was administered. COMPARISON:  Pelvic ultrasound same date. FINDINGS: COMBINED FINDINGS FOR BOTH MR ABDOMEN AND PELVIS Lower chest:  Visualized lower chest appears unremarkable. Hepatobiliary: The liver is normal in signal without focal abnormality. No evidence of gallstones, gallbladder wall thickening or biliary dilatation. Pancreas: Unremarkable. No pancreatic ductal dilatation or surrounding inflammatory changes. Spleen: Normal in size without focal abnormality. Adrenals/Urinary Tract: Both adrenal glands appear normal. No evidence of renal mass, hydronephrosis  or perinephric fluid collection. Stomach/Bowel: The stomach appears unremarkable for its degree of distension. No evidence of bowel wall thickening, distention or surrounding inflammatory change. The appendix is visualized on the axial images and appears normal in caliber. Vascular/Lymphatic: There are no enlarged abdominal or pelvic lymph nodes. No significant vascular findings. Reproductive: Intrauterine pregnancy is noted. As seen on earlier ultrasound, there are large adnexal cystic masses which are predominately right-sided and midline on this examination. There is a right-sided component measuring 5.0 x 4.2 x 4.3 cm, and a more midline component superior to the bladder which measures 6.9 x 6.8 x 6.6 cm. This has mass effect on the bladder. No clear left adnexal mass identified by this study. There is free peritoneal fluid surrounding the cysts, suggesting partial rupture. Other: As above, moderate amount of fluid surrounding the right adnexal cysts without generalized ascites or focal extraluminal fluid collection. Musculoskeletal: No acute or significant osseous findings. No inguinal abnormality identified. IMPRESSION: 1. No evidence of acute appendicitis. 2. Large mildly complex cystic right adnexal masses with surrounding free fluid suspicious for ruptured right ovarian cysts. Given the size of the cysts, pelvic ultrasound follow-up recommended. 3. No hydronephrosis. Electronically Signed   By: Carey Bullocks M.D.   On: 07/08/2020 19:30   MR ABDOMEN WO CONTRAST  Result Date: 07/08/2020 CLINICAL DATA:  Right lower quadrant abdominal pain since last night. 1st trimester pregnancy. History inguinal hernia repair. Clinical concern for appendicitis. EXAM: MRI ABDOMEN AND PELVIS WITHOUT CONTRAST TECHNIQUE: Multiplanar multisequence MR imaging of the abdomen and pelvis was performed. No intravenous contrast was administered. COMPARISON:  Pelvic ultrasound same date. FINDINGS: COMBINED FINDINGS FOR BOTH MR  ABDOMEN AND PELVIS Lower chest:  Visualized lower chest appears unremarkable. Hepatobiliary: The liver is normal in signal without focal abnormality. No evidence of gallstones, gallbladder wall thickening or biliary dilatation. Pancreas:  Unremarkable. No pancreatic ductal dilatation or surrounding inflammatory changes. Spleen: Normal in size without focal abnormality. Adrenals/Urinary Tract: Both adrenal glands appear normal. No evidence of renal mass, hydronephrosis or perinephric fluid collection. Stomach/Bowel: The stomach appears unremarkable for its degree of distension. No evidence of bowel wall thickening, distention or surrounding inflammatory change. The appendix is visualized on the axial images and appears normal in caliber. Vascular/Lymphatic: There are no enlarged abdominal or pelvic lymph nodes. No significant vascular findings. Reproductive: Intrauterine pregnancy is noted. As seen on earlier ultrasound, there are large adnexal cystic masses which are predominately right-sided and midline on this examination. There is a right-sided component measuring 5.0 x 4.2 x 4.3 cm, and a more midline component superior to the bladder which measures 6.9 x 6.8 x 6.6 cm. This has mass effect on the bladder. No clear left adnexal mass identified by this study. There is free peritoneal fluid surrounding the cysts, suggesting partial rupture. Other: As above, moderate amount of fluid surrounding the right adnexal cysts without generalized ascites or focal extraluminal fluid collection. Musculoskeletal: No acute or significant osseous findings. No inguinal abnormality identified. IMPRESSION: 1. No evidence of acute appendicitis. 2. Large mildly complex cystic right adnexal masses with surrounding free fluid suspicious for ruptured right ovarian cysts. Given the size of the cysts, pelvic ultrasound follow-up recommended. 3. No hydronephrosis. Electronically Signed   By: Carey Bullocks M.D.   On: 07/08/2020 19:30   US  OB Comp Less 14 Wks  Addendum Date: 07/08/2020   ADDENDUM REPORT: 07/08/2020 14:05 ADDENDUM: Typographical error in the impression. Correct 3rd impression should read as follows: Bilateral OVARIAN cysts. Electronically Signed   By: Elige Ko   On: 07/08/2020 14:05   Result Date: 07/08/2020 CLINICAL DATA:  Abdominal pain EXAM: OBSTETRIC <14 WK [place] US DOPPLER ULTRASOUND OF OVARIES TECHNIQUE: Both transabdominal and transvaginal ultrasound examinations were performed for complete evaluation of the gestation as well as the maternal uterus, adnexal regions, and pelvic cul-de-sac. Transvaginal technique was performed to assess early pregnancy. Color and duplex Doppler ultrasound was utilized to evaluate blood flow to the ovaries. COMPARISON:  None. FINDINGS: Intrauterine gestational sac: Present Yolk sac:  No Embryo:  Visualized Cardiac Activity: Visualized Heart Rate: 155 bpm CRL: 77.3  mm   13 w 6 d                  [place] EDC: 01/07/2021 Subchorionic hemorrhage:  None visualized. Maternal uterus/adnexae: Right ovary measures 6.8 x 4.9 x 6.2 cm with a volume of 108 mL. 4.1 x 4.1 x 4.3 cm anechoic right ovarian mass consistent with a cyst. Left ovary measures 6.7 x 6 x 7 x 7 cm with a volume of 162.6 mL. 5.7 x 6.1 x 6.4 cm anechoic left ovarian mass consistent with a cyst. No pelvic free fluid. Pulsed Doppler evaluation of both ovaries demonstrates normal appearing low-resistance arterial and venous waveforms. IMPRESSION: 1. No ovarian torsion. 2. Single live intrauterine pregnancy as described above. 3. Bilateral renal cysts. Electronically Signed: By: Elige Ko On: 07/08/2020 12:21   US PELVIS LIMITED (TRANSABDOMINAL ONLY)  Result Date: 07/08/2020 CLINICAL DATA:  Status post right inguinal hernia repair. EXAM: LIMITED ULTRASOUND OF PELVIS TECHNIQUE: Limited transabdominal ultrasound examination of the pelvis was performed. COMPARISON:  None. FINDINGS: Limited sonographic evaluation was performed in the right  inguinal region. No mass, cyst or fluid collection is noted. No hernia is noted. IMPRESSION: No hernia or other abnormality seen in right inguinal region. Electronically Signed  By: Lupita Raider M.D.   On: 07/08/2020 16:08   US PELVIC DOPPLER (TORSION R/O OR MASS ARTERIAL FLOW)  Addendum Date: 07/08/2020   ADDENDUM REPORT: 07/08/2020 14:05 ADDENDUM: Typographical error in the impression. Correct 3rd impression should read as follows: Bilateral OVARIAN cysts. Electronically Signed   By: Elige Ko   On: 07/08/2020 14:05   Result Date: 07/08/2020 CLINICAL DATA:  Abdominal pain EXAM: OBSTETRIC <14 WK [place] US DOPPLER ULTRASOUND OF OVARIES TECHNIQUE: Both transabdominal and transvaginal ultrasound examinations were performed for complete evaluation of the gestation as well as the maternal uterus, adnexal regions, and pelvic cul-de-sac. Transvaginal technique was performed to assess early pregnancy. Color and duplex Doppler ultrasound was utilized to evaluate blood flow to the ovaries. COMPARISON:  None. FINDINGS: Intrauterine gestational sac: Present Yolk sac:  No Embryo:  Visualized Cardiac Activity: Visualized Heart Rate: 155 bpm CRL: 77.3  mm   13 w 6 d                  [place] EDC: 01/07/2021 Subchorionic hemorrhage:  None visualized. Maternal uterus/adnexae: Right ovary measures 6.8 x 4.9 x 6.2 cm with a volume of 108 mL. 4.1 x 4.1 x 4.3 cm anechoic right ovarian mass consistent with a cyst. Left ovary measures 6.7 x 6 x 7 x 7 cm with a volume of 162.6 mL. 5.7 x 6.1 x 6.4 cm anechoic left ovarian mass consistent with a cyst. No pelvic free fluid. Pulsed Doppler evaluation of both ovaries demonstrates normal appearing low-resistance arterial and venous waveforms. IMPRESSION: 1. No ovarian torsion. 2. Single live intrauterine pregnancy as described above. 3. Bilateral renal cysts. Electronically Signed: By: Elige Ko On: 07/08/2020 12:21    MAU Management/MDM: Orders Placed This Encounter  Procedures  . US  OB Comp Less 14 Wks  . US PELVIC DOPPLER (TORSION R/O OR MASS ARTERIAL FLOW)  . US PELVIS LIMITED (TRANSABDOMINAL ONLY)  . MR ABDOMEN WO CONTRAST  . MR PELVIS WO CONTRAST  . Urinalysis, Routine w reflex microscopic Urine, Clean Catch  . CBC  . Comprehensive metabolic panel  . Lipase, blood  . Type and screen  . Discharge patient    Meds ordered this encounter  Medications  . HYDROmorphone (DILAUDID) injection 1 mg  . lactated ringers bolus 1,000 mL  . lactated ringers bolus 1,000 mL  . promethazine (PHENERGAN) 12.5 mg in sodium chloride 0.9 % 50 mL IVPB  . potassium chloride 10 mEq in 100 mL IVPB  . lactated ringers bolus 1,000 mL  . traMADol (ULTRAM) 50 MG tablet    Sig: Take 1 tablet (50 mg total) by mouth every 6 (six) hours as needed.    Dispense:  15 tablet    Refill:  0    Order Specific Question:   Supervising Provider    Answer:   Conan Bowens [[id number]]    Pt without acute abdomen but with significant tenderness in RLQ on exam.  Dilaudid 1 mg IV given x 1 which provided relief for a short time but pt declined further pain medication but continued to report pain.  OB US wnl with IUP c/w previous dates, bilateral ovarian cysts noted on [place].  Pt with hx hernia repair so consult with radiology and [place] of abdomen performed to evaluate previous surgical area and results wnl.  MRI to evaluate appendix, which resulted as normal, but indicated a ruptured ovarian cyst on the right, which likely is cause for patient's pain.  D/C home with  Rx for Tramadol. F/U at [facility] as scheduled, return to MAU as needed for emergencies. [place] in 5-6 weeks to evaluate cyst.     ASSESSMENT 1. Ruptured ovarian cyst   2. Supervision of other normal pregnancy, antepartum   3. Right ovarian cyst   4. Abdominal pain during pregnancy in first trimester   5. Right sided abdominal pain   6. Bilateral ovarian cysts   7. [redacted] weeks gestation of pregnancy   8. Nausea and vomiting during pregnancy prior to [redacted] weeks  gestation   9. Blood type, Rh positive     PLAN Discharge home Allergies as of 07/08/2020   No Known Allergies     Medication List    TAKE these medications   Doxylamine-Pyridoxine 10-10 MG Tbec Commonly known as: Diclegis Take 2 tablets by mouth at bedtime. If symptoms persist, add one tablet in the morning and one in the afternoon   FiberCon 625 MG tablet Generic drug: polycarbophil Take 1 tablet (625 mg total) by mouth daily.   ondansetron 4 MG disintegrating tablet Commonly known as: Zofran ODT Take 1 tablet (4 mg total) by mouth every 8 (eight) hours as needed for nausea or vomiting.   OVER THE COUNTER MEDICATION Take 1 tablet by mouth as needed (uti infection).   polyethylene glycol 17 g packet Commonly known as: MiraLax Take 17 g by mouth daily.   PrePLUS 27-1 MG Tabs Take 1 tablet by mouth daily.   promethazine 25 MG tablet Commonly known as: PHENERGAN Take 1 tablet (25 mg total) by mouth every 6 (six) hours as needed for nausea or vomiting.   promethazine 25 MG suppository Commonly known as: PHENERGAN Place 1 suppository (25 mg total) rectally every 6 (six) hours as needed for nausea or vomiting.   traMADol 50 MG tablet Commonly known as: ULTRAM Take 1 tablet (50 mg total) by mouth every 6 (six) hours as needed.   Vitafol Gummies 3.33-0.333-34.8 MG Chew Chew 2 tablets by mouth daily.       Follow-up Information    [facility] Follow up.   Why: Return to MAU as needed for emergencies Contact information: [address] [place] [address] [phone]              Sharen Counter Certified Nurse-Midwife 07/08/2020  8:23 PM

## 2020-07-15 ENCOUNTER — Other Ambulatory Visit: Payer: Self-pay

## 2020-07-15 DIAGNOSIS — O219 Vomiting of pregnancy, unspecified: Secondary | ICD-10-CM

## 2020-07-15 MED ORDER — PROMETHAZINE HCL 25 MG RE SUPP
25.0000 mg | Freq: Four times a day (QID) | RECTAL | 1 refills | Status: DC | PRN
Start: 2020-07-15 — End: 2020-07-28

## 2020-07-16 ENCOUNTER — Ambulatory Visit: Payer: BC Managed Care – PPO | Attending: Obstetrics and Gynecology | Admitting: Physical Therapy

## 2020-07-16 ENCOUNTER — Encounter: Payer: Self-pay | Admitting: Physical Therapy

## 2020-07-16 ENCOUNTER — Other Ambulatory Visit: Payer: Self-pay

## 2020-07-16 DIAGNOSIS — M545 Low back pain, unspecified: Secondary | ICD-10-CM | POA: Insufficient documentation

## 2020-07-16 DIAGNOSIS — R278 Other lack of coordination: Secondary | ICD-10-CM | POA: Diagnosis present

## 2020-07-16 DIAGNOSIS — R1031 Right lower quadrant pain: Secondary | ICD-10-CM | POA: Insufficient documentation

## 2020-07-16 DIAGNOSIS — M6281 Muscle weakness (generalized): Secondary | ICD-10-CM | POA: Diagnosis not present

## 2020-07-16 NOTE — Therapy (Signed)
[facility] [address], [address] [place], [place], [address] Phone: [phone]   Fax:  [phone]  Physical Therapy Treatment  Patient Details  Name: Crystal Bentley MRN: [id number] Date of Birth: 06-11-1995 Referring Provider (PT): Dr. Catalina Antigua   Encounter Date: 07/16/2020   PT End of Session - 07/16/20 1243     Visit Number 5    Date for PT Re-Evaluation 09/23/20    Authorization Type [employer]    Authorization Time Period 5/25-7/24    Authorization - Visit Number 1    Authorization - Number of Visits 10    PT Start Time 1230    PT Stop Time 1308    PT Time Calculation (min) 38 min    Activity Tolerance Patient tolerated treatment well;Patient limited by pain;Patient limited by fatigue    Behavior During Therapy [facility] for tasks assessed/performed             Past Medical History:  Diagnosis Date   Medical history non-contributory     Past Surgical History:  Procedure Laterality Date   INGUINAL HERNIA REPAIR     4 yrs    OVARIAN CYST REMOVAL     6 yrs     There were no vitals filed for this visit.   Subjective Assessment - 07/16/20 1238     Subjective I have cyst and it is leaking. I am weak from the medicaiton that I take for the pain and makes me weak.    Patient Stated Goals reduce pain    Currently in Pain? Yes   due to taking the medication   Pain Score 5     Pain Location Back    Pain Orientation Mid;Lower    Pain Descriptors / Indicators Aching    Pain Type Acute pain    Pain Onset More than a month ago    Pain Frequency Constant    Aggravating Factors  movement    Pain Relieving Factors stretch    Multiple Pain Sites No                               [facility] Adult PT Treatment/Exercise - 07/16/20 0001       Lumbar Exercises: Stretches   Active Hamstring Stretch Right;Left;1 rep;30 seconds    Active Hamstring Stretch Limitations supine with strap    Single  Knee to Chest Stretch Left    Quadruped Mid Back Stretch 2 reps;30 seconds    Quadruped Mid Back Stretch Limitations childs pose    ITB Stretch Right;Left;1 rep;30 seconds    ITB Stretch Limitations with strap      Lumbar Exercises: Sidelying   Other Sidelying Lumbar Exercises open boodk to increase spinal motion 20 times each side      Lumbar Exercises: Quadruped   Madcat/Old Horse 10 reps      Manual Therapy   Manual Therapy Soft tissue mobilization;Joint mobilization    Joint Mobilization gentle PA mobilization to T5-T10, side glide mobilizaiton to L1-L3    Soft tissue mobilization manual mobilizaiton to thoraco lumbar paraspinals and quadratus                      PT Short Term Goals - 07/16/20 1249       PT SHORT TERM GOAL #1   Title independent with initial HEP including scar massage and perineal massage    Time 4  Period Weeks    Status Achieved      PT SHORT TERM GOAL #2   Title ability to bulge the pelvic floor with diaphragmatic breathing    Time 4    Period Weeks    Status On-going               PT Long Term Goals - 07/16/20 1250       PT LONG TERM GOAL #1   Title independent with advanced HEP    Time 12    Period Weeks    Status On-going      PT LONG TERM GOAL #2   Title able to have penile penetration vaginally with pain level </= 0-1/10 due to reduction of scar tissue and improve tissue mobitliy    Time 12    Period Weeks    Status Deferred      PT LONG TERM GOAL #3   Title able to perform daily activities with back pain </= 0-1/10 due to improve mobility    Baseline pain level is 5-10/10    Period Weeks    Status On-going      PT LONG TERM GOAL #4   Title able to have a bowel movement without straining due to reduction of pelvic floor tightness and elongation with breath    Time 12    Period Weeks    Status Achieved      PT LONG TERM GOAL #5   Title understand different ways to reduce her back and abdominal pain as her  pregnancy progresses    Time 12    Period Weeks    Status New      PT LONG TERM GOAL #6   Title understand different ways to give birth to reduce strain on back and abdomen    Baseline not educated at this time    Time 89    Period Weeks    Status New                   Plan - 07/16/20 1314     Clinical Impression Statement Patient has more energy today. She found out she has an ovarian cyst that leaks. She is taking pain medication for the abdominal pain. She has tightness in the lumbar and thoracic fascia.  Patient was able to tolerate exercises today. Patient was able to tolerate laying on her stomach with pillows forming a dip for her abdomen. Patient will benefit from skilled therapy to educe on pain management and reduce as her pregnancy progresses.    Personal Factors and Comorbidities Comorbidity 2;Sex    Comorbidities right hernia repair; right ovarian cyst removed    Examination-Participation Restrictions Interpersonal Relationship;Community Activity    Stability/Clinical Decision Making Stable/Uncomplicated    Rehab Potential Excellent    PT Frequency 1x / week    PT Duration 12 weeks    PT Treatment/Interventions ADLs/Self Care Home Management;Cryotherapy;Moist Heat;Neuromuscular re-education;Therapeutic exercise;Therapeutic activities;Patient/family education;Manual techniques;Dry needling;Scar mobilization;Spinal Manipulations    PT Next Visit Plan open book, hip stretches for HEP; sidely abdominal bracing, manual work to back; diaphragmatic breathing to bulge the pelvic floor    PT Home Exercise Plan Access Code: [email or web address]    Recommended Other Services MD signed all notes    Consulted and Agree with Plan of Care Patient             Patient will benefit from skilled therapeutic intervention in order to improve the following deficits and impairments:  Decreased coordination, Decreased  range of motion, Increased fascial restricitons, Decreased endurance,  Increased muscle spasms, Pain, Decreased activity tolerance, Decreased scar mobility, Decreased strength  Visit Diagnosis: Muscle weakness (generalized)  Other lack of coordination  Right lower quadrant abdominal pain  Acute bilateral low back pain without sciatica     Problem List Patient Active Problem List   Diagnosis Date Noted   Supervision of other normal pregnancy, antepartum 06/30/2020    Eulis Foster, PT 07/16/20 1:21 PM   [facility] [address], [address] [place], [place], [address] Phone: [phone]   Fax:  [phone]  Name: Crystal Bentley MRN: [id number] Date of Birth: April 02, 1995

## 2020-07-22 ENCOUNTER — Ambulatory Visit: Payer: BC Managed Care – PPO | Admitting: Physical Therapy

## 2020-07-24 ENCOUNTER — Encounter: Payer: Self-pay | Admitting: Obstetrics and Gynecology

## 2020-07-27 ENCOUNTER — Encounter (HOSPITAL_COMMUNITY): Payer: Self-pay | Admitting: Obstetrics & Gynecology

## 2020-07-27 ENCOUNTER — Inpatient Hospital Stay (HOSPITAL_COMMUNITY)
Admission: AD | Admit: 2020-07-27 | Discharge: 2020-07-28 | Disposition: A | Discharge status: Home / Self Care | Payer: BC Managed Care – PPO | Attending: Obstetrics & Gynecology | Admitting: Obstetrics & Gynecology

## 2020-07-27 ENCOUNTER — Other Ambulatory Visit: Payer: Self-pay

## 2020-07-27 DIAGNOSIS — O219 Vomiting of pregnancy, unspecified: Secondary | ICD-10-CM | POA: Diagnosis present

## 2020-07-27 DIAGNOSIS — Z3A16 16 weeks gestation of pregnancy: Secondary | ICD-10-CM | POA: Diagnosis not present

## 2020-07-27 DIAGNOSIS — Z3492 Encounter for supervision of normal pregnancy, unspecified, second trimester: Secondary | ICD-10-CM

## 2020-07-27 LAB — CBC WITH DIFFERENTIAL/PLATELET
Abs Immature Granulocytes: 0.02 10*3/uL (ref 0.00–0.07)
Basophils Absolute: 0 10*3/uL (ref 0.0–0.1)
Basophils Relative: 0 %
Eosinophils Absolute: 0 10*3/uL (ref 0.0–0.5)
Eosinophils Relative: 0 %
HCT: 32.7 % — ABNORMAL LOW (ref 36.0–46.0)
Hemoglobin: 10.9 g/dL — ABNORMAL LOW (ref 12.0–15.0)
Immature Granulocytes: 0 %
Lymphocytes Relative: 21 %
Lymphs Abs: 1.9 10*3/uL (ref 0.7–4.0)
MCH: 28.6 pg (ref 26.0–34.0)
MCHC: 33.3 g/dL (ref 30.0–36.0)
MCV: 85.8 fL (ref 80.0–100.0)
Monocytes Absolute: 0.7 10*3/uL (ref 0.1–1.0)
Monocytes Relative: 7 %
Neutro Abs: 6.3 10*3/uL (ref 1.7–7.7)
Neutrophils Relative %: 72 %
Platelets: 343 10*3/uL (ref 150–400)
RBC: 3.81 MIL/uL — ABNORMAL LOW (ref 3.87–5.11)
RDW: 11.8 % (ref 11.5–15.5)
WBC: 8.9 10*3/uL (ref 4.0–10.5)
nRBC: 0 % (ref 0.0–0.2)

## 2020-07-27 LAB — URINALYSIS, ROUTINE W REFLEX MICROSCOPIC
Bilirubin Urine: NEGATIVE
Glucose, UA: NEGATIVE mg/dL
Ketones, ur: >80 mg/dL — AB
Nitrite: NEGATIVE
Protein, ur: NEGATIVE mg/dL
Specific Gravity, Urine: >1.03 — ABNORMAL HIGH (ref 1.005–1.030)
pH: 5.5 (ref 5.0–8.0)

## 2020-07-27 LAB — URINALYSIS, MICROSCOPIC (REFLEX): RBC / HPF: NONE SEEN RBC/hpf (ref 0–5)

## 2020-07-27 MED ORDER — LACTATED RINGERS IV BOLUS
1000.0000 mL | Freq: Once | INTRAVENOUS | Status: AC
  Administered 2020-07-27: 1000 mL via INTRAVENOUS

## 2020-07-27 MED ORDER — SODIUM CHLORIDE 0.9 % IV SOLN
25.0000 mg | Freq: Once | INTRAVENOUS | Status: DC
  Filled 2020-07-27: qty 1

## 2020-07-27 MED ORDER — SODIUM CHLORIDE 0.9 % IV SOLN
25.0000 mg | Freq: Four times a day (QID) | INTRAVENOUS | Status: DC | PRN
  Administered 2020-07-27: 25 mg via INTRAVENOUS
  Filled 2020-07-27: qty 1

## 2020-07-27 MED ORDER — ONDANSETRON HCL 4 MG/2ML IJ SOLN
4.0000 mg | Freq: Once | INTRAMUSCULAR | Status: AC
  Administered 2020-07-27: 4 mg via INTRAVENOUS
  Filled 2020-07-27: qty 2

## 2020-07-27 NOTE — MAU Provider Note (Signed)
Chief Complaint:  Nausea, Emesis, and Abdominal Pain   Event Date/Time   First Provider Initiated Contact with Patient 07/27/20 2047     HPI: Crystal Bentley is a 25 y.o. G1P0000 at [redacted]w[redacted]d who presents to maternity admissions reporting nausea and vomiting that have been present her entire pregnancy, but had started to improve until two days ago when stopped being able to keep down anything orally. She had recently run out of phenergan and been told to wait until her appointment on Wednesday to see if the nausea had eased. N/V set in 3 days after her last dose of phenergan. Now feeling weak, lightheaded, and feeling some abdominal pain with vomiting but no other physical complaints.   Pregnancy Course: Receives [facility], prenatal records reviewed  Past Medical History:  Diagnosis Date   Medical history non-contributory    OB History  Gravida Para Term Preterm AB Living  1 0 0 0 0 0  SAB IAB Ectopic Multiple Live Births  0 0 0 0 0    # Outcome Date GA Lbr Len/2nd Weight Sex Delivery Anes PTL Lv  1 Current            Past Surgical History:  Procedure Laterality Date   INGUINAL HERNIA REPAIR     4 yrs    OVARIAN CYST REMOVAL     6 yrs    Family History  Problem Relation Age of Onset   Healthy Mother    Stroke Father    Social History   Tobacco Use   Smoking status: Never   Smokeless tobacco: Never  Vaping Use   Vaping Use: Never used  Substance Use Topics   Alcohol use: Never   Drug use: Never   No Known Allergies Medications Prior to Admission  Medication Sig Dispense Refill Last Dose   Doxylamine-Pyridoxine (DICLEGIS) 10-10 MG TBEC Take 2 tablets by mouth at bedtime. If symptoms persist, add one tablet in the morning and one in the afternoon 100 tablet 5 07/26/2020   Prenatal Vit-Fe Phos-FA-Omega (VITAFOL GUMMIES) 3.33-0.333-34.8 MG CHEW Chew 2 tablets by mouth daily. 60 tablet 6 07/26/2020   promethazine (PHENERGAN) 25 MG suppository Place 1 suppository (25  mg total) rectally every 6 (six) hours as needed for nausea or vomiting. 12 each 1 Past Week   ondansetron (ZOFRAN ODT) 4 MG disintegrating tablet Take 1 tablet (4 mg total) by mouth every 8 (eight) hours as needed for nausea or vomiting. 20 tablet 0    OVER THE COUNTER MEDICATION Take 1 tablet by mouth as needed (uti infection). (Patient not taking: Reported on 05/18/2020)      polycarbophil (FIBERCON) 625 MG tablet Take 1 tablet (625 mg total) by mouth daily. 30 tablet 1    polyethylene glycol (MIRALAX) 17 g packet Take 17 g by mouth daily. 14 each 2    Prenatal Vit-Fe Fumarate-FA (PREPLUS) 27-1 MG TABS Take 1 tablet by mouth daily. 30 tablet 12    promethazine (PHENERGAN) 25 MG tablet Take 1 tablet (25 mg total) by mouth every 6 (six) hours as needed for nausea or vomiting. 30 tablet 2    traMADol (ULTRAM) 50 MG tablet Take 1 tablet (50 mg total) by mouth every 6 (six) hours as needed. 15 tablet 0    I have reviewed patient's Past Medical Hx, Surgical Hx, Family Hx, Social Hx, medications and allergies.   ROS:  Pertinent items noted in HPI and remainder of comprehensive ROS otherwise negative.  Physical Exam  Patient  Vitals for the past 24 hrs:  BP Temp Temp src Pulse Resp SpO2 Height Weight  07/27/20 1735 111/83 98.1 F (36.7 C) Oral 99 16 100 % -- --  07/27/20 1730 -- -- -- -- -- -- 5\' 3"  (1.6 m) 98 lb 3.2 oz (44.5 kg)   Constitutional: Well-developed, well-nourished female in mild physical distress (curled in the fetal position, covered in several blankets).  Cardiovascular: normal rate & rhythm, no murmur Respiratory: normal effort, lung sounds clear throughout GI: Abd soft, non-tender, gravid appropriate for gestational age. Pos BS x 4 MS: Extremities nontender, no edema, normal ROM Neurologic: Alert and oriented x 4.  GU: no CVA tenderness Pelvic exam deferred FHR: 159   Labs: Results for orders placed or performed during the hospital encounter of 07/27/20 (from the past 24  hour(s))  Urinalysis, Routine w reflex microscopic Urine, Clean Catch     Status: Abnormal   Collection Time: 07/27/20  7:54 PM  Result Value Ref Range   Color, Urine YELLOW YELLOW   APPearance HAZY (A) CLEAR   Specific Gravity, Urine >1.030 (H) 1.005 - 1.030   pH 5.5 5.0 - 8.0   Glucose, UA NEGATIVE NEGATIVE mg/dL   Hgb urine dipstick TRACE (A) NEGATIVE   Bilirubin Urine NEGATIVE NEGATIVE   Ketones, ur >80 (A) NEGATIVE mg/dL   Protein, ur NEGATIVE NEGATIVE mg/dL   Nitrite NEGATIVE NEGATIVE   Leukocytes,Ua TRACE (A) NEGATIVE  Urinalysis, Microscopic (reflex)     Status: Abnormal   Collection Time: 07/27/20  7:54 PM  Result Value Ref Range   RBC / HPF NONE SEEN 0 - 5 RBC/hpf   WBC, UA 0-5 0 - 5 WBC/hpf   Bacteria, UA MANY (A) NONE SEEN   Squamous Epithelial / LPF 0-5 0 - 5   Mucus PRESENT    Urine-Other LESS THAN 10 mL OF URINE SUBMITTED     Imaging:  No results found.  MAU Course: Orders Placed This Encounter  Procedures   Urinalysis, Routine w reflex microscopic Urine, Clean Catch   Urinalysis, Microscopic (reflex)   CBC with Differential/Platelet   Comprehensive metabolic panel   Meds ordered this encounter  Medications   DISCONTD: promethazine (PHENERGAN) 25 mg in sodium chloride 0.9 % 1,000 mL infusion   lactated ringers bolus 1,000 mL   promethazine (PHENERGAN) 25 mg in sodium chloride 0.9 % 50 mL IVPB   ondansetron (ZOFRAN) injection 4 mg   MDM: LR bolus + zofran IV and phenergan IVPB ordered with complete relief of symptoms and successful PO challenge.  Refill of phenergan suppositories sent to pharmacy.  Assessment & Plan: Nausea/vomiting in pregnancy - Plan: Discharge patient  Fetal heart tones present, second trimester - Plan: Discharge patient  Nausea and vomiting during pregnancy - Plan: promethazine (PHENERGAN) 25 MG suppository, Discharge patient  Discharge home in stable condition with return precautions.    Follow-up Information     [facility] Go on 07/29/2020.   Specialty: Obstetrics and Gynecology Why: at 3:30pm for prenatal appointment with 07/31/2020, NP Contact information: [address] [place] [phone]                Allergies as of 07/28/2020   No Known Allergies      Medication List     STOP taking these medications    FiberCon 625 MG tablet Generic drug: polycarbophil   ondansetron 4 MG disintegrating tablet Commonly known as: Zofran ODT  polyethylene glycol 17 g packet Commonly known as: MiraLax   PrePLUS 27-1 MG Tabs       TAKE these medications    Doxylamine-Pyridoxine 10-10 MG Tbec Commonly known as: Diclegis Take 2 tablets by mouth at bedtime. If symptoms persist, add one tablet in the morning and one in the afternoon   promethazine 25 MG suppository Commonly known as: PHENERGAN Place 1 suppository (25 mg total) rectally every 6 (six) hours as needed for nausea or vomiting. What changed: Another medication with the same name was removed. Continue taking this medication, and follow the directions you see here.   traMADol 50 MG tablet Commonly known as: ULTRAM Take 1 tablet (50 mg total) by mouth every 6 (six) hours as needed.       ASK your doctor about these medications    OVER THE COUNTER MEDICATION Take 1 tablet by mouth as needed (uti infection).   Vitafol Gummies 3.33-0.333-34.8 MG Chew Chew 2 tablets by mouth daily. Ask about: Should I take this medication?       Edd Arbour, CNM, MSN, IBCLC Certified Nurse Midwife, [facility]

## 2020-07-27 NOTE — MAU Note (Signed)
Crystal Bentley is a 25 y.o. at [redacted]w[redacted]d here in MAU reporting: vomiting since Saturday night. Is not able to keep anything down. Also having abdominal pain.  Onset of complaint: ongoing  Pain score: 5/10  Vitals:   07/27/20 1735  BP: 111/83  Pulse: 99  Resp: 16  Temp: 98.1 F (36.7 C)  SpO2: 100%     FHT:159  Lab orders placed from triage: UA

## 2020-07-27 NOTE — MAU Note (Signed)
Pt stated hse had been taking Phengan supp. For her nausea with good relief. Ran out last week and called the office for a renewal. Nurse told her to wait until her appoint on Wed to see if she really still needed it. Pt stated that for the first 2-3 days she felt good but Saturday started vomiting again and none of the other medications she had helped.

## 2020-07-28 LAB — COMPREHENSIVE METABOLIC PANEL
ALT: 18 U/L (ref 0–44)
AST: 19 U/L (ref 15–41)
Albumin: 3.1 g/dL — ABNORMAL LOW (ref 3.5–5.0)
Alkaline Phosphatase: 48 U/L (ref 38–126)
Anion gap: 6 (ref 5–15)
BUN: <5 mg/dL — ABNORMAL LOW (ref 6–20)
CO2: 24 mmol/L (ref 22–32)
Calcium: 9 mg/dL (ref 8.9–10.3)
Chloride: 103 mmol/L (ref 98–111)
Creatinine, Ser: 0.48 mg/dL (ref 0.44–1.00)
GFR, Estimated: >60 mL/min (ref >60)
Glucose, Bld: 82 mg/dL (ref 70–99)
Potassium: 3.5 mmol/L (ref 3.5–5.1)
Sodium: 133 mmol/L — ABNORMAL LOW (ref 135–145)
Total Bilirubin: 0.8 mg/dL (ref 0.3–1.2)
Total Protein: 6.6 g/dL (ref 6.5–8.1)

## 2020-07-28 MED ORDER — PROMETHAZINE HCL 25 MG RE SUPP: 25.0000 mg | Freq: Four times a day (QID) | RECTAL | 4 refills | Status: DC | PRN

## 2020-07-29 ENCOUNTER — Other Ambulatory Visit: Payer: Self-pay

## 2020-07-29 ENCOUNTER — Ambulatory Visit (INDEPENDENT_AMBULATORY_CARE_PROVIDER_SITE_OTHER): Payer: Medicaid Other | Admitting: Women's Health

## 2020-07-29 VITALS — BP 110/74 | HR 92 | Wt 101.0 lb

## 2020-07-29 DIAGNOSIS — D573 Sickle-cell trait: Secondary | ICD-10-CM

## 2020-07-29 DIAGNOSIS — Z3A16 16 weeks gestation of pregnancy: Secondary | ICD-10-CM

## 2020-07-29 DIAGNOSIS — D563 Thalassemia minor: Secondary | ICD-10-CM

## 2020-07-29 DIAGNOSIS — O219 Vomiting of pregnancy, unspecified: Secondary | ICD-10-CM

## 2020-07-29 DIAGNOSIS — Z3480 Encounter for supervision of other normal pregnancy, unspecified trimester: Secondary | ICD-10-CM

## 2020-07-29 NOTE — Addendum Note (Signed)
Addended by: Marylen Ponto on: 07/29/2020 04:39 PM   Modules accepted: Orders

## 2020-07-29 NOTE — Progress Notes (Signed)
Subjective:  Crystal Bentley is a 25 y.o. G1P0000 at [redacted]w[redacted]d being seen today for ongoing prenatal care.  She is currently monitored for the following issues for this low-risk pregnancy and has Supervision of other normal pregnancy, antepartum; Nausea/vomiting in pregnancy; Sickle cell trait (HCC); and Alpha thalassemia silent carrier on their problem list.  Patient reports nausea and vomiting.  Contractions: Not present. Vag. Bleeding: None.   . Denies leaking of fluid.   The following portions of the patient's history were reviewed and updated as appropriate: allergies, current medications, past family history, past medical history, past social history, past surgical history and problem list. Problem list updated.  Objective:   Vitals:   07/29/20 1541  BP: 110/74  Pulse: 92  Weight: 101 lb (45.8 kg)    Fetal Status: Fetal Heart Rate (bpm): 160         General:  Alert, oriented and cooperative. Patient is in no acute distress.  Skin: Skin is warm and dry. No rash noted.   Cardiovascular: Normal heart rate noted  Respiratory: Normal respiratory effort, no problems with respiration noted  Abdomen: Soft, gravid, appropriate for gestational age. Pain/Pressure: Present     Pelvic: Vag. Bleeding: None     Cervical exam deferred        Extremities: Normal range of motion.     Mental Status: Normal mood and affect. Normal behavior. Normal judgment and thought content.   Urinalysis:      Assessment and Plan:  Pregnancy: G1P0000 at [redacted]w[redacted]d  1. Supervision of other normal pregnancy, antepartum -AFP today  2. Nausea/vomiting in pregnancy -pt reports she was taking Diclegis and phenergan, and then felt better so she stopped taking them -pt advised she was feeling better because she was taking the medications and so she should restart -pt confirms she has the medications at home and does not need refills at this time -pt also reports she has phenergan suppositories  3. [redacted] weeks gestation  of pregnancy  4. Sickle cell trait (HCC) - AMB MFM GENETICS REFERRAL  5. Alpha thalassemia silent carrier - AMB MFM GENETICS REFERRAL   Preterm labor symptoms and general obstetric precautions including but not limited to vaginal bleeding, contractions, leaking of fluid and fetal movement were reviewed in detail with the patient. I discussed the assessment and treatment plan with the patient. The patient was provided an opportunity to ask questions and all were answered. The patient agreed with the plan and demonstrated an understanding of the instructions. The patient was advised to call back or seek an in-person office evaluation/go to MAU at [facility] for any urgent or concerning symptoms. Please refer to After Visit Summary for other counseling recommendations.  Return in about 4 weeks (around 08/26/2020) for in-person LOB/APP OK, needs genetics appt.   Johnaton Sonneborn, [name], NP

## 2020-07-29 NOTE — Progress Notes (Signed)
Pt states she is taking Phenergan with little relief but is still having some vomiting.

## 2020-07-29 NOTE — Patient Instructions (Addendum)
Maternity Assessment Unit (MAU)  The Maternity Assessment Unit (MAU) is located at the [facility] at [facility]. The address is: [address] [place], [place], [place] [address]. Please see map below for additional directions.    The Maternity Assessment Unit is designed to help you during your pregnancy, and for up to 6 weeks after delivery, with any pregnancy- or postpartum-related emergencies, if you think you are in labor, or if your water has broken. For example, if you experience nausea and vomiting, vaginal bleeding, severe abdominal or pelvic pain, elevated blood pressure or other problems related to your pregnancy or postpartum time, please come to the Maternity Assessment Unit for assistance.     Second Trimester of Pregnancy  The second trimester of pregnancy is from week 13 through week 27. This is months 4 through 6 of pregnancy. The second trimester is often a time when you feel your best. Your body has adjusted to being pregnant, and you begin to feelbetter physically. During the second trimester: Morning sickness has lessened or stopped completely. You may have more energy. You may have an increase in appetite. The second trimester is also a time when the unborn baby (fetus) is growing rapidly. At the end of the sixth month, the fetus may be up to 12 inches long and weigh about 1 pounds. You will likely begin to feel the baby move (quickening) between 16 and 20 weeks of pregnancy. Body changes during your second trimester Your body continues to go through many changes during your second trimester.The changes vary and generally return to normal after the baby is born. Physical changes Your weight will continue to increase. You will notice your lower abdomen bulging out. You may begin to get stretch marks on your hips, abdomen, and breasts. Your breasts will continue to grow and to become tender. Dark spots or blotches (chloasma or mask of  pregnancy) may develop on your face. A dark line from your belly button to the pubic area (linea nigra) may appear. You may have changes in your hair. These can include thickening of your hair, rapid growth, and changes in texture. Some people also have hair loss during or after pregnancy, or hair that feels dry or thin. Health changes You may develop headaches. You may have heartburn. You may develop constipation. You may develop hemorrhoids or swollen, bulging veins (varicose veins). Your gums may bleed and may be sensitive to brushing and flossing. You may urinate more often because the fetus is pressing on your bladder. You may have back pain. This is caused by: Weight gain. Pregnancy hormones that are relaxing the joints in your pelvis. A shift in weight and the muscles that support your balance. Follow these instructions at home: Medicines Follow your health care provider's instructions regarding medicine use. Specific medicines may be either safe or unsafe to take during pregnancy. Do not take any medicines unless approved by your health care provider. Take a prenatal vitamin that contains at least 600 micrograms (mcg) of folic acid. Eating and drinking Eat a healthy diet that includes fresh fruits and vegetables, whole grains, good sources of protein such as meat, eggs, or tofu, and low-fat dairy products. Avoid raw meat and unpasteurized juice, milk, and cheese. These carry germs that can harm you and your baby. You may need to take these actions to prevent or treat constipation: Drink enough fluid to keep your urine pale yellow. Eat foods that are high in fiber, such as beans, whole  grains, and fresh fruits and vegetables. Limit foods that are high in fat and processed sugars, such as fried or sweet foods. Activity Exercise only as directed by your health care provider. Most people can continue their usual exercise routine during pregnancy. Try to exercise for 30 minutes at least  5 days a week. Stop exercising if you develop contractions in your uterus. Stop exercising if you develop pain or cramping in the lower abdomen or lower back. Avoid exercising if it is very hot or humid or if you are at a high altitude. Avoid heavy lifting. If you choose to, you may have sex unless your health care provider tells you not to. Relieving pain and discomfort Wear a supportive bra to prevent discomfort from breast tenderness. Take warm sitz baths to soothe any pain or discomfort caused by hemorrhoids. Use hemorrhoid cream if your health care provider approves. Rest with your legs raised (elevated) if you have leg cramps or low back pain. If you develop varicose veins: Wear support hose as told by your health care provider. Elevate your feet for 15 minutes, 3-4 times a day. Limit salt in your diet. Safety Wear your seat belt at all times when driving or riding in a car. Talk with your health care provider if someone is verbally or physically abusive to you. Lifestyle Do not use hot tubs, steam rooms, or saunas. Do not douche. Do not use tampons or scented sanitary pads. Avoid cat litter boxes and soil used by cats. These carry germs that can cause birth defects in the baby and possibly loss of the fetus by miscarriage or stillbirth. Do not use herbal remedies, alcohol, illegal drugs, or medicines that are not approved by your health care provider. Chemicals in these products can harm your baby. Do not use any products that contain nicotine or tobacco, such as cigarettes, e-cigarettes, and chewing tobacco. If you need help quitting, ask your health care provider. General instructions During a routine prenatal visit, your health care provider will do a physical exam and other tests. He or she will also discuss your overall health. Keep all follow-up visits. This is important. Ask your health care provider for a referral to a local prenatal education class. Ask for help if you have  counseling or nutritional needs during pregnancy. Your health care provider can offer advice or refer you to specialists for help with various needs. Where to find more information American Pregnancy Association: americanpregnancy.org [employer]: https://www.todd-brady.net/ Office on [employer] Health: MightyReward.co.nz Contact a health care provider if you have: A headache that does not go away when you take medicine. Vision changes or you see spots in front of your eyes. Mild pelvic cramps, pelvic pressure, or nagging pain in the abdominal area. Persistent nausea, vomiting, or diarrhea. A bad-smelling vaginal discharge or foul-smelling urine. Pain when you urinate. Sudden or extreme swelling of your face, hands, ankles, feet, or legs. A fever. Get help right away if you: Have fluid leaking from your vagina. Have spotting or bleeding from your vagina. Have severe abdominal cramping or pain. Have difficulty breathing. Have chest pain. Have fainting spells. Have not felt your baby move for the time period told by your health care provider. Have new or increased pain, swelling, or redness in an arm or leg. Summary The second trimester of pregnancy is from week 13 through week 27 (months 4 through 6). Do not use herbal remedies, alcohol, illegal drugs, or medicines that are not approved by your  health care provider. Chemicals in these products can harm your baby. Exercise only as directed by your health care provider. Most people can continue their usual exercise routine during pregnancy. Keep all follow-up visits. This is important. This information is not intended to replace advice given to you by your health care provider. Make sure you discuss any questions you have with your healthcare provider. Document Revised: 07/03/2019 Document Reviewed: 05/09/2019 Elsevier Patient Education  2022 Elsevier Inc.       Morning  Sickness  Morning sickness is when a woman feels nauseous during pregnancy. This nauseous feeling may or may not come with vomiting. It often occurs in the morning, but it can be a problem at any time of day. Morning sickness is most common during the first trimester. In some cases, it may continue throughout pregnancy. Although morning sickness is unpleasant, it is usually harmless unless the woman develops severe and continual vomiting (hyperemesis gravidarum), a condition that requires more intense treatment. What are the causes? The exact cause of this condition is not known, but it seems to be related tonormal hormonal changes that occur in pregnancy. What increases the risk? You are more likely to develop this condition if: You experienced nausea or vomiting before your pregnancy. You had morning sickness during a previous pregnancy. You are pregnant with more than one baby, such as twins. What are the signs or symptoms? Symptoms of this condition include: Nausea. Vomiting. How is this diagnosed? This condition is usually diagnosed based on your signs and symptoms. How is this treated? In many cases, treatment is not needed for this condition. Making some changes to what you eat may help to control symptoms. Your health care provider may also prescribe or recommend: Vitamin B6 supplements. Anti-nausea medicines. Ginger. Follow these instructions at home: Medicines Take over-the-counter and prescription medicines only as told by your health care provider. Do not use any prescription, over-the-counter, or herbal medicines for morning sickness without first talking with your health care provider. Take multivitamins before getting pregnant. This can prevent or decrease the severity of morning sickness in most women. Eating and drinking Eat a piece of dry toast or crackers before getting out of bed in the morning. Eat 5 or 6 small meals a day. Eat dry and bland foods, such as rice or a  baked potato. Foods that are high in carbohydrates are often helpful. Avoid greasy, fatty, and spicy foods. Have someone cook for you if the smell of any food causes nausea and vomiting. If you feel nauseous after taking prenatal vitamins, take the vitamins at night or with a snack. Eat a protein snack between meals if you are hungry. Nuts, yogurt, and cheese are good options. Drink fluids throughout the day. Try ginger ale made with real ginger, ginger tea made from fresh grated ginger, or ginger candies. General instructions Do not use any products that contain nicotine or tobacco. These products include cigarettes, chewing tobacco, and vaping devices, such as e-cigarettes. If you need help quitting, ask your health care provider. Get an air purifier to keep the air in your house free of odors. Get plenty of fresh air. Try to avoid odors that trigger your nausea. Consider trying these methods to help relieve symptoms: Wearing an acupressure wristband. These wristbands are often worn for seasickness. Acupuncture. Contact a health care provider if: Your home remedies are not working and you need medicine. You feel dizzy or light-headed. You are losing weight. Get help right away if: You have persistent  and uncontrolled nausea and vomiting. You faint. You have severe pain in your abdomen. Summary Morning sickness is when a woman feels nauseous during pregnancy. This nauseous feeling may or may not come with vomiting. Morning sickness is most common during the first trimester. It often occurs in the morning, but it can be a problem at any time of day. In many cases, treatment is not needed for this condition. Making some changes to what you eat may help to control symptoms. This information is not intended to replace advice given to you by your health care provider. Make sure you discuss any questions you have with your healthcare provider. Document Revised: 09/09/2019 Document Reviewed:  08/19/2019 Elsevier Patient Education  2022 Elsevier Inc.       Alpha-Fetoprotein Test Why am I having this test? The alpha-fetoprotein test is a lab test most commonly used for pregnant women to help screen for birth defects in their unborn baby. It can be used to screen for chromosome (DNA) abnormalities, problems with the brain or spinal cord, or problems with the abdominal wall of the unborn baby (fetus). The alpha-fetoprotein test may also be done for men or nonpregnant women tocheck for certain cancers. What is being tested? This test measures the amount of alpha-fetoprotein (AFP) in your blood. AFP is a protein that is made by the liver. Levels can be detected in the mother's blood during pregnancy, starting at 10 weeks and peaking at 16-18 weeks of the pregnancy. Abnormal levels can sometimes be a sign of a birth defect in thebaby. Certain cancers can cause a high level of AFP in men and nonpregnant women. What kind of sample is taken?  A blood sample is required for this test. It is usually collected by insertinga needle into a blood vessel. How are the results reported? Your test results will be reported as values. Your health care provider will compare your results to normal ranges that were established after testing a large group of people (reference values). Reference values may vary among labs and hospitals. For this test, common reference values are: Adult: Less than 40 ng/mL or less than 40 mcg/L (SI units). Child younger than 1 year: Less than 30 ng/mL. If you are pregnant, the values may also vary based on how long you have beenpregnant. What do the results mean? Results that are above the reference values in pregnant women may indicate the following for the baby: Neural tube defects, such as abnormalities of the spinal cord or brain. Abdominal wall defects. Multiple pregnancy such as twins. Fetal distress or fetal death. Results that are above the reference values in  men or nonpregnant women may indicate: Reproductive cancers, such as ovarian or testicular cancer. Liver cancer. Liver cell death. Other types of cancer. Very low levels of AFP in pregnant women may indicate Down syndrome for thebaby. Talk with your health care provider about what your results mean. Questions to ask your health care provider Ask your health care provider, or the department that is doing the test: When will my results be ready? How will I get my results? What are my treatment options? What other tests do I need? What are my next steps? Summary The alpha-fetoprotein test is done on pregnant women to help screen for birth defects in their unborn baby. Certain cancers can cause a high level of AFP in men and nonpregnant women. For this test, a blood sample is usually collected by inserting a needle into a blood vessel. Talk with your health  care provider about what your results mean. This information is not intended to replace advice given to you by your health care provider. Make sure you discuss any questions you have with your healthcare provider. Document Revised: 08/16/2019 Document Reviewed: 08/16/2019 Elsevier Patient Education  2022 Elsevier Inc.                        Safe Medications in Pregnancy    Acne: Benzoyl Peroxide Salicylic Acid  Backache/Headache: Tylenol: 2 regular strength every 4 hours OR              2 Extra strength every 6 hours  Colds/Coughs/Allergies: Benadryl (alcohol free) 25 mg every 6 hours as needed Breath right strips Claritin Cepacol throat lozenges Chloraseptic throat spray Cold-Eeze- up to three times per day Cough drops, alcohol free Flonase (by prescription only) Guaifenesin Mucinex Robitussin DM (plain only, alcohol free) Saline nasal spray/drops Sudafed (pseudoephedrine) & Actifed ** use only after [redacted] weeks gestation and if you do not have high blood pressure Tylenol Vicks Vaporub Zinc lozenges Zyrtec    Constipation: Colace Ducolax suppositories Fleet enema Glycerin suppositories Metamucil Milk of magnesia Miralax Senokot Smooth move tea  Diarrhea: Kaopectate Imodium A-D  *NO pepto Bismol  Hemorrhoids: Anusol Anusol HC Preparation H Tucks  Indigestion: Tums Maalox Mylanta Zantac  Pepcid  Insomnia: Benadryl (alcohol free) 25mg  every 6 hours as needed Tylenol PM Unisom, no Gelcaps  Leg Cramps: Tums MagGel  Nausea/Vomiting:  Bonine Dramamine Emetrol Ginger extract Sea bands Meclizine  Nausea medication to take during pregnancy:  Unisom (doxylamine succinate 25 mg tablets) Take one tablet daily at bedtime. If symptoms are not adequately controlled, the dose can be increased to a maximum recommended dose of two tablets daily (1/2 tablet in the morning, 1/2 tablet mid-afternoon and one at bedtime). Vitamin B6 100mg  tablets. Take one tablet twice a day (up to 200 mg per day).  Skin Rashes: Aveeno products Benadryl cream or 25mg  every 6 hours as needed Calamine Lotion 1% cortisone cream  Yeast infection: Gyne-lotrimin 7 Monistat 7   **If taking multiple medications, please check labels to avoid duplicating the same active ingredients **take medication as directed on the label ** Do not exceed 4000 mg of tylenol in 24 hours **Do not take medications that contain aspirin or ibuprofen       Preterm Labor The normal length of a pregnancy is 39-41 weeks. Preterm labor is when labor starts before 37 completed weeks of pregnancy. Babies who are born prematurely and survive may not be fully developed and may be at an increased risk for long-term problems such as cerebral palsy, developmental delays, and vision andhearing problems. Babies who are born too early may have problems soon after birth. Premature babies may have problems regulating blood sugar, body temperature, heart rate, and breathing rate. These babies often have trouble with feeding. The  risk ofhaving problems is highest for babies who are born before 34 weeks of pregnancy. What are the causes? The exact cause of this condition is not known. What increases the risk? You are more likely to have preterm labor if you have certain risk factors that relate to your medical history, problems with present and past pregnancies, andlifestyle factors. Medical history You have abnormalities of the uterus, including a short cervix. You have STIs (sexually transmitted infections) or other infections of the urinary tract and the vagina. You have chronic illnesses, such as blood clotting problems, diabetes, or high blood pressure. You  are overweight or underweight. Present and past pregnancies You have had preterm labor before. You are pregnant with twins or other multiples. You have been diagnosed with a condition in which the placenta covers your cervix (placenta previa). You waited less than 18 months between giving birth and becoming pregnant again. Your unborn baby has some abnormalities. You have vaginal bleeding during pregnancy. You became pregnant through in vitro fertilization (IVF). Lifestyle and environmental factors You use tobacco products or drink alcohol. You use drugs. You have stress and no social support. You experience domestic violence. You are exposed to certain chemicals or environmental pollutants. Other factors You are younger than age 70 or older than age 41. What are the signs or symptoms? Symptoms of this condition include: Cramps similar to those that can happen during a menstrual period. The cramps may happen with diarrhea. Pain in the abdomen or lower back. Regular contractions that may feel like tightening of the abdomen. A feeling of increased pressure in the pelvis. Increased watery or bloody mucus discharge from the vagina. Water breaking (ruptured amniotic sac). How is this diagnosed? This condition is diagnosed based on: Your medical history  and a physical exam. A pelvic exam. An ultrasound. Monitoring your uterus for contractions. Other tests, including: A swab of the cervix to check for a chemical called fetal fibronectin. Urine tests. How is this treated? Treatment for this condition depends on the length of your pregnancy, your condition, and the health of your baby. Treatment may include: Taking medicines, such as: Hormone medicines. These may be given early in pregnancy to help support the pregnancy. Medicines to stop contractions. Medicines to help mature the baby's lungs. These may be prescribed if the risk of delivery is high. Medicines to help protect your baby from brain and nerve complications such as cerebral palsy. Bed rest. If the labor happens before 34 weeks of pregnancy, you may need to stay in the hospital. Delivery of the baby. Follow these instructions at home:  Do not use any products that contain nicotine or tobacco. These products include cigarettes, chewing tobacco, and vaping devices, such as e-cigarettes. If you need help quitting, ask your health care provider. Do not drink alcohol. Take over-the-counter and prescription medicines only as told by your health care provider. Rest as told by your health care provider. Return to your normal activities as told by your health care provider. Ask your health care provider what activities are safe for you. Keep all follow-up visits. This is important. How is this prevented? To increase your chance of having a full-term pregnancy: Do not use drugs or take medicines that have not been prescribed to you during your pregnancy. Talk with your health care provider before taking any herbal supplements, even if you have been taking them regularly. Make sure you gain a healthy amount of weight during your pregnancy. Watch for infection. If you think that you might have an infection, get it checked right away. Symptoms of infection may include: Fever. Abnormal  vaginal discharge or discharge that smells bad. Pain or burning with urination. Needing to urinate urgently. Frequently urinating or passing small amounts of urine frequently. Blood in your urine or urine that smells bad or unusual. Where to find more information U.S. Department of Health and Cytogeneticist on Women's Health: http://hoffman.com/ [employer]: www.acog.org Centers for Disease Control and Prevention, Preterm Birth: FootballExhibition.com.br Contact a health care provider if: You think you are going into preterm labor. You have  signs or symptoms of preterm labor. You have symptoms of infection. Get help right away if: You are having regular, painful contractions every 5 minutes or less. Your water breaks. Summary Preterm labor is labor that starts before you reach 37 weeks of pregnancy. Delivering your baby early increases your baby's risk of developing long-term problems. You are more likely to have preterm labor if you have certain risk factors that relate to your medical history, problems with present and past pregnancies, and lifestyle factors. Keep all follow-up visits. This is important. Contact a health care provider if you have signs or symptoms of preterm labor. This information is not intended to replace advice given to you by your health care provider. Make sure you discuss any questions you have with your healthcare provider. Document Revised: 01/28/2020 Document Reviewed: 01/28/2020 Elsevier Patient Education  2022 [employer].

## 2020-07-30 ENCOUNTER — Ambulatory Visit: Payer: BC Managed Care – PPO | Admitting: Physical Therapy

## 2020-07-30 ENCOUNTER — Encounter: Payer: Self-pay | Admitting: Physical Therapy

## 2020-07-30 DIAGNOSIS — M6281 Muscle weakness (generalized): Secondary | ICD-10-CM

## 2020-07-30 DIAGNOSIS — R278 Other lack of coordination: Secondary | ICD-10-CM

## 2020-07-30 DIAGNOSIS — R1031 Right lower quadrant pain: Secondary | ICD-10-CM

## 2020-07-30 DIAGNOSIS — M545 Low back pain, unspecified: Secondary | ICD-10-CM

## 2020-07-30 NOTE — Patient Instructions (Signed)
Access Code: [email or web address] URL: [email or web address] Date: 07/30/2020 Prepared by: Eulis Foster  Exercises Supine Diaphragmatic Breathing - 3 x daily - 7 x weekly - 1 sets - 10 reps Doorway Pec Stretch at 90 Degrees Abduction - 1 x daily - 7 x weekly - 1 sets - 3 reps - 15 sec hold Chest and Bicep Stretch - Arms Behind Back - 2 x daily - 7 x weekly - 1 sets - 1 reps - 15 sec hold Child's Pose with Sidebending - 1 x daily - 7 x weekly - 1 sets - 2 reps - 30 sec hold Sidelying Thoracic Rotation with Open Book - 1 x daily - 7 x weekly - 1 sets - 10 reps Seated Quadratus Lumborum Stretch in Chair - 1 x daily - 7 x weekly - 3 sets - 10 reps [facility] [address] [place], [place] [address] Phone # [phone] Fax [phone]

## 2020-07-30 NOTE — Therapy (Signed)
[facility] [address] [name], [place], [address] Phone: [phone]   Fax:  [phone]  Physical Therapy Treatment  Patient Details  Name: Crystal Bentley MRN: [id number] Date of Birth: 18-May-1995 Referring Provider (PT): Dr. Mora Bellman   Encounter Date: 07/30/2020   PT End of Session - 07/30/20 1312     Visit Number 6    Date for PT Re-Evaluation 09/23/20    Authorization Type [employer]    Authorization Time Period 5/25-7/24    Authorization - Visit Number 2    Authorization - Number of Visits 10    PT Start Time 7681    PT Stop Time 1308    PT Time Calculation (min) 38 min    Activity Tolerance Patient tolerated treatment well;Patient limited by fatigue;No increased pain    Behavior During Therapy WFL for tasks assessed/performed             Past Medical History:  Diagnosis Date   Medical history non-contributory     Past Surgical History:  Procedure Laterality Date   INGUINAL HERNIA REPAIR     4 yrs    OVARIAN CYST REMOVAL     6 yrs     There were no vitals filed for this visit.   Subjective Assessment - 07/30/20 1232     Subjective I am doing good this morning. Patient is into the second trimester. the abdominal pain is from the vomiting.    Patient Stated Goals reduce pain    Currently in Pain? Yes    Pain Score 6     Pain Location Back    Pain Orientation Upper;Lower    Pain Descriptors / Indicators Aching    Pain Type Acute pain    Pain Onset More than a month ago    Pain Frequency Intermittent    Aggravating Factors  at night when sleeping,    Pain Relieving Factors rub the back, lay down for a few minutes    Multiple Pain Sites No                [facility] PT Assessment - 07/30/20 0001       Assessment   Medical Diagnosis N94.10 Dyspareunia in female    Referring Provider (PT) Dr. Mora Bellman    Prior Therapy none      Precautions   Precautions None       Restrictions   Weight Bearing Restrictions No      Home Environment   Living Environment Private residence      Prior Function   Level of Independence Independent      Cognition   Overall Cognitive Status Within Functional Limits for tasks assessed      AROM   Lumbar - Right Side Bend full with pain    Lumbar - Left Side Bend full with pain    Lumbar - Right Rotation full with pain      Strength   Right Hip ABduction 4-/5    Left Hip ABduction 4-/5      Palpation   SI assessment  ASIS                        Pelvic Floor Special Questions - 07/30/20 0001     Currently Sexually Active Yes    Is this Painful No    Urinary Leakage No    Fecal incontinence No    Exam Type Deferred   presently pregnant  [facility] Adult PT Treatment/Exercise - 07/30/20 0001       Self-Care   Self-Care Other Self-Care Comments    Other Self-Care Comments  using a tennis ball to massage the thoracic and lumbar paraspianls in stanging      Lumbar Exercises: Stretches   Quadruped Mid Back Stretch 2 reps;30 seconds    Quadruped Mid Back Stretch Limitations childs  pose to the right and left    Other Lumbar Stretch Exercise sitting with lateral trunk stretch bil. 15 seconds    Other Lumbar Stretch Exercise doorway stretch holding for 15 sec. 3 times      Lumbar Exercises: Sidelying   Other Sidelying Lumbar Exercises open boodk to increase spinal motion 10 times each side      Lumbar Exercises: Quadruped   Single Arm Raise Right;Left;5 reps    Single Arm Raise Weights (lbs) when lift the left arm the right lumbar paraspinals would hurt      Manual Therapy   Manual Therapy Soft tissue mobilization    Soft tissue mobilization manual mobilization to the right thoraco lumbar paraspinals and quadratus                    PT Education - 07/30/20 1305     Education Details Access Code: [email or web address]    Person(s) Educated Patient    Methods  Explanation;Demonstration;Verbal cues;Handout    Comprehension Returned demonstration;Verbalized understanding              PT Short Term Goals - 07/30/20 1313       PT SHORT TERM GOAL #1   Title independent with initial HEP including scar massage and perineal massage    Time 4    Period Weeks    Status Achieved      PT SHORT TERM GOAL #2   Title ability to bulge the pelvic floor with diaphragmatic breathing    Time 4    Period Weeks    Status Not Met               PT Long Term Goals - 07/30/20 1314       PT LONG TERM GOAL #1   Title independent with advanced HEP    Time 12    Period Weeks    Status Achieved      PT LONG TERM GOAL #2   Title able to have penile penetration vaginally with pain level </= 0-1/10 due to reduction of scar tissue and improve tissue mobitliy    Time 12    Period Weeks    Status Deferred      PT LONG TERM GOAL #3   Title able to perform daily activities with back pain </= 0-1/10 due to improve mobility    Baseline back pain from bending over to vomit due to pregnancy    Time 12    Period Weeks    Status Not Met      PT LONG TERM GOAL #4   Title able to have a bowel movement without straining due to reduction of pelvic floor tightness and elongation with breath    Time 12    Period Weeks    Status Achieved      PT LONG TERM GOAL #5   Title understand different ways to reduce her back and abdominal pain as her pregnancy progresses    Time 12    Period Weeks    Status Achieved      PT LONG TERM GOAL #6  Title understand different ways to give birth to reduce strain on back and abdomen    Time 12    Period Weeks    Status Deferred                   Plan - 07/30/20 1318     Clinical Impression Statement Patient is doing well. She just has back pain due to leaning over to vomit and feeling nauseating. Patient has a home program for back stretches and ways to massage her back at home. Patient tried to do quadruped  lift the left arm but had some back pain in the right quadratus. Patient was not having pain with penile penetration but now is not having intercourse due to not feeling well. Patient is not having to strain to have a bowel movement.  Patient is ready for discharge.    Personal Factors and Comorbidities Comorbidity 2;Sex    Comorbidities right hernia repair; right ovarian cyst removed    Examination-Participation Restrictions Interpersonal Relationship;Community Activity    Stability/Clinical Decision Making Stable/Uncomplicated    Rehab Potential Excellent    PT Treatment/Interventions ADLs/Self Care Home Management;Cryotherapy;Moist Heat;Neuromuscular re-education;Therapeutic exercise;Therapeutic activities;Patient/family education;Manual techniques;Dry needling;Scar mobilization;Spinal Manipulations    PT Next Visit Plan Discharge to HEP    PT Home Exercise Plan Access Code: [email or web address]    Consulted and Agree with Plan of Care Patient             Patient will benefit from skilled therapeutic intervention in order to improve the following deficits and impairments:  Decreased coordination, Decreased range of motion, Increased fascial restricitons, Decreased endurance, Increased muscle spasms, Pain, Decreased activity tolerance, Decreased scar mobility, Decreased strength  Visit Diagnosis: Muscle weakness (generalized)  Other lack of coordination  Right lower quadrant abdominal pain  Acute bilateral low back pain without sciatica     Problem List Patient Active Problem List   Diagnosis Date Noted   Nausea/vomiting in pregnancy 07/29/2020   Sickle cell trait ([place]) 07/29/2020   Alpha thalassemia silent carrier 07/29/2020   Supervision of other normal pregnancy, antepartum 06/30/2020    Earlie Counts, PT 07/30/20 2:04 PM   [facility] [address], [place], [place], [address] Phone: [phone]   Fax:   [phone]  Name: Jacqulynn Shappell MRN: [id number] Date of Birth: 1995-05-30  PHYSICAL THERAPY DISCHARGE SUMMARY  Visits from Start of Care: 6  Current functional level related to goals / functional outcomes: See above.    Remaining deficits: See above.    Education / Equipment: HEP   Patient agrees to discharge. Patient goals were met. Patient is being discharged due to meeting the stated rehab goals.

## 2020-07-31 LAB — AFP, SERUM, OPEN SPINA BIFIDA
AFP MoM: 1.19
AFP Value: 62.4 ng/mL
Gest. Age on Collection Date: 16.3 weeks
Maternal Age At EDD: 26.3 yr
OSBR Risk 1 IN: 10000
Test Results:: NEGATIVE
Weight: 101 [lb_av]

## 2020-08-03 ENCOUNTER — Other Ambulatory Visit: Payer: Self-pay

## 2020-08-03 ENCOUNTER — Ambulatory Visit: Payer: BC Managed Care – PPO | Attending: Women's Health | Admitting: Genetic Counselor

## 2020-08-03 DIAGNOSIS — Z315 Encounter for genetic counseling: Secondary | ICD-10-CM

## 2020-08-03 DIAGNOSIS — D573 Sickle-cell trait: Secondary | ICD-10-CM | POA: Diagnosis not present

## 2020-08-03 DIAGNOSIS — D563 Thalassemia minor: Secondary | ICD-10-CM | POA: Diagnosis not present

## 2020-08-03 NOTE — Progress Notes (Signed)
08/03/2020  Saira Weatherholtz 15-Sep-1995 MRN: [id number] DOV: 08/03/2020  Ms. Krouse presented to the [facility] for a genetics consultation regarding her carrier status for sickle cell disease and alpha-thalassemia. Ms. Kady was accompanied to her appointment by her husband, Adedeji Forry.   Indication for genetic counseling - Sickle cell trait - Silent alpha-thalassemia carrier  Prenatal history  Ms. Staffa is a G1P0000, 25 y.o. female. Her current pregnancy has completed [redacted]w[redacted]d (Estimated Date of Delivery: 01/10/21).  Ms. Gonsalves denied exposure to environmental toxins or chemical agents. She denied the use of alcohol, tobacco or street drugs. She reported taking prenatal vitamins and Phenergan. She denied significant viral illnesses, fevers, and bleeding during the course of her pregnancy. Her medical and surgical histories were noncontributory.  Family History  A three generation pedigree was drafted and reviewed. Both family histories were reviewed and found to be noncontributory for birth defects, intellectual disability, recurrent pregnancy loss, and known genetic conditions. Ms. Vohs had limited information about her paternal family history; thus, risk assessment was limited.  The patient's ancestry is [place]. The father of the pregnancy's ancestry is [place]. Ashkenazi Jewish ancestry and consanguinity were denied. Pedigree will be scanned under Media.  Discussion  Ms. Landfair was referred for genetic counseling to discuss results from her [employer] carrier screening. Results from carrier screening identified her as a carrier of sickle cell trait and a silent carrier for alpha-thalassemia.  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    We discussed that SCD is one condition in a group of blood  disorders that affect hemoglobin in red blood cells (hemoglobinopathies). Hemoglobin is a protein that transports oxygen from the lungs to organs and tissues throughout the body. Individuals with SCD have an inherited structural abnormality in hemoglobin's beta globin chains due to a single amino acid change in the HBB gene. Instead of producing normal adult hemoglobin (HbA), individuals with SCD produce an atypical form of hemoglobin called hemoglobin S (HbS). Typically, individuals are expected to have two copies of HbA (HbAA). Individuals who are carriers of SCD have one copy of HbA and one copy of HbS (HbAS), whereas individuals affected by SCD have two copies of HbS (HbSS). Carriers of SCD are often said to have sickle cell "trait".   HbS alters the configuration of the hemoglobin molecule. As a result, individuals with SCD have red blood cells that can sickle and obstruct blood flow in small blood vessels, causing ischemia of tissues and organs and episodes of vaso-occlusive crisis. The amino acid change in the HBB gene also causes red blood cells to become fragile and break down easily, which results in chronic anemia. Additional complications associated with SCD may include organ damage, frequent infections, acute chest syndrome, ischemic stroke, splenic sequestration, priapism, and pulmonary hypertension. SCD is inherited in an autosomal recessive pattern, where both parents must carry HbS trait to be at risk of having an affected child. If Ms. Hohn's partner were also a carrier of SCD, the couple would have a 1 in 4 (25%) chance of having a child with SCD.    HbS is just one variant form of hemoglobin caused by a mutations in the HBB gene. It is also possible that Ms. Teem's partner could carry another variant form of hemoglobin, such as hemoglobin C (HbC). He could also be a carrier for  beta-thalassemia. If either of these were the case, there could be different clinical implications for the  couple's children. Given his ethnicity, Ms. Jacober's partner has a 1 in 8 chance of being a carrier for an HBB-related hemoglobinopathy or beta thalassemia   Alpha-thalassemia:  We also discussed that Ms. Corvera was identified to be a silent carrier for alpha-thalassemia (aa/a-) on [employer] carrier screening. Alpha-thalassemia is different in its inheritance compared to other hemoglobinopathies as there are two copies of two alpha globin genes (HBA1 and HBA2) on each chromosome 16, or four alpha globin genes total (aa/aa). A person can be a carrier of one alpha gene mutation (aa/a-), also referred to as a "silent carrier". A person who carries two alpha globin gene mutations can either carry them in cis (both on the same chromosome, denoted as aa/--) or in trans (on different chromosomes, denoted as a-/a-). Alpha-thalassemia carriers of two mutations who have African American ancestry are more likely to have a trans arrangement (a-/a-); cis configuration is reported to be rare in individuals with African American ancestry.     There are several different forms of alpha-thalassemia. The most severe form of alpha-thalassemia, Hb Barts, is associated with an absence of alpha globin chain synthesis as a result of deletions of all four alpha globin genes (--/--).  Given that Ms. Los is a silent carrier (aa/a-), her pregnancies would not be at increased risk for Hb Barts, even if her partner is a carrier for alpha-thalassemia, as she will always pass on at least one copy of the alpha globin gene to her children. Hemoglobin H (HbH) disease is caused by three deleted or dysfunctioning alpha globin alleles (a-/--) and is characterized by microcytic hypochromic hemolytic anemia, hepatosplenomegaly, mild jaundice, growth retardation, and sometimes thalassemia-like bone changes. Given Ms. Menger's silent carrier status (aa/a-), the current fetus would only be at risk for HbH disease (a-/--), if her partner  is a carrier for two alpha globin mutations in cis (aa/--). If this is the case, the risk for HbH disease in the pregnancy would be 1 in 4 (25%). However, if Ms. Godinho's partner is a carrier for two alpha globin mutations, he would be more likely to carry them in trans configuration (a-/a-) than the cis configuration (aa/--), given his ethnicity. If he is a carrier of alpha-thalassemia in trans, then the pregnancy would not be at increased risk for HbH disease. Based on the carrier frequency for alpha-thalassemia in the African American population, Ms. Steinkamp's partner has a 1 in 30 chance of being any type of carrier for alpha-thalassemia. Thus, the couple has a <1% chance of having a baby with alpha-thalassemia.  Other carrier screening results:  Ms. Fontes carrier screening was negative for the other 2 conditions screened. Thus, her risk to be a carrier for these additional conditions (listed separately in the laboratory report) has been reduced but not eliminated. This also significantly reduces her risk of having a child affected by one of these conditions. We discussed that carrier testing for hemoglobinopathies and alpha-thalassemia is recommended for Ms. Augusta's partner. Ms. Mcmullan and her partner indicated that they are interested in pursuing partner carrier screening.  Aneuploidy screening results:  We also reviewed that Ms. Pawling had [facility] noninvasive prenatal screening (NIPS) through the laboratory [facility] that was low-risk for fetal aneuploidies. We reviewed that these results showed a less than 1 in 10,000 risk for trisomies 21, 18 and 13, and monosomy X (Turner syndrome). In addition, the risk for triploidy and  sex chromosome trisomies (47,XXX and 47,XXY) was also low. Ms. Bashor elected to have cfDNA analysis for 22q11.2 deletion syndrome, which was also low risk (1 in 12,000). We reviewed that while this testing identifies 94-99% of pregnancies with trisomy 8, trisomy  48, and trisomy 74, and >70% of cases of sex chromosome aneuploidies, it is NOT diagnostic. A positive test result requires confirmation by CVS or amniocentesis, and a negative test result does not rule out a fetal chromosome abnormality. She also understands that this testing does not identify all genetic conditions.  Diagnostic testing:  Ms. Stehr was also counseled regarding diagnostic testing via amniocentesis. We discussed the technical aspects of the procedure and quoted up to a 1 in 500 (0.2%) risk for spontaneous pregnancy loss or other adverse pregnancy outcomes as a result of amniocentesis. Cultured cells from an amniocentesis sample allow for the visualization of a fetal karyotype, which can detect >99% of large chromosomal aberrations. Chromosomal microarray can also be performed to identify smaller deletions or duplications of fetal chromosomal material. Amniocentesis could also be performed to assess whether the baby is affected by a hemoglobinopathy or alpha-thalassemia. After careful consideration, Ms. Kapler declined amniocentesis at this time. She understands that amniocentesis is available at any point after 16 weeks of pregnancy and that she may opt to undergo the procedure at a later date should she change her mind.  Plan:  Ms. Harling husband had a sample drawn for partner carrier screening today. Carrier screening was ordered for the HBA1, HBA2, and HBB genes through the laboratory [facility] to refine the risks for a hemoglobinopathy or alpha-thalassemia in the current fetus and the couple's future children. Mr. Loncar was informed that the laboratory will perform a benefits investigation to estimate the expected out of pocket cost through of testing through his insurance company. If this is expected to be >$250, Mr. Rawl has the option of paying $250 via [facility]'s self-pay price. The laboratory will contact him with his personalized estimate.  Results will take 2-3 weeks  to be returned. We will call the couple once Mr. Manfredonia's results become available.   I counseled Ms. Germer regarding the above risks and available options. The approximate face-to-face time with the genetic counselor was 40 minutes.  In summary: Discussed carrier screening results and options for follow-up testing Carrier of sickle cell trait Silent carrier for alpha-thalassemia  Husband had sample drawn for partner carrier screening. We will follow results Reviewed low-risk NIPS result Reduction in risk for Down syndrome, trisomy 80, trisomy 37, triploidy, sex chromosome aneuploidies, and 22q11.2 deletion syndrome Offered additional testing and screening Declined amniocentesis Reviewed family history concerns   Gershon Crane, MS, Aeronautical engineer

## 2020-08-13 ENCOUNTER — Telehealth: Payer: Self-pay | Admitting: Obstetrics and Gynecology

## 2020-08-13 NOTE — Telephone Encounter (Signed)
We called and spoke with Crystal Bentley's partner, Crystal Bentley, via telephone regarding his carrier screening results. Because Crystal Bentley is carrier for sickle cell trait and is a silent carrier of alpha thalassemia, we performed carrier testing for both HBB and HBA1/2 genes. I shared with Crystal that he was found to be a silent carrier for alpha thalassemia. This means that he has 3 copies of the HBA gene instead of the 4 we would expect. Because both partners are silent carriers, there is a 1 in 4 chance (25%) that each of their children will be carriers of alpha thalassemia. Carriers have 2 copies of the gene and may experience mild anemia (which may cause fatigue and pain). Crystal Bentley was counseled that their children are not at risk for alpha thalassemia major (0 copies of the gene) or Hemoglobin H disease (1 copy of the gene), which are more severe versions of the condition. Because Crystal Bentley was not found to be a carrier of sickle cell trait, their children are not at risk of sickle cell disease. There is a 1 in 2 chance (50%) that their children will be unaffected carriers. Crystal Bentley asked about iron supplementation for thalassemia-related anemia, and he was counseled that dietary supplementation is not effective in these cases. He was advised to consult his doctor about available anemia management options.     This call was conducted by Crystal Bentley, genetic counseling intern, supervised by Crystal Stack, Ms, CGC.  Crystal Anderson, MS, CGC

## 2020-08-19 ENCOUNTER — Ambulatory Visit: Payer: Self-pay

## 2020-08-24 ENCOUNTER — Ambulatory Visit: Payer: BC Managed Care – PPO | Attending: Obstetrics and Gynecology

## 2020-08-24 ENCOUNTER — Ambulatory Visit: Payer: BC Managed Care – PPO | Admitting: *Deleted

## 2020-08-24 ENCOUNTER — Other Ambulatory Visit: Payer: Self-pay

## 2020-08-24 VITALS — BP 111/64 | HR 86

## 2020-08-24 DIAGNOSIS — Z862 Personal history of diseases of the blood and blood-forming organs and certain disorders involving the immune mechanism: Secondary | ICD-10-CM | POA: Insufficient documentation

## 2020-08-24 DIAGNOSIS — Z363 Encounter for antenatal screening for malformations: Secondary | ICD-10-CM | POA: Diagnosis not present

## 2020-08-24 DIAGNOSIS — Z3480 Encounter for supervision of other normal pregnancy, unspecified trimester: Secondary | ICD-10-CM

## 2020-08-24 DIAGNOSIS — Z3A20 20 weeks gestation of pregnancy: Secondary | ICD-10-CM | POA: Insufficient documentation

## 2020-08-24 DIAGNOSIS — Z148 Genetic carrier of other disease: Secondary | ICD-10-CM | POA: Insufficient documentation

## 2020-08-24 DIAGNOSIS — Z3482 Encounter for supervision of other normal pregnancy, second trimester: Secondary | ICD-10-CM | POA: Diagnosis present

## 2020-08-26 ENCOUNTER — Encounter: Payer: Medicaid Other | Admitting: Women's Health

## 2020-09-02 ENCOUNTER — Ambulatory Visit (INDEPENDENT_AMBULATORY_CARE_PROVIDER_SITE_OTHER): Payer: BC Managed Care – PPO | Admitting: Women's Health

## 2020-09-02 ENCOUNTER — Other Ambulatory Visit: Payer: Self-pay

## 2020-09-02 VITALS — BP 107/74 | HR 94 | Wt 109.6 lb

## 2020-09-02 DIAGNOSIS — R002 Palpitations: Secondary | ICD-10-CM

## 2020-09-02 DIAGNOSIS — D563 Thalassemia minor: Secondary | ICD-10-CM

## 2020-09-02 DIAGNOSIS — Z3480 Encounter for supervision of other normal pregnancy, unspecified trimester: Secondary | ICD-10-CM

## 2020-09-02 DIAGNOSIS — D573 Sickle-cell trait: Secondary | ICD-10-CM

## 2020-09-02 DIAGNOSIS — Z3A21 21 weeks gestation of pregnancy: Secondary | ICD-10-CM | POA: Diagnosis not present

## 2020-09-02 NOTE — Patient Instructions (Signed)
Maternity Assessment Unit (MAU)  The Maternity Assessment Unit (MAU) is located at the [facility] and [place] at [facility]. The address is: [address] [place], [place], [place] [address]. Please see map below for additional directions.    The Maternity Assessment Unit is designed to help you during your pregnancy, and for up to 6 weeks after delivery, with any pregnancy- or postpartum-related emergencies, if you think you are in labor, or if your water has broken. For example, if you experience nausea and vomiting, vaginal bleeding, severe abdominal or pelvic pain, elevated blood pressure or other problems related to your pregnancy or postpartum time, please come to the Maternity Assessment Unit for assistance.       Preterm Labor The normal length of a pregnancy is 39-41 weeks. Preterm labor is when labor starts before 37 completed weeks of pregnancy. Babies who are born prematurely and survive may not be fully developed and may be at an increased risk for long-term problems such as cerebral palsy, developmental delays, and vision andhearing problems. Babies who are born too early may have problems soon after birth. Premature babies may have problems regulating blood sugar, body temperature, heart rate, and breathing rate. These babies often have trouble with feeding. The risk ofhaving problems is highest for babies who are born before 39 weeks of pregnancy. What are the causes? The exact cause of this condition is not known. What increases the risk? You are more likely to have preterm labor if you have certain risk factors that relate to your medical history, problems with present and past pregnancies, andlifestyle factors. Medical history You have abnormalities of the uterus, including a short cervix. You have STIs (sexually transmitted infections) or other infections of the urinary tract and the vagina. You have chronic illnesses, such as blood clotting  problems, diabetes, or high blood pressure. You are overweight or underweight. Present and past pregnancies You have had preterm labor before. You are pregnant with twins or other multiples. You have been diagnosed with a condition in which the placenta covers your cervix (placenta previa). You waited less than 18 months between giving birth and becoming pregnant again. Your unborn baby has some abnormalities. You have vaginal bleeding during pregnancy. You became pregnant through in vitro fertilization (IVF). Lifestyle and environmental factors You use tobacco products or drink alcohol. You use drugs. You have stress and no social support. You experience domestic violence. You are exposed to certain chemicals or environmental pollutants. Other factors You are younger than age 94 or older than age 43. What are the signs or symptoms? Symptoms of this condition include: Cramps similar to those that can happen during a menstrual period. The cramps may happen with diarrhea. Pain in the abdomen or lower back. Regular contractions that may feel like tightening of the abdomen. A feeling of increased pressure in the pelvis. Increased watery or bloody mucus discharge from the vagina. Water breaking (ruptured amniotic sac). How is this diagnosed? This condition is diagnosed based on: Your medical history and a physical exam. A pelvic exam. An ultrasound. Monitoring your uterus for contractions. Other tests, including: A swab of the cervix to check for a chemical called fetal fibronectin. Urine tests. How is this treated? Treatment for this condition depends on the length of your pregnancy, your condition, and the health of your baby. Treatment may include: Taking medicines, such as: Hormone medicines. These may be given early in pregnancy to help support the pregnancy. Medicines to stop contractions. Medicines to  help mature the baby's lungs. These may be prescribed if the risk of  delivery is high. Medicines to help protect your baby from brain and nerve complications such as cerebral palsy. Bed rest. If the labor happens before 34 weeks of pregnancy, you may need to stay in the hospital. Delivery of the baby. Follow these instructions at home:  Do not use any products that contain nicotine or tobacco. These products include cigarettes, chewing tobacco, and vaping devices, such as e-cigarettes. If you need help quitting, ask your health care provider. Do not drink alcohol. Take over-the-counter and prescription medicines only as told by your health care provider. Rest as told by your health care provider. Return to your normal activities as told by your health care provider. Ask your health care provider what activities are safe for you. Keep all follow-up visits. This is important. How is this prevented? To increase your chance of having a full-term pregnancy: Do not use drugs or take medicines that have not been prescribed to you during your pregnancy. Talk with your health care provider before taking any herbal supplements, even if you have been taking them regularly. Make sure you gain a healthy amount of weight during your pregnancy. Watch for infection. If you think that you might have an infection, get it checked right away. Symptoms of infection may include: Fever. Abnormal vaginal discharge or discharge that smells bad. Pain or burning with urination. Needing to urinate urgently. Frequently urinating or passing small amounts of urine frequently. Blood in your urine or urine that smells bad or unusual. Where to find more information U.S. Department of Health and Cytogeneticist on Women's Health: http://hoffman.com/ [employer] of Obstetricians and Gynecologists: www.acog.org Centers for Disease Control and Prevention, Preterm Birth: FootballExhibition.com.br Contact a health care provider if: You think you are going into preterm labor. You have signs  or symptoms of preterm labor. You have symptoms of infection. Get help right away if: You are having regular, painful contractions every 5 minutes or less. Your water breaks. Summary Preterm labor is labor that starts before you reach 37 weeks of pregnancy. Delivering your baby early increases your baby's risk of developing long-term problems. You are more likely to have preterm labor if you have certain risk factors that relate to your medical history, problems with present and past pregnancies, and lifestyle factors. Keep all follow-up visits. This is important. Contact a health care provider if you have signs or symptoms of preterm labor. This information is not intended to replace advice given to you by your health care provider. Make sure you discuss any questions you have with your healthcare provider. Document Revised: 01/28/2020 Document Reviewed: 01/28/2020 Elsevier Patient Education  2022 Elsevier Inc.       AREA PEDIATRIC/FAMILY PRACTICE PHYSICIANS  [facility] [address] [place], [place] [address] Phone - [phone]   Fax - [phone]  JACK AMOS [address] [place], [place]  [address] Phone - [phone]   Fax - [phone]  [facility] [address] [place], [place]  [address] Phone - [phone]   Fax - [phone]  [facility] [address] [place], [place]  [address] Phone - [phone]   Fax - [phone]  [facility] [address] [place], [place]  [address] Phone - [phone]   Fax - [phone]  [facility] [address] [place], [place]  [address] Phone - [phone]  Fax - [phone]  [facility] [address] [place], [place]  [address] Phone - [phone]   Fax - [phone]  [facility] AT [facility] [address] [place], [address] [place], [place]   [address] Phone - [phone]   Fax - [phone]  [facility] AT [facility] [address] [place], [place]  [address] Phone - [phone]   Fax - [phone] [facility] AT [place] [address] [place], [place]  [address] Phone - [phone]   Fax - [phone]  [facility] [address] [place], [place]  [address] Phone - [phone]   Fax - [phone]  [facility] [address] [place], [place]  [address] Phone - [phone]   Fax - [phone]  [facility] [place] [address] [place], [place]  [address] Phone - [phone]   Fax - [phone]  [facility] [address], [address] [place], [place]  [address] Phone - [phone]  [facility] [address] [place], [place]  [address] Phone - [phone]   Fax - [phone]  [facility] [address] [place], [place]  [address] Phone - [phone]   Fax - [phone]  [facility] [address] [place], [place]  [address] Phone - [phone]   Fax - [phone]  [facility] [address] [place], [place]  [address] Phone - [phone]   Fax - [phone]   [facility] [address] [place], [place]  [address] Phone - [phone]   Fax - [phone]  [facility] [address] [place], [place]  [address] Phone - [phone]   Fax - [phone]  DAVID RUBIN [address] [place], [place]  [address] Phone - [phone]   Fax - [phone]  [facility] [address] [place], [place]  [address] Phone - [phone]   Fax - [phone]  [facility] Alita Chyle [address] [place], [place]  [address] Phone - [phone]   Fax - [phone] Gerarda Fraction [address] [place], [place]   [address] Phone - [phone]   Fax - [phone]  [facility] [address] [place], [place]  [address] Phone - [phone]   Fax - [phone]  [facility] [place] [address] [place], [place]  [address] Phone - [phone]   Fax - [phone]        Childbirth Education Options: [facility] Department Classes:  Childbirth education classes can help you get ready for a positive parenting experience. You can also meet other expectant parents and get free stuff for your baby. Each class runs for five weeks on the same night and costs $45 for the mother-to-be and her support person. Medicaid covers the cost if you are eligible. Call [phone] to register. [facility] Childbirth Education:  Register at [email or web address]   There are fees associated with some of these classes. Check website for most up-to-date information.   Baby & Me Class: Discuss newborn & infant parenting and family adjustment issues with other new mothers in a relaxed environment. Each week brings a new speaker or baby-centered activity. We encourage new mothers to join [place] every Thursday at 11:00am. Babies birth until crawling. No registration or fee. Daddy [employer]: This course offers Dads-to-be the tools and knowledge needed  to feel confident on their journey to becoming new fathers. Experienced dads, who have been trained as coaches, teach dads-to-be how to hold, comfort, diaper, swaddle and play with their infant while being able to support the new mom as well. A class for men taught by men.  Big Brother/Big Sister: Let your children share in the joy of a new brother or sister in this special class designed just for them. Class includes discussion about how families care for babies: swaddling, holding, diapering, safety as well as how they can be helpful in their new role. This class is designed for children ages 2 to 46, but any age is  welcome. Please register each child individually.  Mom Talk: This mom-led group offers support and connection to mothers as they journey through the adjustments and struggles of that sometimes overwhelming first year after the birth of a child. Tuesdays at 10:00am and Thursdays at 6:00pm. Babies welcome. No registration or fee. Breastfeeding Support Group: This group is a mother-to-mother support circle where moms have the opportunity to share their breastfeeding experiences. A Lactation Consultant is present for questions and concerns. Meets each Tuesday at 11:00am. No fee or registration. Breastfeeding Your Baby: Learn what to expect in the first days of breastfeeding your newborn.  This class will help you feel more confident with the skills needed to begin your breastfeeding experience. Many new mothers are concerned about breastfeeding after leaving the hospital. This class will also address the most common fears and challenges about breastfeeding during the first few weeks, months and beyond. (call for fee) Comfort Techniques and Tour: This 2 hour interactive class will provide you the opportunity to learn & practice hands-on techniques that can help relieve some of the discomfort of labor and encourage your baby to rotate toward the best position for birth. You and your partner will be able to try a variety of labor positions with birth balls and rebozos as well as practice breathing, relaxation, and visualization techniques. A tour of the [facility] is included with this class.  Childbirth Class- Weekend Option: This class is a Weekend version of our Birth & Baby series. It is designed for parents who have a difficult time fitting several weeks of classes into their schedule. It covers the care of your newborn and the basics of labor and childbirth. It also includes a Maternity Care Center Tour of [facility] and lunch. The class is held two consecutive days: beginning  on Friday evening from 6:30 - 8:30 p.m. and the next day, Saturday from 9 a.m. - 4 p.m. (call for fee) Linden Dolin Class: Interested in a waterbirth?  This informational class will help you discover whether waterbirth is the right fit for you. Education about waterbirth itself, supplies you would need and how to assemble your support team is what you can expect from this class. Some obstetrical practices require this class in order to pursue a waterbirth. (Not all obstetrical practices offer waterbirth-check with your healthcare provider.) Register only the expectant mom, but you are encouraged to bring your partner to class! Required if planning waterbirth, no fee. Infant/Child CPR: Parents, grandparents, babysitters, and friends learn Cardio-Pulmonary Resuscitation skills for infants and children. You will also learn how to treat both conscious and unconscious choking in infants and children. This Family & Friends program does not offer certification. Register each participant individually to ensure that enough mannequins are available. (Call for fee) Grandparent Love: Expecting a grandbaby? This class is for you! Learn  about the latest infant care and safety recommendations and ways to support your own child as he or she transitions into the parenting role. Taught by Registered Nurses who are childbirth instructors, but most importantly...they are grandmothers too! Childbirth Class- Natural Childbirth: This series of 5 weekly classes is for expectant parents who want to learn and practice natural methods of coping with the process of labor and childbirth. Relaxation, breathing, massage, visualization, role of the partner, and helpful positioning are highlighted. Participants learn how to be confident in their body's ability to give birth. This class will empower and help parents make informed decisions about their own care. Includes discussion that will help new parents transition into the immediate postpartum  period. Maternity Care Center Tour of [facility] is included. We suggest taking this class between 25-32 weeks, but it's only a recommendation. Childbirth Class- 3 week Series: This option of 3 weekly classes helps you and your labor partner prepare for childbirth. Newborn care, labor & birth, cesarean birth, pain management, and comfort techniques are discussed and a Maternity Care Center Tour of [facility] is included. The class meets at the same time, on the same day of the week for 3 consecutive weeks beginning with the starting date you choose.  Marvelous Multiples: Expecting twins, triplets, or more? This class covers the differences in labor, birth, parenting, and breastfeeding issues that face multiples' parents. NICU tour is included. Led by a Certified Childbirth Educator who is the mother of twins. No fee. Caring for Baby: This class is for expectant and adoptive parents who want to learn and practice the most up-to-date newborn care for their babies. Focus is on birth through the first six weeks of life. Topics include feeding, bathing, diapering, crying, umbilical cord care, circumcision care and safe sleep. Parents learn to recognize symptoms of illness and when to call the pediatrician. Register only the mom-to-be and your partner or support person can plan to come with you!  Childbirth Class- online option: This online class offers you the freedom to complete a Birth and Baby series in the comfort of your own home. The flexibility of this option allows you to review sections at your own pace, at times convenient to you and your support people. It includes additional video information, animations, quizzes, and extended activities. Get organized with helpful eClass tools, checklists, and trackers. Once you register online for the class, you will receive an email within a few days to accept the invitation and begin the class when the time is right for you. The content will be available  to you for 60 days.

## 2020-09-02 NOTE — Progress Notes (Signed)
Subjective:  Crystal Bentley is a 25 y.o. G1P0000 at [redacted]w[redacted]d being seen today for ongoing prenatal care.  She is currently monitored for the following issues for this low-risk pregnancy and has Supervision of other normal pregnancy, antepartum; Nausea/vomiting in pregnancy; Sickle cell trait (HCC); and Alpha thalassemia silent carrier on their problem list.  Patient reports no complaints.  Contractions: Not present. Vag. Bleeding: None.  Movement: Present. Denies leaking of fluid.   The following portions of the patient's history were reviewed and updated as appropriate: allergies, current medications, past family history, past medical history, past social history, past surgical history and problem list. Problem list updated.  Objective:   Vitals:   09/02/20 1614  BP: 107/74  Pulse: 94  Weight: 109 lb 9.6 oz (49.7 kg)    Fetal Status: Fetal Heart Rate (bpm): 149   Movement: Present     General:  Alert, oriented and cooperative. Patient is in no acute distress.  Skin: Skin is warm and dry. No rash noted.   Cardiovascular: Normal heart rate noted, S1/S2 heard, no abnormal heart sounds auscultated  Respiratory: Normal respiratory effort, no problems with respiration noted  Abdomen: Soft, gravid, appropriate for gestational age. Pain/Pressure: Absent     Pelvic: Vag. Bleeding: None     Cervical exam deferred        Extremities: Normal range of motion.  Edema: None  Mental Status: Normal mood and affect. Normal behavior. Normal judgment and thought content.   Urinalysis:      Assessment and Plan:  Pregnancy: G1P0000 at [redacted]w[redacted]d  1. Palpitations - AMB Referral to Cardio Obstetrics  2. Supervision of other normal pregnancy, antepartum -peds list given -CBE info given  3. Alpha thalassemia silent carrier -GC 08/03/2020  4. Sickle cell trait (HCC) -GC 08/03/2020  5. [redacted] weeks gestation of pregnancy  Preterm labor symptoms and general obstetric precautions including but not limited  to vaginal bleeding, contractions, leaking of fluid and fetal movement were reviewed in detail with the patient. I discussed the assessment and treatment plan with the patient. The patient was provided an opportunity to ask questions and all were answered. The patient agreed with the plan and demonstrated an understanding of the instructions. The patient was advised to call back or seek an in-person office evaluation/go to [facility] at [facility] for any urgent or concerning symptoms. Please refer to After Visit Summary for other counseling recommendations.  Return in about 4 weeks (around 09/30/2020) for in-person LOB/APP OK.   Vanecia Limpert, [name], NP

## 2020-09-02 NOTE — Progress Notes (Signed)
Pt reports fetal movement, denies pain. Pt states that at times she feels her heart beating really fast and she has to sit down to rest.

## 2020-09-05 ENCOUNTER — Other Ambulatory Visit: Payer: Self-pay | Admitting: Obstetrics

## 2020-09-05 DIAGNOSIS — O219 Vomiting of pregnancy, unspecified: Secondary | ICD-10-CM

## 2020-09-18 ENCOUNTER — Other Ambulatory Visit: Payer: Self-pay | Admitting: Obstetrics

## 2020-09-18 DIAGNOSIS — O219 Vomiting of pregnancy, unspecified: Secondary | ICD-10-CM

## 2020-09-22 ENCOUNTER — Other Ambulatory Visit: Payer: Self-pay

## 2020-09-22 DIAGNOSIS — O219 Vomiting of pregnancy, unspecified: Secondary | ICD-10-CM

## 2020-09-22 MED ORDER — PROMETHAZINE HCL 25 MG RE SUPP: 25.0000 mg | Freq: Four times a day (QID) | RECTAL | 2 refills | Status: DC | PRN

## 2020-09-22 NOTE — Progress Notes (Signed)
Called pharmacy pt has no refills and was only given 6 phenergan suppositories Rx refilled.

## 2020-09-26 ENCOUNTER — Other Ambulatory Visit: Payer: Self-pay

## 2020-09-26 ENCOUNTER — Inpatient Hospital Stay (HOSPITAL_COMMUNITY)
Admission: AD | Admit: 2020-09-26 | Discharge: 2020-09-26 | Disposition: A | Discharge status: Home / Self Care | Payer: BC Managed Care – PPO | Attending: Obstetrics and Gynecology | Admitting: Obstetrics and Gynecology

## 2020-09-26 ENCOUNTER — Encounter (HOSPITAL_COMMUNITY): Payer: Self-pay | Admitting: Obstetrics and Gynecology

## 2020-09-26 DIAGNOSIS — O212 Late vomiting of pregnancy: Secondary | ICD-10-CM | POA: Insufficient documentation

## 2020-09-26 DIAGNOSIS — O26892 Other specified pregnancy related conditions, second trimester: Secondary | ICD-10-CM | POA: Diagnosis present

## 2020-09-26 DIAGNOSIS — R12 Heartburn: Secondary | ICD-10-CM

## 2020-09-26 DIAGNOSIS — O219 Vomiting of pregnancy, unspecified: Secondary | ICD-10-CM

## 2020-09-26 DIAGNOSIS — R109 Unspecified abdominal pain: Secondary | ICD-10-CM | POA: Insufficient documentation

## 2020-09-26 DIAGNOSIS — Z3A24 24 weeks gestation of pregnancy: Secondary | ICD-10-CM | POA: Diagnosis not present

## 2020-09-26 LAB — URINALYSIS, ROUTINE W REFLEX MICROSCOPIC
Bilirubin Urine: NEGATIVE
Glucose, UA: NEGATIVE mg/dL
Hgb urine dipstick: NEGATIVE
Ketones, ur: 80 mg/dL — AB
Leukocytes,Ua: NEGATIVE
Nitrite: NEGATIVE
Protein, ur: NEGATIVE mg/dL
Specific Gravity, Urine: 1.015 (ref 1.005–1.030)
pH: 6 (ref 5.0–8.0)

## 2020-09-26 MED ORDER — ONDANSETRON 8 MG PO TBDP: 8.0000 mg | ORAL_TABLET | Freq: Three times a day (TID) | ORAL | 2 refills | Status: DC | PRN

## 2020-09-26 MED ORDER — ALUM & MAG HYDROXIDE-SIMETH 200-200-20 MG/5ML PO SUSP
30.0000 mL | Freq: Once | ORAL | Status: AC
  Administered 2020-09-26: 30 mL via ORAL
  Filled 2020-09-26: qty 30

## 2020-09-26 MED ORDER — FAMOTIDINE 20 MG PO TABS: 20.0000 mg | ORAL_TABLET | Freq: Two times a day (BID) | ORAL | 0 refills | Status: DC

## 2020-09-26 MED ORDER — LIDOCAINE VISCOUS HCL 2 % MT SOLN
15.0000 mL | Freq: Once | OROMUCOSAL | Status: AC
  Administered 2020-09-26: 15 mL via ORAL
  Filled 2020-09-26: qty 15

## 2020-09-26 NOTE — Discharge Instructions (Signed)

## 2020-09-26 NOTE — MAU Provider Note (Signed)
History     CSN: [id number]  Arrival date and time: 09/26/20 2229   Event Date/Time   First Provider Initiated Contact with Patient 09/26/20 0820      Chief Complaint  Patient presents with   Abdominal Pain   HPI Crystal Bentley is a 25 y.o. G1P0000 at [redacted]w[redacted]d who presents with intermittent abdominal pain x1 month. She reports the pain is burning and comes and goes. She is feeling it now and rates it a 6/10. She states it is worse after she vomits. She denies any leaking or bleeding. Denies any contractions or abdominal tightening. She reports normal fetal movement.   OB History     Gravida  1   Para  0   Term  0   Preterm  0   AB  0   Living  0      SAB  0   IAB  0   Ectopic  0   Multiple  0   Live Births  0           Past Medical History:  Diagnosis Date   Medical history non-contributory     Past Surgical History:  Procedure Laterality Date   INGUINAL HERNIA REPAIR     4 yrs    OVARIAN CYST REMOVAL     6 yrs     Family History  Problem Relation Age of Onset   Healthy Mother    Stroke Father     Social History   Tobacco Use   Smoking status: Never   Smokeless tobacco: Never  Vaping Use   Vaping Use: Never used  Substance Use Topics   Alcohol use: Never   Drug use: Never    Allergies: No Known Allergies  Medications Prior to Admission  Medication Sig Dispense Refill Last Dose   Doxylamine-Pyridoxine (DICLEGIS) 10-10 MG TBEC Take 2 tablets by mouth at bedtime. If symptoms persist, add one tablet in the morning and one in the afternoon (Patient not taking: Reported on 08/24/2020) 100 tablet 5    Prenatal MV & Min w/FA-DHA (PRENATAL GUMMIES PO) Take by mouth.      promethazine (PHENERGAN) 25 MG suppository Place 1 suppository (25 mg total) rectally every 6 (six) hours as needed for nausea or vomiting. 12 suppository 2     Review of Systems  Constitutional: Negative.  Negative for fatigue and fever.  HENT: Negative.     Respiratory: Negative.  Negative for shortness of breath.   Cardiovascular: Negative.  Negative for chest pain.  Gastrointestinal:  Positive for abdominal pain. Negative for constipation, diarrhea, nausea and vomiting.  Genitourinary: Negative.  Negative for dysuria.  Neurological: Negative.  Negative for dizziness and headaches.  Physical Exam   Blood pressure 115/83, pulse 99, temperature 98 F (36.7 C), temperature source Oral, resp. rate 16, weight 51.9 kg, last menstrual period 04/09/2020, SpO2 100 %.  Physical Exam Vitals and nursing note reviewed.  Constitutional:      General: She is not in acute distress.    Appearance: She is well-developed.  HENT:     Head: Normocephalic.  Eyes:     Pupils: Pupils are equal, round, and reactive to light.  Cardiovascular:     Rate and Rhythm: Normal rate and regular rhythm.     Heart sounds: Normal heart sounds.  Pulmonary:     Effort: Pulmonary effort is normal. No respiratory distress.     Breath sounds: Normal breath sounds.  Abdominal:     General: Bowel sounds  are normal. There is no distension.     Palpations: Abdomen is soft.     Tenderness: There is no abdominal tenderness.  Skin:    General: Skin is warm and dry.  Neurological:     Mental Status: She is alert and oriented to person, place, and time.  Psychiatric:        Mood and Affect: Mood normal.        Behavior: Behavior normal.        Thought Content: Thought content normal.        Judgment: Judgment normal.   Fetal Tracing:  Baseline: 145 Variability: moderate Accels: none Decels: none  Toco: none   MAU Course  Procedures Results for orders placed or performed during the hospital encounter of 09/26/20 (from the past 24 hour(s))  Urinalysis, Routine w reflex microscopic Urine, Clean Catch     Status: Abnormal   Collection Time: 09/26/20  8:43 AM  Result Value Ref Range   Color, Urine YELLOW YELLOW   APPearance HAZY (A) CLEAR   Specific Gravity, Urine  1.015 1.005 - 1.030   pH 6.0 5.0 - 8.0   Glucose, UA NEGATIVE NEGATIVE mg/dL   Hgb urine dipstick NEGATIVE NEGATIVE   Bilirubin Urine NEGATIVE NEGATIVE   Ketones, ur 80 (A) NEGATIVE mg/dL   Protein, ur NEGATIVE NEGATIVE mg/dL   Nitrite NEGATIVE NEGATIVE   Leukocytes,Ua NEGATIVE NEGATIVE    MDM UA GI Cocktail- patient reports complete resolution of pain and requesting discharge home.  NST reassuring for gestational age  Assessment and Plan   1. Heartburn during pregnancy in second trimester   2. [redacted] weeks gestation of pregnancy   3. Nausea/vomiting in pregnancy    -Discharge home in stable condition -Rx for zofran and pepcid sent to patient's pharmacy -Second trimester precautions discussed -Patient advised to follow-up with OB as scheduled for prenatal care. -Patient may return to MAU as needed or if her condition were to change or worsen   Rolm Bookbinder CNM 09/26/2020, 8:21 AM

## 2020-09-26 NOTE — MAU Note (Signed)
Crystal Bentley is a 25 y.o. at [redacted]w[redacted]d here in MAU reporting: upper abdominal pain since after her last OB appointment. No vaginal bleeding or LOF. +FM  Onset of complaint: ongoing  Pain score: 7/10  Vitals:   09/26/20 0751  BP: 115/83  Pulse: 99  Resp: 16  Temp: 98 F (36.7 C)  SpO2: 100%     FHT:150  Lab orders placed from triage: UA

## 2020-09-30 ENCOUNTER — Other Ambulatory Visit: Payer: Self-pay

## 2020-09-30 ENCOUNTER — Ambulatory Visit (INDEPENDENT_AMBULATORY_CARE_PROVIDER_SITE_OTHER): Payer: BC Managed Care – PPO | Admitting: Women's Health

## 2020-09-30 VITALS — BP 105/73 | HR 88 | Wt 118.6 lb

## 2020-09-30 DIAGNOSIS — Z3480 Encounter for supervision of other normal pregnancy, unspecified trimester: Secondary | ICD-10-CM

## 2020-09-30 DIAGNOSIS — M549 Dorsalgia, unspecified: Secondary | ICD-10-CM

## 2020-09-30 DIAGNOSIS — Z3A25 25 weeks gestation of pregnancy: Secondary | ICD-10-CM

## 2020-09-30 DIAGNOSIS — Z789 Other specified health status: Secondary | ICD-10-CM

## 2020-09-30 DIAGNOSIS — O99891 Other specified diseases and conditions complicating pregnancy: Secondary | ICD-10-CM

## 2020-09-30 NOTE — Progress Notes (Signed)
Pt reports fetal movement with some back and leg pain.

## 2020-09-30 NOTE — Progress Notes (Addendum)
Subjective:  Crystal Bentley is a 25 y.o. G1P0000 at [redacted]w[redacted]d being seen today for ongoing prenatal care.  She is currently monitored for the following issues for this low-risk pregnancy and has Supervision of other normal pregnancy, antepartum; Nausea/vomiting in pregnancy; Sickle cell trait (HCC); Alpha thalassemia silent carrier; and No blood products on their problem list.  Patient reports no complaints.  Contractions: Not present. Vag. Bleeding: None.  Movement: Present. Denies leaking of fluid.   The following portions of the patient's history were reviewed and updated as appropriate: allergies, current medications, past family history, past medical history, past social history, past surgical history and problem list. Problem list updated.  Objective:   Vitals:   09/30/20 1449  BP: 105/73  Pulse: 88  Weight: 118 lb 9.6 oz (53.8 kg)    Fetal Status: Fetal Heart Rate (bpm): 160   Movement: Present     General:  Alert, oriented and cooperative. Patient is in no acute distress.  Skin: Skin is warm and dry. No rash noted.   Cardiovascular: Normal heart rate noted  Respiratory: Normal respiratory effort, no problems with respiration noted  Abdomen: Soft, gravid, appropriate for gestational age. Pain/Pressure: Present     Pelvic: Vag. Bleeding: None     Cervical exam deferred        Extremities: Normal range of motion.  Edema: None  Mental Status: Normal mood and affect. Normal behavior. Normal judgment and thought content.   Urinalysis:      Assessment and Plan:  Pregnancy: G1P0000 at [redacted]w[redacted]d  1. Supervision of other normal pregnancy, antepartum -GTT next visit -discussed contraception, info given -referral to PT for back pain, comfort measures and warning signs requiring emergent attention discussed, pt declines RX for Flexeril  2. [redacted] weeks gestation of pregnancy  Preterm labor symptoms and general obstetric precautions including but not limited to vaginal bleeding,  contractions, leaking of fluid and fetal movement were reviewed in detail with the patient. I discussed the assessment and treatment plan with the patient. The patient was provided an opportunity to ask questions and all were answered. The patient agreed with the plan and demonstrated an understanding of the instructions. The patient was advised to call back or seek an in-person office evaluation/go to MAU at [facility] for any urgent or concerning symptoms. Please refer to After Visit Summary for other counseling recommendations.  Return in about 2 weeks (around 10/14/2020) for in-person LOB/APp OK/GTT/labs.   Bayley Hurn, [name], NP

## 2020-09-30 NOTE — Patient Instructions (Signed)
Maternity Assessment Unit (MAU)  The Maternity Assessment Unit (MAU) is located at the [facility] and [place] at [facility]. The address is: [address] [place], [place], [place] [address]. Please see map below for additional directions.    The Maternity Assessment Unit is designed to help you during your pregnancy, and for up to 6 weeks after delivery, with any pregnancy- or postpartum-related emergencies, if you think you are in labor, or if your water has broken. For example, if you experience nausea and vomiting, vaginal bleeding, severe abdominal or pelvic pain, elevated blood pressure or other problems related to your pregnancy or postpartum time, please come to the Maternity Assessment Unit for assistance.       Preterm Labor The normal length of a pregnancy is 39-41 weeks. Preterm labor is when labor starts before 37 completed weeks of pregnancy. Babies who are born prematurely and survive may not be fully developed and may be at an increased risk for long-term problems such as cerebral palsy, developmental delays, and vision andhearing problems. Babies who are born too early may have problems soon after birth. Premature babies may have problems regulating blood sugar, body temperature, heart rate, and breathing rate. These babies often have trouble with feeding. The risk ofhaving problems is highest for babies who are born before 39 weeks of pregnancy. What are the causes? The exact cause of this condition is not known. What increases the risk? You are more likely to have preterm labor if you have certain risk factors that relate to your medical history, problems with present and past pregnancies, andlifestyle factors. Medical history You have abnormalities of the uterus, including a short cervix. You have STIs (sexually transmitted infections) or other infections of the urinary tract and the vagina. You have chronic illnesses, such as blood clotting  problems, diabetes, or high blood pressure. You are overweight or underweight. Present and past pregnancies You have had preterm labor before. You are pregnant with twins or other multiples. You have been diagnosed with a condition in which the placenta covers your cervix (placenta previa). You waited less than 18 months between giving birth and becoming pregnant again. Your unborn baby has some abnormalities. You have vaginal bleeding during pregnancy. You became pregnant through in vitro fertilization (IVF). Lifestyle and environmental factors You use tobacco products or drink alcohol. You use drugs. You have stress and no social support. You experience domestic violence. You are exposed to certain chemicals or environmental pollutants. Other factors You are younger than age 94 or older than age 43. What are the signs or symptoms? Symptoms of this condition include: Cramps similar to those that can happen during a menstrual period. The cramps may happen with diarrhea. Pain in the abdomen or lower back. Regular contractions that may feel like tightening of the abdomen. A feeling of increased pressure in the pelvis. Increased watery or bloody mucus discharge from the vagina. Water breaking (ruptured amniotic sac). How is this diagnosed? This condition is diagnosed based on: Your medical history and a physical exam. A pelvic exam. An ultrasound. Monitoring your uterus for contractions. Other tests, including: A swab of the cervix to check for a chemical called fetal fibronectin. Urine tests. How is this treated? Treatment for this condition depends on the length of your pregnancy, your condition, and the health of your baby. Treatment may include: Taking medicines, such as: Hormone medicines. These may be given early in pregnancy to help support the pregnancy. Medicines to stop contractions. Medicines to  help mature the baby's lungs. These may be prescribed if the risk of  delivery is high. Medicines to help protect your baby from brain and nerve complications such as cerebral palsy. Bed rest. If the labor happens before 34 weeks of pregnancy, you may need to stay in the hospital. Delivery of the baby. Follow these instructions at home:  Do not use any products that contain nicotine or tobacco. These products include cigarettes, chewing tobacco, and vaping devices, such as e-cigarettes. If you need help quitting, ask your health care provider. Do not drink alcohol. Take over-the-counter and prescription medicines only as told by your health care provider. Rest as told by your health care provider. Return to your normal activities as told by your health care provider. Ask your health care provider what activities are safe for you. Keep all follow-up visits. This is important. How is this prevented? To increase your chance of having a full-term pregnancy: Do not use drugs or take medicines that have not been prescribed to you during your pregnancy. Talk with your health care provider before taking any herbal supplements, even if you have been taking them regularly. Make sure you gain a healthy amount of weight during your pregnancy. Watch for infection. If you think that you might have an infection, get it checked right away. Symptoms of infection may include: Fever. Abnormal vaginal discharge or discharge that smells bad. Pain or burning with urination. Needing to urinate urgently. Frequently urinating or passing small amounts of urine frequently. Blood in your urine or urine that smells bad or unusual. Where to find more information U.S. Department of Health and Cytogeneticist on Women's Health: http://hoffman.com/ [employer]: www.acog.org Centers for Disease Control and Prevention, Preterm Birth: FootballExhibition.com.br Contact a health care provider if: You think you are going into preterm labor. You have signs  or symptoms of preterm labor. You have symptoms of infection. Get help right away if: You are having regular, painful contractions every 5 minutes or less. Your water breaks. Summary Preterm labor is labor that starts before you reach 37 weeks of pregnancy. Delivering your baby early increases your baby's risk of developing long-term problems. You are more likely to have preterm labor if you have certain risk factors that relate to your medical history, problems with present and past pregnancies, and lifestyle factors. Keep all follow-up visits. This is important. Contact a health care provider if you have signs or symptoms of preterm labor. This information is not intended to replace advice given to you by your health care provider. Make sure you discuss any questions you have with your healthcare provider. Document Revised: 01/28/2020 Document Reviewed: 01/28/2020 Elsevier Patient Education  2022 Elsevier Inc.       Back Pain in Pregnancy Back pain during pregnancy is common. Back pain may be caused by severalfactors that are related to changes during your pregnancy. Follow these instructions at home: Managing pain, stiffness, and swelling     If directed, for sudden (acute) back pain, put ice on the painful area. Put ice in a plastic bag. Place a towel between your skin and the bag. Leave the ice on for 20 minutes, 2-3 times per day. If directed, apply heat to the affected area before you exercise. Use the heat source that your health care provider recommends, such as a moist heat pack or a heating pad. Place a towel between your skin and the heat source. Leave the heat on for 20-30 minutes. Remove  the heat if your skin turns bright red. This is especially important if you are unable to feel pain, heat, or cold. You may have a greater risk of getting burned. If directed, massage the affected area. Activity Exercise as told by your health care provider. Gentle exercise is the  best way to prevent or manage back pain. Listen to your body when lifting. If lifting hurts, ask for help or bend your knees. This uses your leg muscles instead of your back muscles. Squat down when picking up something from the floor. Do not bend over. Only use bed rest for short periods as told by your health care provider. Bed rest should only be used for the most severe episodes of back pain. Standing, sitting, and lying down Do not stand in one place for long periods of time. Use good posture when sitting. Make sure your head rests over your shoulders and is not hanging forward. Use a pillow on your lower back if necessary. Try sleeping on your side, preferably the left side, with a pregnancy support pillow or 1-2 regular pillows between your legs. If you have back pain after a night's rest, your bed may be too soft. A firm mattress may provide more support for your back during pregnancy. General instructions Do not wear high heels. Eat a healthy diet. Try to gain weight within your health care provider's recommendations. Use a maternity girdle, elastic sling, or back brace as told by your health care provider. Take over-the-counter and prescription medicines only as told by your health care provider. Work with a physical therapist or massage therapist to find ways to manage back pain. Acupuncture or massage therapy may be helpful. Keep all follow-up visits as told by your health care provider. This is important. Contact a health care provider if: Your back pain interferes with your daily activities. You have increasing pain in other parts of your body. Get help right away if: You develop numbness, tingling, weakness, or problems with the use of your arms or legs. You develop severe back pain that is not controlled with medicine. You have a change in bowel or bladder control. You develop shortness of breath, dizziness, or you faint. You develop nausea, vomiting, or sweating. You have  back pain that is a rhythmic, cramping pain similar to labor pains. Labor pain is usually 1-2 minutes apart, lasts for about 1 minute, and involves a bearing down feeling or pressure in your pelvis. You have back pain and your water breaks or you have vaginal bleeding. You have back pain or numbness that travels down your leg. Your back pain developed after you fell. You develop pain on one side of your back. You see blood in your urine. You develop skin blisters in the area of your back pain. Summary Back pain may be caused by several factors that are related to changes during your pregnancy. Follow instructions as told by your health care provider for managing pain, stiffness, and swelling. Exercise as told by your health care provider. Gentle exercise is the best way to prevent or manage back pain. Take over-the-counter and prescription medicines only as told by your health care provider. Keep all follow-up visits as told by your health care provider. This is important. This information is not intended to replace advice given to you by your health care provider. Make sure you discuss any questions you have with your healthcare provider. Document Revised: 01/10/2020 Document Reviewed: 07/12/2017 Elsevier Patient Education  2022 [employer]  [email or web address]">  Third Trimester of Pregnancy  The third trimester of pregnancy is from week 28 through week 40. This is months 7 through 9. The third trimester is a time when the unborn baby (fetus) is growing rapidly. At the end of the ninth month, the fetus is about 20inches long and weighs 6-10 pounds. Body changes during your third trimester During the third trimester, your body will continue to go through many changes.The changes vary and generally return to normal after your baby is born. Physical changes Your weight will continue to increase. You can expect to gain 25-35  pounds (11-16 kg) by the end of the pregnancy if you begin pregnancy at a normal weight. If you are underweight, you can expect to gain 28-40 lb (about 13-18 kg), and if you are overweight, you can expect to gain 15-25 lb (about 7-11 kg). You may begin to get stretch marks on your hips, abdomen, and breasts. Your breasts will continue to grow and may hurt. A yellow fluid (colostrum) may leak from your breasts. This is the first milk you are producing for your baby. You may have changes in your hair. These can include thickening of your hair, rapid growth, and changes in texture. Some people also have hair loss during or after pregnancy, or hair that feels dry or thin. Your belly button may stick out. You may notice more swelling in your hands, face, or ankles. Health changes You may have heartburn. You may have constipation. You may develop hemorrhoids. You may develop swollen, bulging veins (varicose veins) in your legs. You may have increased body aches in the pelvis, back, or thighs. This is due to weight gain and increased hormones that are relaxing your joints. You may have increased tingling or numbness in your hands, arms, and legs. The skin on your abdomen may also feel numb. You may feel short of breath because of your expanding uterus. Other changes You may urinate more often because the fetus is moving lower into your pelvis and pressing on your bladder. You may have more problems sleeping. This may be caused by the size of your abdomen, an increased need to urinate, and an increase in your body's metabolism. You may notice the fetus "dropping," or moving lower in your abdomen (lightening). You may have increased vaginal discharge. You may notice that you have pain around your pelvic bone as your uterus distends. Follow these instructions at home: Medicines Follow your health care provider's instructions regarding medicine use. Specific medicines may be either safe or unsafe to take  during pregnancy. Do not take any medicines unless approved by your health care provider. Take a prenatal vitamin that contains at least 600 micrograms (mcg) of folic acid. Eating and drinking Eat a healthy diet that includes fresh fruits and vegetables, whole grains, good sources of protein such as meat, eggs, or tofu, and low-fat dairy products. Avoid raw meat and unpasteurized juice, milk, and cheese. These carry germs that can harm you and your baby. Eat 4 or 5 small meals rather than 3 large meals a day. You may need to take these actions to prevent or treat constipation: Drink enough fluid to keep your urine pale yellow. Eat foods that are high in fiber, such as beans, whole grains, and fresh fruits and vegetables. Limit foods that are high in fat and processed sugars, such as fried or sweet foods. Activity Exercise only as directed by your health care provider. Most people can continue their usual exercise routine during pregnancy.  Try to exercise for 30 minutes at least 5 days a week. Stop exercising if you experience contractions in the uterus. Stop exercising if you develop pain or cramping in the lower abdomen or lower back. Avoid heavy lifting. Do not exercise if it is very hot or humid or if you are at a high altitude. If you choose to, you may continue to have sex unless your health care provider tells you not to. Relieving pain and discomfort Take frequent breaks and rest with your legs raised (elevated) if you have leg cramps or low back pain. Take warm sitz baths to soothe any pain or discomfort caused by hemorrhoids. Use hemorrhoid cream if your health care provider approves. Wear a supportive bra to prevent discomfort from breast tenderness. If you develop varicose veins: Wear support hose as told by your health care provider. Elevate your feet for 15 minutes, 3-4 times a day. Limit salt in your diet. Safety Talk to your health care provider before traveling far  distances. Do not use hot tubs, steam rooms, or saunas. Wear your seat belt at all times when driving or riding in a car. Talk with your health care provider if someone is verbally or physically abusive to you. Preparing for birth To prepare for the arrival of your baby: Take prenatal classes to understand, practice, and ask questions about labor and delivery. Visit the hospital and tour the maternity area. Purchase a rear-facing car seat and make sure you know how to install it in your car. Prepare the baby's room or sleeping area. Make sure to remove all pillows and stuffed animals from the baby's crib to prevent suffocation. General instructions Avoid cat litter boxes and soil used by cats. These carry germs that can cause birth defects in the baby. If you have a cat, ask someone to clean the litter box for you. Do not douche or use tampons. Do not use scented sanitary pads. Do not use any products that contain nicotine or tobacco, such as cigarettes, e-cigarettes, and chewing tobacco. If you need help quitting, ask your health care provider. Do not use any herbal remedies, illegal drugs, or medicines that were not prescribed to you. Chemicals in these products can harm your baby. Do not drink alcohol. You will have more frequent prenatal exams during the third trimester. During a routine prenatal visit, your health care provider will do a physical exam, perform tests, and discuss your overall health. Keep all follow-up visits. This is important. Where to find more information American Pregnancy Association: americanpregnancy.org [employer]necologists: https://www.todd-brady.net/ Office on [employer] Health: MightyReward.co.nz Contact a health care provider if you have: A fever. Mild pelvic cramps, pelvic pressure, or nagging pain in your abdominal area or lower back. Vomiting or diarrhea. Bad-smelling vaginal discharge or foul-smelling  urine. Pain when you urinate. A headache that does not go away when you take medicine. Visual changes or see spots in front of your eyes. Get help right away if: Your water breaks. You have regular contractions less than 5 minutes apart. You have spotting or bleeding from your vagina. You have severe abdominal pain. You have difficulty breathing. You have chest pain. You have fainting spells. You have not felt your baby move for the time period told by your health care provider. You have new or increased pain, swelling, or redness in an arm or leg. Summary The third trimester of pregnancy is from week 28 through week 40 (months 7 through 9). You may have more  problems sleeping. This can be caused by the size of your abdomen, an increased need to urinate, and an increase in your body's metabolism. You will have more frequent prenatal exams during the third trimester. Keep all follow-up visits. This is important. This information is not intended to replace advice given to you by your health care provider. Make sure you discuss any questions you have with your healthcare provider. Document Revised: 07/03/2019 Document Reviewed: 05/09/2019 Elsevier Patient Education  2022 [employer].       Contraception Choices - www.bedsider.org Contraception, also called birth control, refers to methods or devices thatprevent pregnancy. Hormonal methods  Contraceptive implant A contraceptive implant is a thin, plastic tube that contains a hormone that prevents pregnancy. It is different from an intrauterine device (IUD). It is inserted into the upper part of the arm by a health care provider. Implants canbe effective for up to 3 years. Progestin-only injections Progestin-only injections are injections of progestin, a synthetic form of thehormone progesterone. They are given every 3 months by a health care provider. Birth control pills Birth control pills are pills that contain hormones that  prevent pregnancy. They must be taken once a day, preferably at the same time each day. Aprescription is needed to use this method of contraception. Birth control patch The birth control patch contains hormones that prevent pregnancy. It is placed on the skin and must be changed once a week for three weeks and removed on thefourth week. A prescription is needed to use this method of contraception. Vaginal ring A vaginal ring contains hormones that prevent pregnancy. It is placed in the vagina for three weeks and removed on the fourth week. After that, the process is repeated with a new ring. A prescription is needed to use this method ofcontraception. Emergency contraceptive Emergency contraceptives prevent pregnancy after unprotected sex. They come in pill form and can be taken up to 5 days after sex. They work best the sooner they are taken after having sex. Most emergency contraceptives are available without a prescription. This method should not be used as your only form ofbirth control. Barrier methods  Female condom A female condom is a thin sheath that is worn over the penis during sex. Condoms keep sperm from going inside a woman's body. They can be used with a sperm-killing substance (spermicide) to increase their effectiveness. They should be thrown away after one use. Female condom A female condom is a soft, loose-fitting sheath that is put into the vagina before sex. The condom keeps sperm from going inside a woman's body. Theyshould be thrown away after one use. Diaphragm A diaphragm is a soft, dome-shaped barrier. It is inserted into the vagina before sex, along with a spermicide. The diaphragm blocks sperm from entering the uterus, and the spermicide kills sperm. A diaphragm should be left in thevagina for 6-8 hours after sex and removed within 24 hours. A diaphragm is prescribed and fitted by a health care provider. A diaphragm should be replaced every 1-2 years, after giving birth, after  gaining more than15 lb (6.8 kg), and after pelvic surgery. Cervical cap A cervical cap is a round, soft latex or plastic cup that fits over the cervix. It is inserted into the vagina before sex, along with spermicide. It blocks sperm from entering the uterus. The cap should be left in place for 6-8 hours after sex and removed within 48 hours. A cervical cap must be prescribed andfitted by a health care provider. It should be replaced every  2 years. Sponge A sponge is a soft, circular piece of polyurethane foam with spermicide in it. The sponge helps block sperm from entering the uterus, and the spermicide kills sperm. To use it, you make it wet and then insert it into the vagina. It should be inserted before sex, left in for at least 6 hours after sex, and removed andthrown away within 30 hours. Spermicides Spermicides are chemicals that kill or block sperm from entering the cervix and uterus. They can come as a cream, jelly, suppository, foam, or tablet. A spermicide should be inserted into the vagina with an applicator at least 10-15 minutes before sex to allow time for it to work. The process must be repeatedevery time you have sex. Spermicides do not require a prescription. Intrauterine contraception Intrauterine device (IUD) An IUD is a T-shaped device that is put in a woman's uterus. There are two types: Hormone IUD.This type contains progestin, a synthetic form of the hormone progesterone. This type can stay in place for 3-5 years. Copper IUD.This type is wrapped in copper wire. It can stay in place for 10 years. Permanent methods of contraception Female tubal ligation In this method, a woman's fallopian tubes are sealed, tied, or blocked duringsurgery to prevent eggs from traveling to the uterus. Hysteroscopic sterilization In this method, a small, flexible insert is placed into each fallopian tube. The inserts cause scar tissue to form in the fallopian tubes and block them, so sperm cannot  reach an egg. The procedure takes about 3 months to be effective.Another form of birth control must be used during those 3 months. Female sterilization This is a procedure to tie off the tubes that carry sperm (vasectomy). After the procedure, the man can still ejaculate fluid (semen). Another form of birth control must be used for 3 months after the procedure. Natural planning methods Natural family planning In this method, a couple does not have sex on days when the woman could become pregnant. Calendar method In this method, the woman keeps track of the length of each menstrual cycle, identifies the days when pregnancy can happen, and does not have sex on those days. Ovulation method In this method, a couple avoids sex during ovulation. Symptothermal method This method involves not having sex during ovulation. The woman typically checks for ovulation bywatching changes in her temperature and in the consistency of cervical mucus. Post-ovulation method In this method, a couple waits to have sex until after ovulation. Where to find more information Centers for Disease Control and Prevention: [employer] Summary Contraception, also called birth control, refers to methods or devices that prevent pregnancy. Hormonal methods of contraception include implants, injections, pills, patches, vaginal rings, and emergency contraceptives. Barrier methods of contraception can include female condoms, female condoms, diaphragms, cervical caps, sponges, and spermicides. There are two types of IUDs (intrauterine devices). An IUD can be put in a woman's uterus to prevent pregnancy for 3-5 years. Permanent sterilization can be done through a procedure for males and females. Natural family planning methods involve nothaving sex on days when the woman could become pregnant. This information is not intended to replace advice given to you by your health care provider. Make sure you discuss any questions you have with  your healthcare provider. Document Revised: 07/01/2019 Document Reviewed: 07/01/2019 Elsevier Patient Education  2022 [employer]       Oral Glucose Tolerance Test During Pregnancy Why am I having this test? The oral glucose tolerance test (OGTT) is done to check how your body  processes blood sugar (glucose). This is one of several tests used to diagnose diabetes that develops during pregnancy (gestational diabetes mellitus). Gestational diabetes is a short-term form of diabetes that some women develop while they are pregnant. It usually occurs during the second trimesterof pregnancy and goes away after delivery. Testing, or screening, for gestational diabetes usually occurs at weeks 24-28 of pregnancy. You may have the OGTT test after having a 1-hour glucose screening test if the results from that test indicate that you may have gestational diabetes. This test may also be needed if: You have a history of gestational diabetes. There is a history of giving birth to very large babies or of losing pregnancies (having stillbirths). You have signs and symptoms of diabetes, such as: Changes in your eyesight. Tingling or numbness in your hands or feet. Changes in hunger, thirst, and urination, and these are not explained by your pregnancy. What is being tested? This test measures the amount of glucose in your blood at different timesduring a period of 3 hours. This shows how well your body can process glucose. What kind of sample is taken?  Blood samples are required for this test. They are usually collected byinserting a needle into a blood vessel. How do I prepare for this test? For 3 days before your test, eat normally. Have plenty of carbohydrate-rich foods. Follow instructions from your health care provider about: Eating or drinking restrictions on the day of the test. You may be asked not to eat or drink anything other than water (to fast) starting 8-10 hours before the test. Changing  or stopping your regular medicines. Some medicines may interfere with this test. Tell a health care provider about: All medicines you are taking, including vitamins, herbs, eye drops, creams, and over-the-counter medicines. Any blood disorders you have. Any surgeries you have had. Any medical conditions you have. What happens during the test? First, your blood glucose will be measured. This is referred to as your fasting blood glucose because you fasted before the test. Then, you will drink a glucose solution that contains a certain amount of glucose. Your blood glucosewill be measured again 1, 2, and 3 hours after you drink the solution. This test takes about 3 hours to complete. You will need to stay at the testing location during this time. During the testing period: Do not eat or drink anything other than the glucose solution. Do not exercise. Do not use any products that contain nicotine or tobacco, such as cigarettes, e-cigarettes, and chewing tobacco. These can affect your test results. If you need help quitting, ask your health care provider. The testing procedure may vary among health care providers and hospitals. How are the results reported? Your results will be reported as milligrams of glucose per deciliter of blood (mg/dL) or millimoles per liter (mmol/L). There is more than one source for screening and diagnosis reference values used to diagnose gestational diabetes. Your health care provider will compare your results to normal values that were established after testing a large group of people (reference values). Reference values may vary among labs and hospitals. For this test (Carpenter-Coustan), reference values are: Fasting: 95 mg/dL (5.3 mmol/L). 1 hour: 180 mg/dL (16.1 mmol/L). 2 hour: 155 mg/dL (8.6 mmol/L). 3 hour: 140 mg/dL (7.8 mmol/L). What do the results mean? Results below the reference values are considered normal. If two or more of your blood glucose levels are at or  above the reference values, you may be diagnosed with gestational diabetes. If only one  level is high, your healthcare provider may suggest repeat testing or other tests to confirm a diagnosis. Talk with your health care provider about what your results mean. Questions to ask your health care provider Ask your health care provider, or the department that is doing the test: When will my results be ready? How will I get my results? What are my treatment options? What other tests do I need? What are my next steps? Summary The oral glucose tolerance test (OGTT) is one of several tests used to diagnose diabetes that develops during pregnancy (gestational diabetes mellitus). Gestational diabetes is a short-term form of diabetes that some women develop while they are pregnant. You may have the OGTT test after having a 1-hour glucose screening test if the results from that test show that you may have gestational diabetes. You may also have this test if you have any symptoms or risk factors for this type of diabetes. Talk with your health care provider about what your results mean. This information is not intended to replace advice given to you by your health care provider. Make sure you discuss any questions you have with your healthcare provider. Document Revised: 07/04/2019 Document Reviewed: 07/04/2019 Elsevier Patient Education  2022 [employer].

## 2020-10-03 ENCOUNTER — Other Ambulatory Visit: Payer: Self-pay

## 2020-10-07 ENCOUNTER — Telehealth: Payer: Self-pay | Admitting: *Deleted

## 2020-10-07 ENCOUNTER — Other Ambulatory Visit: Payer: Self-pay | Admitting: *Deleted

## 2020-10-07 ENCOUNTER — Encounter: Payer: Self-pay | Admitting: *Deleted

## 2020-10-07 DIAGNOSIS — O219 Vomiting of pregnancy, unspecified: Secondary | ICD-10-CM

## 2020-10-07 MED ORDER — PROMETHAZINE HCL 25 MG RE SUPP: 25.0000 mg | Freq: Four times a day (QID) | RECTAL | 0 refills | Status: DC | PRN

## 2020-10-07 NOTE — Telephone Encounter (Signed)
TC from patient requesting refill of phenergan suppositories. Refill sent. Patient is active in MyChart. Message sent via MyChart.

## 2020-10-14 ENCOUNTER — Other Ambulatory Visit: Payer: BC Managed Care – PPO

## 2020-10-14 ENCOUNTER — Other Ambulatory Visit: Payer: Self-pay

## 2020-10-14 ENCOUNTER — Ambulatory Visit (INDEPENDENT_AMBULATORY_CARE_PROVIDER_SITE_OTHER): Payer: BC Managed Care – PPO | Admitting: Medical

## 2020-10-14 ENCOUNTER — Encounter: Payer: Self-pay | Admitting: Medical

## 2020-10-14 VITALS — BP 124/79 | HR 104 | Wt 120.8 lb

## 2020-10-14 DIAGNOSIS — Z23 Encounter for immunization: Secondary | ICD-10-CM

## 2020-10-14 DIAGNOSIS — Z3480 Encounter for supervision of other normal pregnancy, unspecified trimester: Secondary | ICD-10-CM

## 2020-10-14 DIAGNOSIS — O219 Vomiting of pregnancy, unspecified: Secondary | ICD-10-CM

## 2020-10-14 DIAGNOSIS — D563 Thalassemia minor: Secondary | ICD-10-CM

## 2020-10-14 DIAGNOSIS — Z3A27 27 weeks gestation of pregnancy: Secondary | ICD-10-CM

## 2020-10-14 DIAGNOSIS — O99891 Other specified diseases and conditions complicating pregnancy: Secondary | ICD-10-CM

## 2020-10-14 DIAGNOSIS — D573 Sickle-cell trait: Secondary | ICD-10-CM

## 2020-10-14 DIAGNOSIS — M549 Dorsalgia, unspecified: Secondary | ICD-10-CM

## 2020-10-14 MED ORDER — PROMETHAZINE HCL 25 MG RE SUPP: 25.0000 mg | Freq: Four times a day (QID) | RECTAL | 3 refills | Status: DC | PRN

## 2020-10-14 NOTE — Progress Notes (Signed)
   PRENATAL VISIT NOTE  Subjective:  Crystal Bentley is a 25 y.o. G1P0000 at [redacted]w[redacted]d being seen today for ongoing prenatal care.  She is currently monitored for the following issues for this low-risk pregnancy and has Supervision of other normal pregnancy, antepartum; Sickle cell trait (HCC); Alpha thalassemia silent carrier; No blood products; and Nausea and vomiting during pregnancy on their problem list.  Patient reports backache and nausea.  Contractions: Not present. Vag. Bleeding: None.  Movement: Present. Denies leaking of fluid.   The following portions of the patient's history were reviewed and updated as appropriate: allergies, current medications, past family history, past medical history, past social history, past surgical history and problem list.   Objective:   Vitals:   10/14/20 1012  BP: 124/79  Pulse: (!) 104  Weight: 120 lb 12.8 oz (54.8 kg)    Fetal Status: Fetal Heart Rate (bpm): 144 Fundal Height: 25 cm Movement: Present     General:  Alert, oriented and cooperative. Patient is in no acute distress.  Skin: Skin is warm and dry. No rash noted.   Cardiovascular: Normal heart rate noted  Respiratory: Normal respiratory effort, no problems with respiration noted  Abdomen: Soft, gravid, appropriate for gestational age.  Pain/Pressure: Absent     Pelvic: Cervical exam deferred        Extremities: Normal range of motion.  Edema: None  Mental Status: Normal mood and affect. Normal behavior. Normal judgment and thought content.   Assessment and Plan:  Pregnancy: G1P0000 at [redacted]w[redacted]d 1. Supervision of other normal pregnancy, antepartum - Glucose Tolerance, 2 Hours w/1 Hour - CBC - RPR - HIV Antibody (routine testing w rflx) - Tdap vaccine greater than or equal to 7yo IM - Peds list given and importance of choosing peds discussed  - Undecided on MOC, plans to discuss with husband and at next visit   2. Sickle cell trait (HCC) - Had [facility] 6/27  3. Alpha thalassemia  silent carrier - Had [facility] 6/27  4. Back pain in pregnancy - Recommended abdominal binder, hydrotherapy, heat and tylenol   5. Nausea and vomiting during pregnancy - promethazine (PHENERGAN) 25 MG suppository; Place 1 suppository (25 mg total) rectally every 6 (six) hours as needed for nausea or vomiting.  Dispense: 12 suppository; Refill: 3  6. [redacted] weeks gestation of pregnancy  Preterm labor symptoms and general obstetric precautions including but not limited to vaginal bleeding, contractions, leaking of fluid and fetal movement were reviewed in detail with the patient. Please refer to After Visit Summary for other counseling recommendations.   Return in about 2 weeks (around 10/28/2020) for LOB, In-Person.  Future Appointments  Date Time Provider Department Center  10/30/2020  3:00 PM Crystal Bentley, [place], DO [facility] None    Crystal Nipple, PA-C

## 2020-10-14 NOTE — Progress Notes (Signed)
ROB 27.[redacted] wks GA GTT, CBC, HIV, RPR today TDAP offered and accepted. Depression and anxiety screen negative.  Reports "pulling in my hips" both sides  Needs refills on phenergan suppositories, using every 6 hours OTC. Refilled X 1 last week. Not taking pepcid, dicglegis, or zofran.

## 2020-10-14 NOTE — Patient Instructions (Signed)
AREA PEDIATRIC/FAMILY PRACTICE PHYSICIANS  [place] ([address]) [facility] Chambliss, MD; Eniola, MD; Hale, MD; Hensel, MD; McDiarmid, MD; McIntyer, MD; Neal, MD; Walden, MD [address] [place], [place] [address] [phone] Mon-Fri 8:30-12:30, 1:30-5:00 Providers come to see babies at [facility] Accepting Medicaid [facility] providers who accept newborns: Koirala, MD; Morrow, MD; Wolters, MD [address], [place], [place] [address] [phone] Mon-Fri 8:00-5:30 Babies seen by providers at [facility] Does NOT accept Medicaid Please call early in hospitalization for appointment (limited availability)  [facility] Mulberry, MD [address] [place], [place] [address] [phone] Mon, Tue, Thur, Fri 8:30-5:00, Wed 10:00-7:00 (closed 1-2pm) Babies seen by [facility] providers Accepting Medicaid Rubin - Pediatrician Rubin, MD [address], [place], [place] [address] [phone] Mon-Fri 8:30-5:00, Sat 8:30-12:00 Provider comes to see babies at [facility] Accepting Medicaid Must have been referred from current patients or contacted office prior to delivery [facility] ([facility]) Brown, MD; Chandler, MD; Ettefagh, MD; Grant, MD; Lester, MD; McCormick, MD; McQueen, MD; Prose, MD; Simha, MD; Stanley, MD; Stryffeler, NP; Tebben, NP [address], [place], [place] [address] [phone] Mon, Tue, Thur, Fri 8:30-5:30, Wed 9:30-5:30, Sat 8:30-12:30 Babies seen by [facility] providers Accepting Medicaid Only accepting infants of first-time parents or siblings of current patients Hospital discharge coordinator will make follow-up appointment Jack Amos [address], [place], [place]  [address] [phone]   Fax - [phone] [facility] [address], [place], [place]  [address] Phone - [phone]   Fax - [phone] Shilpa Gosrani [address], [place], [place]  [address] [phone]  [place] ([address]) [facility] Bates, MD; Brassfield, MD; Cooper, Cox, MD; MD; Davis, MD; Dovico, MD; Ettefaugh, MD; Little, MD; Lowe, MD; Keiffer, MD; Melvin, MD; Sumner, MD; Williams, MD [address], [place], [place] [address] [phone] Mon-Fri 8:30-5:00 (extended evenings Mon-Thur as needed), Sat-Sun 10:00-1:00 Providers come to see babies at [facility] Accepting Medicaid for families of first-time babies and families with all children in the household age 3 and under. Must register with office prior to making appointment (M-F only). [facility] Henson, NP; Knapp, MD; Lalonde, MD; Tysinger, PA [address] [place], [place] [address] [phone] Mon-Fri 8:00-5:00 Babies seen by providers at [facility] Does NOT accept Medicaid/Commercial Insurance Only [facility] ([facility])  Artis, MD; Barnes, MD; Bratton, MD; Coccaro, MD; [place] Gardner, MD; Kramer, MD; Marshall, MD; Netherton, MD; Poleto, MD; Skinner, MD [address] [place], [place] [address] [phone] Mon-Fri 8:30-5:30, Sat (Oct.-Mar.) 9:00-1:00 Babies seen by providers at [facility] Accepting Medicaid  [place] ([address]) [facility] Reid, MD; Warner, MD [address], [place], [place] [address] [phone] Mon-Fri 8:30-5:00, Sat 8:30-12:00 Providers come to see babies at [facility] Does NOT accept Medicaid [facility] Becker, PA; Hagler, MD; Scifres, PA; Sun, MD; Swayne, MD [address], [place], [place] [address] [phone] Mon-Fri 8:00-5:00 Babies seen by providers at [facility] Does NOT accept Medicaid Only accepting babies of parents who  are patients Please call early in hospitalization for appointment (limited availability)  Pediatricians Clark, MD; Frye, MD; Kelleher, MD; Mack, NP; Miller, MD; O'Keller, MD; Patterson, NP; Pudlo, MD; Puzio, MD; Thomas, MD; Tucker, MD; Twiselton, MD [address], [place], [place] [address] [phone] Mon-Fri 8:00-5:00, Sat 9:00-12:00 Providers come to see babies at [facility] Does NOT accept Medicaid  [place] ([address]) [facility] providers accepting new patients: Brake, NP; Wharton, PA [address], [place], [place] [address] [phone] Mon-Fri 8:00-5:00 Babies seen by providers at [facility] Does NOT accept Medicaid Only accepting babies of parents who are patients Please call early in hospitalization for appointment (limited availability) [facility] Gay, MD; Quinlan, MD [address] [place], [place] [address] [phone] (press 1 to schedule appointment) Mon-Fri 8:00-5:00 Providers come to see babies at [facility] Does NOT accept Medicaid [facility] Mazer, MD [address] [place], [place] [address] [phone] Mon-Fri 8:30-5:00 (lunch 12:30-1:00), extended hours by appointment only Wed 5:00-6:30 Babies seen by [facility] providers Accepting Medicaid [facility] Banks, MD; Jordan, MD; Koberlein, MD [address], [place], [place] [address] [phone] Mon-Fri 8:00-5:00 Babies seen by [facility] providers Does NOT accept Medicaid [facility] Parker, MD; Hunter, MD; Wallace, DO [address] [place], [place] [address] [phone] Mon-Fri 8:00-5:00 Babies seen by [facility] providers Does NOT accept Medicaid [facility] Brandon, PA; Brecken, PA; Christy, NP; Dees, MD; DeClaire, MD; DeWeese, MD; Hansen, NP; Mills, NP; Parrish, NP; Smoot, NP; Summer, MD; Vapne,  MD [address] [place], [place] [address] [phone] Mon-Fri 8:30-5:00, Sat 10:00-1:00 Providers come to see babies at [facility] Does NOT accept Medicaid Free prenatal information session Tuesdays at 4:45pm [facility] Bouska, MD; Gordon, PA; Jeffery, PA; Weber, PA [address] [place] [address] [phone] Mon-Fri 7:30-5:30 Babies seen by [facility] providers [facility] [address], [place], [place]  [address] [phone]   Fax - [phone]  [place] ([address] & [address]) [facility] [place] [address] [place], [place] [address] [phone] Mon-Thur 8:00-6:00 Providers come to see babies at [facility] Accepting Medicaid [facility] Anderson, NP; Badger, MD; Beal, PA; Spencer, PA [address] [place], [place] [address] [phone] Mon-Thur 7:30-7:30, Fri 7:30-4:30 Babies seen by [facility] providers Accepting Medicaid [facility] Agbuya, MD; Klett, NP; Romgoolam, MD [address], [place], [place] [address] [phone] Mon-Fri 8:30-5:00, Sat 8:30-12:00 Providers come to see babies at [facility] Accepting Medicaid Must have "Meet & Greet" appointment at office prior to delivery [facility] ([facility]) McCord, MD; Wallace, MD; Wood, MD [address], [place], [place] [address] [phone] Mon-Wed 8:00-6:00, Thur-Fri 8:00-5:00, Sat 9:00-12:00 Providers come to see babies at [facility] Does NOT accept Medicaid Only accepting siblings of current patients [facility]  [address], [place], [place]  [address] [phone]   Fax - [phone] [facility] [address], [place], [place]  [address] [phone]   Fax -  [phone]  [place] ([address] & [address]) [facility] Cirigliano, DO; Matthews, DO [address] [place], [place] [address] [phone] Mon-Fri 7:00-5:00 Babies seen by [facility] providers Does NOT accept Medicaid [facility] Briscoe, MD; Howley, PA; Moreira, PA [address], [place], [place] [address] [phone] Mon-Fri 8:00-5:00 Babies seen by [facility] providers Accepting Medicaid [facility] Boyd, MD; Church, PA; Jones, NP; Osborn, PA [address], [place],  [address] [phone] Mon-Fri 8:00-5:00 Babies seen by providers at [facility] Accepting Medicaid  [place] ([address]) [facility] [place] Wendling, DO [address] [place], [place] [address] [phone] Mon-Fri 8:00-5:00 Babies seen by [facility] providers Does NOT accept Medicaid Limited availability, please call early in hospitalization to schedule follow-up [facility] Calderon, PA; Cummings, MD; Dillard, MD; Martin, PA; Olson, MD; VanDeven, PA [address], [place], [place] [address] [phone] Mon-Fri 8:30-5:00, Sat 9:00-12:00 Babies seen by providers at [facility] Accepting Medicaid Please register online then schedule online or call office [email or web address] Hunter, NP; Kumar, MD; Martin Rogers, PA [address], [place], [place] [address] [phone] Mon-Fri 8:00-5:00 Babies seen by providers at [facility] Accepting Medicaid [facility] ([facility]) Powhattan, MD; Kristi Fleenor, NP; West, MD [address], [place], [place] [address] [phone] Mon-Fri 8:00-5:30, Sat&Sun by appointment (phones open at  8:30) Babies seen by [facility] providers Accepting Medicaid Must be a first-time baby or sibling of current patient [facility] [place]  [address], [place], [place]  [address] [phone]   Fax - [phone]  [place] ([address]) [facility] Brown, PA; Cowen, PA; Rice, MD; [place], PA; Spry, MD [address] [place], [place] [address] [phone] Mon-Thur 8:00-7:00, Fri 8:00-5:00, Sat 8:00-12:00, Sun 9:00-12:00 Babies seen by [facility] providers Accepting Medicaid [facility] Coe-Goins, MD; Marshall, MD; Pierre-Louis, MD [address], [place], [place] [address] [phone] Mon-Thur 8:00-5:00 Babies seen by providers at [facility] Accepting Medicaid [facility] Bratton, MD; Coe-Goins, MD; Hayes, MD; Lewis, MD; List, MD; Lott, MD; Marshall, MD; Moran, MD; O'Neal, MD; Pierre-Louis, MD; Pitonzo, MD; Scholer, MD; Spangle, MD [address] [place], [place] [address] [phone] Mon-Fri 8:00-5:30, Sat (Oct.-Mar.) 9:00-1:00 Babies seen by providers at [facility] Accepting Medicaid Must fill out new patient packet, available online at [email or web address] [facility] ([facility]) Friddle, NP; Harris, NP; Kelly, NP; Logan, MD; Melvin, PA; Poth, MD; Ramadoss, MD; Stanton, NP [address], [place], [place] [address] [phone] Mon-Thur 8:00-5:30, Fri 8:00-5:00 Babies seen by providers at [facility] Accepting Medicaid  [place] ([address]) [facility] Dixon, PA; [place], MD; Pickard, MD; Tapia, PA [address], [place], [place] [address] [phone] Mon-Fri 8:00-5:00 Babies seen by providers at [facility] Accepting Medicaid   [place] ([address]) [facility] [place] Masneri, DO; Meyers, MD; [place], PA [address], [place], [place] [address] [phone] Mon-Fri 8:00-5:00 Babies seen by providers at [facility] Does NOT accept Medicaid Limited appointment availability, please call early in hospitalization  [facility] [place] Kunedd, DO; McGowen, MD [address], [place], [place] [address] [phone] Mon-Fri 8:00-5:00 Babies seen by [facility] providers Does NOT accept Medicaid [facility] [place] Cameron, MD; MacDonald, MD; Michaels, PA; Nayak, MD [address] [place] [address], [place], [place] [address] [phone] Mon-Fri 8:00-5:00 After hours clinic ([address] [place],  [address]) [phone] Mon-Fri 5:00-8:00, Sat 12:00-6:00, Sun 10:00-4:00 Babies seen by [facility] providers Accepting Medicaid [facility] [place] [address], [place], [place]  [address] [phone]   Fax - [phone]  [place] ([address]) [facility] [place] [facility] Andy, MD [address], [place], [place] [address] [phone] Mon-Fri 8:00-5:00 Babies seen by [facility] providers Does NOT accept Medicaid [facility] [place] ([facility] [place]) Eksir, MD [address], [place], [place] [address] [phone] Mon-Thur 8:00-7:00, Fri 8:00-5:00, Sat 8:00-12:00 Babies seen by providers at [facility] Accepting Medicaid - but does not have vaccinations in office (must be received elsewhere) Limited availability, please call early in hospitalization  [place] ([address]) [facility]  Charlene Flemming, MD [address],  [place] [address] [phone]  Fax [phone]  [facility]  Kimberly Newton, MD, Annamarie Streilein, PA, Carla Hampton, PA [address] [place], [place]  [address] [phone] [facility]  [address], [place], [place] [address] [phone] [address], [place], [place] [address] [phone] (West Office)  [facility] [address], [place], [place] [address] [phone] [facility] [address], [place], [place] [address] [phone] [facility] [address], [place], [place] [address] [phone] [facility] [address], [place], [place] [address] [phone] [facility] [address], [place], [place] [address] [phone] [facility] [address], [place], [place] [address] [phone] [facility]  [address], [place], [place] [address] [phone] Dr[facility] [address] [place] [address], [place], [place] [address] [phone] [facility] [address], [place], [place] [address] [phone] [facility] [address], [place], [place] [address] [phone]  

## 2020-10-15 LAB — CBC
Hematocrit: 33.7 % — ABNORMAL LOW (ref 34.0–46.6)
Hemoglobin: 10.6 g/dL — ABNORMAL LOW (ref 11.1–15.9)
MCH: 27 pg (ref 26.6–33.0)
MCHC: 31.5 g/dL (ref 31.5–35.7)
MCV: 86 fL (ref 79–97)
Platelets: 343 10*3/uL (ref 150–450)
RBC: 3.92 x10E6/uL (ref 3.77–5.28)
RDW: 11.6 % — ABNORMAL LOW (ref 11.7–15.4)
WBC: 11.6 10*3/uL — ABNORMAL HIGH (ref 3.4–10.8)

## 2020-10-15 LAB — HIV ANTIBODY (ROUTINE TESTING W REFLEX): HIV Screen 4th Generation wRfx: NONREACTIVE

## 2020-10-15 LAB — GLUCOSE TOLERANCE, 2 HOURS W/ 1HR
Glucose, 1 hour: 110 mg/dL (ref 65–179)
Glucose, 2 hour: 96 mg/dL (ref 65–152)
Glucose, Fasting: 73 mg/dL (ref 65–91)

## 2020-10-15 LAB — RPR: RPR Ser Ql: NONREACTIVE

## 2020-10-28 ENCOUNTER — Ambulatory Visit (INDEPENDENT_AMBULATORY_CARE_PROVIDER_SITE_OTHER): Payer: BC Managed Care – PPO | Admitting: Women's Health

## 2020-10-28 ENCOUNTER — Other Ambulatory Visit: Payer: Self-pay

## 2020-10-28 VITALS — BP 102/74 | HR 90 | Wt 121.8 lb

## 2020-10-28 DIAGNOSIS — O219 Vomiting of pregnancy, unspecified: Secondary | ICD-10-CM

## 2020-10-28 DIAGNOSIS — M549 Dorsalgia, unspecified: Secondary | ICD-10-CM

## 2020-10-28 DIAGNOSIS — Z23 Encounter for immunization: Secondary | ICD-10-CM | POA: Diagnosis not present

## 2020-10-28 DIAGNOSIS — Z3480 Encounter for supervision of other normal pregnancy, unspecified trimester: Secondary | ICD-10-CM

## 2020-10-28 DIAGNOSIS — O99891 Other specified diseases and conditions complicating pregnancy: Secondary | ICD-10-CM

## 2020-10-28 DIAGNOSIS — Z3A29 29 weeks gestation of pregnancy: Secondary | ICD-10-CM

## 2020-10-28 MED ORDER — PROMETHAZINE HCL 25 MG RE SUPP: 25.0000 mg | Freq: Four times a day (QID) | RECTAL | 3 refills | Status: DC | PRN

## 2020-10-28 NOTE — Patient Instructions (Signed)
Maternity Assessment Unit (MAU)  The Maternity Assessment Unit (MAU) is located at the [facility]. The address is: [address], [address], [place], [place] [address]. Please see map below for additional directions.    The Maternity Assessment Unit is designed to help you during your pregnancy, and for up to 6 weeks after delivery, with any pregnancy- or postpartum-related emergencies, if you think you are in labor, or if your water has broken. For example, if you experience nausea and vomiting, vaginal bleeding, severe abdominal or pelvic pain, elevated blood pressure or other problems related to your pregnancy or postpartum time, please come to the Maternity Assessment Unit for assistance.       Preterm Labor The normal length of a pregnancy is 39-41 weeks. Preterm labor is when labor starts before 37 completed weeks of pregnancy. Babies who are born prematurely and survive may not be fully developed and may be at an increased risk for long-term problems such as cerebral palsy, developmental delays, and vision and hearing problems. Babies who are born too early may have problems soon after birth. Premature babies may have problems regulating blood sugar, body temperature, heart rate, and breathing rate. These babies often have trouble with feeding. The risk of having problems is highest for babies who are born before 34 weeks of pregnancy. What are the causes? The exact cause of this condition is not known. What increases the risk? You are more likely to have preterm labor if you have certain risk factors that relate to your medical history, problems with present and past pregnancies, and lifestyle factors. Medical history You have abnormalities of the uterus, including a short cervix. You have STIs (sexually transmitted infections) or other infections of the urinary tract and the vagina. You have chronic illnesses, such as blood clotting  problems, diabetes, or high blood pressure. You are overweight or underweight. Present and past pregnancies You have had preterm labor before. You are pregnant with twins or other multiples. You have been diagnosed with a condition in which the placenta covers your cervix (placenta previa). You waited less than 18 months between giving birth and becoming pregnant again. Your unborn baby has some abnormalities. You have vaginal bleeding during pregnancy. You became pregnant through in vitro fertilization (IVF). Lifestyle and environmental factors You use tobacco products or drink alcohol. You use drugs. You have stress and no social support. You experience domestic violence. You are exposed to certain chemicals or environmental pollutants. Other factors You are younger than age 17 or older than age 35. What are the signs or symptoms? Symptoms of this condition include: Cramps similar to those that can happen during a menstrual period. The cramps may happen with diarrhea. Pain in the abdomen or lower back. Regular contractions that may feel like tightening of the abdomen. A feeling of increased pressure in the pelvis. Increased watery or bloody mucus discharge from the vagina. Water breaking (ruptured amniotic sac). How is this diagnosed? This condition is diagnosed based on: Your medical history and a physical exam. A pelvic exam. An ultrasound. Monitoring your uterus for contractions. Other tests, including: A swab of the cervix to check for a chemical called fetal fibronectin. Urine tests. How is this treated? Treatment for this condition depends on the length of your pregnancy, your condition, and the health of your baby. Treatment may include: Taking medicines, such as: Hormone medicines. These may be given early in pregnancy to help support the pregnancy. Medicines to stop   contractions. Medicines to help mature the baby's lungs. These may be prescribed if the risk of  delivery is high. Medicines to help protect your baby from brain and nerve complications such as cerebral palsy. Bed rest. If the labor happens before 34 weeks of pregnancy, you may need to stay in the hospital. Delivery of the baby. Follow these instructions at home:  Do not use any products that contain nicotine or tobacco. These products include cigarettes, chewing tobacco, and vaping devices, such as e-cigarettes. If you need help quitting, ask your health care provider. Do not drink alcohol. Take over-the-counter and prescription medicines only as told by your health care provider. Rest as told by your health care provider. Return to your normal activities as told by your health care provider. Ask your health care provider what activities are safe for you. Keep all follow-up visits. This is important. How is this prevented? To increase your chance of having a full-term pregnancy: Do not use drugs or take medicines that have not been prescribed to you during your pregnancy. Talk with your health care provider before taking any herbal supplements, even if you have been taking them regularly. Make sure you gain a healthy amount of weight during your pregnancy. Watch for infection. If you think that you might have an infection, get it checked right away. Symptoms of infection may include: Fever. Abnormal vaginal discharge or discharge that smells bad. Pain or burning with urination. Needing to urinate urgently. Frequently urinating or passing small amounts of urine frequently. Blood in your urine or urine that smells bad or unusual. Where to find more information [place]. Department of Health and Cyt[employer]ist on Women's Health: http://hoffman.com/ [employer]: www.acog.org Centers for Disease Control and Prevention, Preterm Birth: FootballExhibition.com.br Contact a health care provider if: You think you are going into preterm labor. You have signs  or symptoms of preterm labor. You have symptoms of infection. Get help right away if: You are having regular, painful contractions every 5 minutes or less. Your water breaks. Summary Preterm labor is labor that starts before you reach 37 weeks of pregnancy. Delivering your baby early increases your baby's risk of developing long-term problems. You are more likely to have preterm labor if you have certain risk factors that relate to your medical history, problems with present and past pregnancies, and lifestyle factors. Keep all follow-up visits. This is important. Contact a health care provider if you have signs or symptoms of preterm labor. This information is not intended to replace advice given to you by your health care provider. Make sure you discuss any questions you have with your health care provider. Document Revised: 01/28/2020 Document Reviewed: 01/28/2020 Elsevier Patient Education  2022 Elsevier Inc.       Morning Sickness Morning sickness is when a woman feels nauseous during pregnancy. This nauseous feeling may or may not come with vomiting. It often occurs in the morning, but it can be a problem at any time of day. Morning sickness is most common during the first trimester. In some cases, it may continue throughout pregnancy. Although morning sickness is unpleasant, it is usually harmless unless the woman develops severe and continual vomiting (hyperemesis gravidarum), a condition that requires more intense treatment. What are the causes? The exact cause of this condition is not known, but it seems to be related to normal hormonal changes that occur in pregnancy. What increases the risk? You are more likely to develop this condition if: You  experienced nausea or vomiting before your pregnancy. You had morning sickness during a previous pregnancy. You are pregnant with more than one baby, such as twins. What are the signs or symptoms? Symptoms of this condition  include: Nausea. Vomiting. How is this diagnosed? This condition is usually diagnosed based on your signs and symptoms. How is this treated? In many cases, treatment is not needed for this condition. Making some changes to what you eat may help to control symptoms. Your health care provider may also prescribe or recommend: Vitamin B6 supplements. Anti-nausea medicines. Ginger. Follow these instructions at home: Medicines Take over-the-counter and prescription medicines only as told by your health care provider. Do not use any prescription, over-the-counter, or herbal medicines for morning sickness without first talking with your health care provider. Take multivitamins before getting pregnant. This can prevent or decrease the severity of morning sickness in most women. Eating and drinking Eat a piece of dry toast or crackers before getting out of bed in the morning. Eat 5 or 6 small meals a day. Eat dry and bland foods, such as rice or a baked potato. Foods that are high in carbohydrates are often helpful. Avoid greasy, fatty, and spicy foods. Have someone cook for you if the smell of any food causes nausea and vomiting. If you feel nauseous after taking prenatal vitamins, take the vitamins at night or with a snack. Eat a protein snack between meals if you are hungry. Nuts, yogurt, and cheese are good options. Drink fluids throughout the day. Try ginger ale made with real ginger, ginger tea made from fresh grated ginger, or ginger candies. General instructions Do not use any products that contain nicotine or tobacco. These products include cigarettes, chewing tobacco, and vaping devices, such as e-cigarettes. If you need help quitting, ask your health care provider. Get an air purifier to keep the air in your house free of odors. Get plenty of fresh air. Try to avoid odors that trigger your nausea. Consider trying these methods to help relieve symptoms: Wearing an acupressure wristband.  These wristbands are often worn for seasickness. Acupuncture. Contact a health care provider if: Your home remedies are not working and you need medicine. You feel dizzy or light-headed. You are losing weight. Get help right away if: You have persistent and uncontrolled nausea and vomiting. You faint. You have severe pain in your abdomen. Summary Morning sickness is when a woman feels nauseous during pregnancy. This nauseous feeling may or may not come with vomiting. Morning sickness is most common during the first trimester. It often occurs in the morning, but it can be a problem at any time of day. In many cases, treatment is not needed for this condition. Making some changes to what you eat may help to control symptoms. This information is not intended to replace advice given to you by your health care provider. Make sure you discuss any questions you have with your health care provider. Document Revised: 09/09/2019 Document Reviewed: 08/19/2019 Elsevier Patient Education  2022 Elsevier Inc.       Hyperemesis Gravidarum Hyperemesis gravidarum is a severe form of nausea and vomiting that happens during pregnancy. Hyperemesis is worse than morning sickness. It may cause you to have nausea or vomiting all day for many days. It may keep you from eating and drinking enough food and liquids, which can lead to dehydration, malnutrition, and weight loss. Hyperemesis usually occurs during the first half (the first 20 weeks) of pregnancy. It often goes away once a  woman is in her second half of pregnancy. However, sometimes hyperemesis continues through an entire pregnancy. What are the causes? The cause of this condition is not known. It may be associated with: Changes in hormones in the body during pregnancy. Changes in the gastrointestinal system. Genetic or inherited conditions. What are the signs or symptoms? Symptoms of this condition include: Severe nausea and vomiting that does not  go away. Problems keeping food down. Weight loss. Loss of body fluid (dehydration). Loss of appetite. You may have no desire to eat or you may not like the food you have previously enjoyed. How is this diagnosed? This condition may be diagnosed based on your medical history, your symptoms, and a physical exam. You may also have other tests, including: Blood tests. Urine tests. Blood pressure tests. Ultrasound to look for problems with the placenta or to check if you are pregnant with more than one baby. How is this treated? This condition is managed by controlling symptoms. This may include: Following an eating plan. This can help to lessen nausea and vomiting. Treatments that do not use medicine. These include acupressure bracelets, hypnosis, and eating or drinking foods or fluids that contain ginger, ginger ale, or ginger tea. Taking prescription medicine or over-the-counter medicine as told by your health care provider. Continuing to take prenatal vitamins. You may need to change what kind you take and when you take them. Follow your health care provider's instructions about prenatal vitamins. An eating plan and medicines are often used together to help control symptoms. If medicines do not help relieve nausea and vomiting, you may need to receive fluids through an IV at the hospital. Follow these instructions at home: To help relieve your symptoms, listen to your body. Everyone is different and has different preferences. Find what works best for you. Here are some things you can try to help relieve your symptoms: Meals and snacks  Eat 5-6 small meals daily instead of 3 large meals. Eating small meals and snacks can help you avoid an empty stomach. Before getting out of bed, eat a couple of crackers to avoid moving around on an empty stomach. Eat a protein-rich snack before bed. Examples include cheese and crackers, or a peanut butter sandwich made with 1 slice of whole-wheat bread and 1  tsp (5 g) of peanut butter. Eat and drink slowly. Try eating starchy foods as these are usually tolerated well. Examples include cereal, toast, bread, potatoes, pasta, rice, and pretzels. Eat at least one serving of protein with your meals and snacks. Protein options include lean meats, poultry, seafood, beans, nuts, nut butters, eggs, cheese, and yogurt. Eat or suck on things that have ginger in them. It may help to relieve nausea. Add  tsp (0.44 g) ground ginger to hot tea, or choose ginger tea. Fluids It is important to stay hydrated. Try to: Drink small amounts of fluids often. Drink fluids 30 minutes before or after a meal to help lessen the feeling of a full stomach. Drink 100% fruit juice or an electrolyte drink. An electrolyte drink contains sodium, potassium, and chloride. Drink fluids that are cold, clear, and carbonated or sour. These include lemonade, ginger ale, lemon-lime soda, ice water, and sparkling water. Things to avoid Avoid the following: Eating foods that trigger your symptoms. These may include spicy foods, coffee, high-fat foods, very sweet foods, and acidic foods. Drinking more than 1 cup of fluid at a time. Skipping meals. Nausea can be more intense on an empty stomach. If you  cannot tolerate food, do not force it. Try sucking on ice chips or other frozen items and make up for missed calories later. Lying down within 2 hours after eating. Being exposed to environmental triggers. These may include food smells, smoky rooms, closed spaces, rooms with strong smells, warm or humid places, overly loud and noisy rooms, and rooms with motion or flickering lights. Try eating meals in a well-ventilated area that is free of strong smells. Making quick and sudden changes in your movement. Taking iron pills and multivitamins that contain iron. If you take prescription iron pills, do not stop taking them unless your health care provider approves. Preparing food. The smell of food can  spoil your appetite or trigger nausea. General instructions Brush your teeth or use a mouth rinse after meals. Take over-the-counter and prescription medicines only as told by your health care provider. Follow instructions from your health care provider about eating or drinking restrictions. Talk with your health care provider about starting a supplement of vitamin B6. Continue to take your prenatal vitamins as told by your health care provider. If you are having trouble taking your prenatal vitamins, talk with your health care provider about other options. Keep all follow-up visits. This is important. Follow-up visits include prenatal visits. Contact a health care provider if: You have pain in your abdomen. You have a severe headache. You have vision problems. You are losing weight. You feel weak or dizzy. You cannot eat or drink without vomiting, especially if this goes on for a full day. Get help right away if: You cannot drink fluids without vomiting. You vomit blood. You have constant nausea and vomiting. You are very weak. You faint. You have a fever and your symptoms suddenly get worse. Summary Hyperemesis gravidarum is a severe form of nausea and vomiting that happens during pregnancy. Making some changes to your eating habits may help relieve nausea and vomiting. This condition may be managed with lifestyle changes and medicines as prescribed by your health care provider. If medicines do not help relieve nausea and vomiting, you may need to receive fluids through an IV at the hospital. This information is not intended to replace advice given to you by your health care provider. Make sure you discuss any questions you have with your health care provider. Document Revised: 08/19/2019 Document Reviewed: 08/19/2019 Elsevier Patient Education  2022 [employer].

## 2020-10-28 NOTE — Progress Notes (Signed)
Subjective:  Crystal Bentley is a 25 y.o. G1P0000 at [redacted]w[redacted]d being seen today for ongoing prenatal care.  She is currently monitored for the following issues for this low-risk pregnancy and has Supervision of other normal pregnancy, antepartum; Sickle cell trait (HCC); Alpha thalassemia silent carrier; No blood products; and Nausea and vomiting during pregnancy on their problem list.  Patient reports backache.  Contractions: Not present. Vag. Bleeding: None.  Movement: Present. Denies leaking of fluid.   The following portions of the patient's history were reviewed and updated as appropriate: allergies, current medications, past family history, past medical history, past social history, past surgical history and problem list. Problem list updated.  Objective:   Vitals:   10/28/20 1616  BP: 102/74  Pulse: 90  Weight: 121 lb 12.8 oz (55.2 kg)    Fetal Status: Fetal Heart Rate (bpm): 140 Fundal Height: 27 cm Movement: Present     General:  Alert, oriented and cooperative. Patient is in no acute distress.  Skin: Skin is warm and dry. No rash noted.   Cardiovascular: Normal heart rate noted  Respiratory: Normal respiratory effort, no problems with respiration noted  Abdomen: Soft, gravid, appropriate for gestational age. Pain/Pressure: Absent     Pelvic: Vag. Bleeding: None     Cervical exam deferred        Extremities: Normal range of motion.  Edema: None  Mental Status: Normal mood and affect. Normal behavior. Normal judgment and thought content.   Urinalysis:      Assessment and Plan:  Pregnancy: G1P0000 at [redacted]w[redacted]d  1. Nausea and vomiting during pregnancy -refill phenergan today per pt request -pt states she vomits about once or twice a week, but since the beginning of this week she has bee vomiting twice daily -pt states she is still able to eat and drink normally  2. Supervision of other normal pregnancy, antepartum  3. Back pain in pregnancy -per PT notes, pt declined to  schedule additional appointments after June -pt clarifies that she stopped PT because they were working on making her abdominal muscles soft and then she was told that it would not do any good during pregnancy so she stopped -pt states she was given a list of exercises for back pain when she stopped PT, but states she does not do them because she is tired when she comes home from work -pt states back pain started before pregnancy and would only happen after a stressful day, but then has worsened since pregnancy -pt states pain is in upper back -recommended chiropractic, massage, return to PT, performing home exercises as prescribed -pt does not wish to perform any of those above suggestions -pt states there is only 2 months of pregnancy left and she will just wait until after delivery -pt reports pain is not present daily, but when it is present she sometimes will do the exercises prescribed, which works to relieve the pain  4. [redacted] weeks gestation of pregnancy  Preterm labor symptoms and general obstetric precautions including but not limited to vaginal bleeding, contractions, leaking of fluid and fetal movement were reviewed in detail with the patient. I discussed the assessment and treatment plan with the patient. The patient was provided an opportunity to ask questions and all were answered. The patient agreed with the plan and demonstrated an understanding of the instructions. The patient was advised to call back or seek an in-person office evaluation/go to [facility]U at [facility] for any urgent or concerning symptoms. Please refer to After  Visit Summary for other counseling recommendations.  Return in about 2 weeks (around 11/11/2020) for in-person LOB/APP OK.   Stephon Weathers, [name], NP

## 2020-10-28 NOTE — Progress Notes (Signed)
Patient presents for ROB. Patient complains of bilateral leg pain and lower back pain. She request refill for phenergan suppository.

## 2020-10-30 ENCOUNTER — Ambulatory Visit (INDEPENDENT_AMBULATORY_CARE_PROVIDER_SITE_OTHER): Payer: BC Managed Care – PPO

## 2020-10-30 ENCOUNTER — Other Ambulatory Visit: Payer: Self-pay

## 2020-10-30 ENCOUNTER — Ambulatory Visit (INDEPENDENT_AMBULATORY_CARE_PROVIDER_SITE_OTHER): Payer: BC Managed Care – PPO | Admitting: Cardiology

## 2020-10-30 ENCOUNTER — Encounter: Payer: Self-pay | Admitting: Cardiology

## 2020-10-30 VITALS — BP 111/77 | HR 97 | Ht 64.0 in | Wt 121.8 lb

## 2020-10-30 DIAGNOSIS — R0602 Shortness of breath: Secondary | ICD-10-CM | POA: Diagnosis not present

## 2020-10-30 DIAGNOSIS — R002 Palpitations: Secondary | ICD-10-CM

## 2020-10-30 NOTE — Patient Instructions (Signed)
Medication Instructions:  Your physician recommends that you continue on your current medications as directed. Please refer to the Current Medication list given to you today.  *If you need a refill on your cardiac medications before your next appointment, please call your pharmacy*   Lab Work: None If you have labs (blood work) drawn today and your tests are completely normal, you will receive your results only by: MyChart Message (if you have MyChart) OR A paper copy in the mail If you have any lab test that is abnormal or we need to change your treatment, we will call you to review the results.   Testing/Procedures: Your physician has requested that you have an echocardiogram. Echocardiography is a painless test that uses sound waves to create images of your heart. It provides your doctor with information about the size and shape of your heart and how well your heart's chambers and valves are working. This procedure takes approximately one hour. There are no restrictions for this procedure.  A zio monitor was ordered today. It will remain on for 7 days. You will then return monitor and event diary in provided box. It takes 1-2 weeks for report to be downloaded and returned to [place]. We will call you with the results. If monitor falls off or has orange flashing light, please call Zio for further instructions.   ZIO  WHY IS MY DOCTOR PRESCRIBING ZIO? The Zio system is proven and trusted by physicians to detect and diagnose irregular heart rhythms -- and has been prescribed to hundreds of thousands of patients.  The FDA has cleared the Zio system to monitor for many different kinds of irregular heart rhythms. In a study, physicians were able to reach a diagnosis 90% of the time with the Zio system1.  You can wear the Zio monitor -- a small, discreet, comfortable patch -- during your normal day-to-day activity, including while you sleep, shower, and exercise, while it records every single  heartbeat for analysis.  1Barrett, P., et al. Comparison of 24 Hour Holter Monitoring Versus 14 Day Novel Adhesive Patch Electrocardiographic Monitoring. American Journal of Medicine, 2014.  ZIO VS. HOLTER MONITORING The Zio monitor can be comfortably worn for up to 14 days. Holter monitors can be worn for 24 to 48 hours, limiting the time to record any irregular heart rhythms you may have. Zio is able to capture data for the 51% of patients who have their first symptom-triggered arrhythmia after 48 hours.1  LIVE WITHOUT RESTRICTIONS The Zio ambulatory cardiac monitor is a small, unobtrusive, and water-resistant patch--you might even forget you're wearing it. The Zio monitor records and stores every beat of your heart, whether you're sleeping, working out, or showering. Remove on: Oct 7th 2022   Follow-Up: At [facility], you and your health needs are our priority.  As part of our continuing mission to provide you with exceptional heart care, we have created designated Provider Care Teams.  These Care Teams include your primary Cardiologist (physician) and Advanced Practice Providers (APPs -  Physician Assistants and Nurse Practitioners) who all work together to provide you with the care you need, when you need it.  We recommend signing up for the patient portal called "MyChart".  Sign up information is provided on this After Visit Summary.  MyChart is used to connect with patients for Virtual Visits (Telemedicine).  Patients are able to view lab/test results, encounter notes, upcoming appointments, etc.  Non-urgent messages can be sent to your provider as well.   To  learn more about what you can do with MyChart, go to [email or web address].    Your next appointment:   As needed  The format for your next appointment:   In Person  Provider:   Thomasene Ripple, DO   Other Instructions Echocardiogram An echocardiogram is a test that uses sound waves (ultrasound) to produce images of the  heart. Images from an echocardiogram can provide important information about: Heart size and shape. The size and thickness and movement of your heart's walls. Heart muscle function and strength. Heart valve function or if you have stenosis. Stenosis is when the heart valves are too narrow. If blood is flowing backward through the heart valves (regurgitation). A tumor or infectious growth around the heart valves. Areas of heart muscle that are not working well because of poor blood flow or injury from a heart attack. Aneurysm detection. An aneurysm is a weak or damaged part of an artery wall. The wall bulges out from the normal force of blood pumping through the body. Tell a health care provider about: Any allergies you have. All medicines you are taking, including vitamins, herbs, eye drops, creams, and over-the-counter medicines. Any blood disorders you have. Any surgeries you have had. Any medical conditions you have. Whether you are pregnant or may be pregnant. What are the risks? Generally, this is a safe test. However, problems may occur, including an allergic reaction to dye (contrast) that may be used during the test. What happens before the test? No specific preparation is needed. You may eat and drink normally. What happens during the test?  You will take off your clothes from the waist up and put on a hospital gown. Electrodes or electrocardiogram (ECG)patches may be placed on your chest. The electrodes or patches are then connected to a device that monitors your heart rate and rhythm. You will lie down on a table for an ultrasound exam. A gel will be applied to your chest to help sound waves pass through your skin. A handheld device, called a transducer, will be pressed against your chest and moved over your heart. The transducer produces sound waves that travel to your heart and bounce back (or "echo" back) to the transducer. These sound waves will be captured in real-time and  changed into images of your heart that can be viewed on a video monitor. The images will be recorded on a computer and reviewed by your health care provider. You may be asked to change positions or hold your breath for a short time. This makes it easier to get different views or better views of your heart. In some cases, you may receive contrast through an IV in one of your veins. This can improve the quality of the pictures from your heart. The procedure may vary among health care providers and hospitals. What can I expect after the test? You may return to your normal, everyday life, including diet, activities, and medicines, unless your health care provider tells you not to do that. Follow these instructions at home: It is up to you to get the results of your test. Ask your health care provider, or the department that is doing the test, when your results will be ready. Keep all follow-up visits. This is important. Summary An echocardiogram is a test that uses sound waves (ultrasound) to produce images of the heart. Images from an echocardiogram can provide important information about the size and shape of your heart, heart muscle function, heart valve function, and other possible heart problems. You  do not need to do anything to prepare before this test. You may eat and drink normally. After the echocardiogram is completed, you may return to your normal, everyday life, unless your health care provider tells you not to do that. This information is not intended to replace advice given to you by your health care provider. Make sure you discuss any questions you have with your health care provider. Document Revised: 09/17/2019 Document Reviewed: 09/17/2019 Elsevier Patient Education  2022 [employer].

## 2020-10-30 NOTE — Progress Notes (Signed)
[facility]  New Evaluation  Date:  10/30/2020   ID:  Crystal Bentley, DOB 08-03-1995, MRN [id number]  PCP:  Pcp, No   [facility] Providers Cardiologist:  Thomasene Ripple, DO  Electrophysiologist:  None       Referring MD: Marylen Ponto, NP   Chief Complaint: " I am having palpitations"  History of Present Illness:    Crystal Bentley is a 25 y.o. female [G1P0000] who is being seen today for the evaluation of palpitations at the request of [name], [name], NP.   The patient tells me that she has been experiencing intermittent palpitation.  She describes it as abrupt onset of fast heartbeat which goes on for seconds and sometimes many other time.  During this time she feels lightheaded or dizziness.  She admits to intermittent shortness of breath.  No chest pain.  Is in office today with her husband.  Prior CV Studies Reviewed: The following studies were reviewed today:   Past Medical History:  Diagnosis Date   Medical history non-contributory     Past Surgical History:  Procedure Laterality Date   INGUINAL HERNIA REPAIR     4 yrs    OVARIAN CYST REMOVAL     6 yrs       OB History     Gravida  1   Para  0   Term  0   Preterm  0   AB  0   Living  0      SAB  0   IAB  0   Ectopic  0   Multiple  0   Live Births  0               Current Medications: Current Meds  Medication Sig   famotidine (PEPCID) 20 MG tablet Take 1 tablet (20 mg total) by mouth 2 (two) times daily.   Prenatal MV & Min w/FA-DHA (PRENATAL GUMMIES PO) Take 1 tablet by mouth daily. Unknown strength   promethazine (PHENERGAN) 25 MG suppository Place 1 suppository (25 mg total) rectally every 6 (six) hours as needed for nausea or vomiting.     Allergies:   Patient has no known allergies.   Social History   Socioeconomic History   Marital status: Single    Spouse name: Not on file   Number of children: Not on file   Years of education: Not on file    Highest education level: Not on file  Occupational History   Not on file  Tobacco Use   Smoking status: Never   Smokeless tobacco: Never  Vaping Use   Vaping Use: Never used  Substance and Sexual Activity   Alcohol use: Never   Drug use: Never   Sexual activity: Yes    Birth control/protection: None  Other Topics Concern   Not on file  Social History Narrative   Not on file   Social Determinants of Health   Financial Resource Strain: Not on file  Food Insecurity: No Food Insecurity   Worried About Running Out of Food in the Last Year: Never true   Ran Out of Food in the Last Year: Never true  Transportation Needs: No Transportation Needs   Lack of Transportation (Medical): No   Lack of Transportation (Non-Medical): No  Physical Activity: Not on file  Stress: Not on file  Social Connections: Not on file      Family History  Problem Relation Age of Onset   Healthy Mother    Stroke  Father       ROS:   Please see the history of present illness.     Review of Systems  Constitution: Negative for decreased appetite, fever and weight gain.  HENT: Negative for congestion, ear discharge, hoarse voice and sore throat.   Eyes: Negative for discharge, redness, vision loss in right eye and visual halos.  Cardiovascular: Negative for chest pain, dyspnea on exertion, leg swelling, orthopnea and palpitations.  Respiratory: Negative for cough, hemoptysis, shortness of breath and snoring.   Endocrine: Negative for heat intolerance and polyphagia.  Hematologic/Lymphatic: Negative for bleeding problem. Does not bruise/bleed easily.  Skin: Negative for flushing, nail changes, rash and suspicious lesions.  Musculoskeletal: Negative for arthritis, joint pain, muscle cramps, myalgias, neck pain and stiffness.  Gastrointestinal: Negative for abdominal pain, bowel incontinence, diarrhea and excessive appetite.  Genitourinary: Negative for decreased libido, genital sores and incomplete  emptying.  Neurological: Negative for brief paralysis, focal weakness, headaches and loss of balance.  Psychiatric/Behavioral: Negative for altered mental status, depression and suicidal ideas.  Allergic/Immunologic: Negative for HIV exposure and persistent infections.     Labs/EKG Reviewed:    EKG:   EKG is was ordered today.  The ekg ordered today demonstrates sinus tachycardia  Recent Labs: 07/27/2020: ALT 18; BUN <5; Creatinine, Ser 0.48; Potassium 3.5; Sodium 133 10/14/2020: Hemoglobin 10.6; Platelets 343   Recent Lipid Panel No results found for: CHOL, TRIG, HDL, CHOLHDL, LDLCALC, LDLDIRECT  Physical Exam:    VS:  BP 111/77 (BP Location: Left Arm, Patient Position: Sitting)   Pulse 97   Ht 5\' 4"  (1.626 m)   Wt 121 lb 12.8 oz (55.2 kg)   LMP 04/09/2020   SpO2 99%   BMI 20.91 kg/m     Wt Readings from Last 3 Encounters:  10/30/20 121 lb 12.8 oz (55.2 kg)  10/28/20 121 lb 12.8 oz (55.2 kg)  10/14/20 120 lb 12.8 oz (54.8 kg)     GEN:  Well nourished, well developed in no acute distress HEENT: Normal NECK: No JVD; No carotid bruits LYMPHATICS: No lymphadenopathy CARDIAC: RRR, 2/3 holosystolic murmurs, rubs, gallops RESPIRATORY:  Clear to auscultation without rales, wheezing or rhonchi  ABDOMEN: Soft, non-tender, non-distended MUSCULOSKELETAL:  No edema; No deformity  SKIN: Warm and dry NEUROLOGIC:  Alert and oriented x 3 PSYCHIATRIC:  Normal affect    Risk Assessment/Risk Calculators:     CARPREG II Risk Prediction Index Score:  1.  The patient's risk for a primary cardiac event is 5%.            ASSESSMENT & PLAN:   Hypertension Dyspnea exertion  I would like to rule out a cardiovascular etiology of this palpitation, therefore at this time I would like to placed a zio patch for  7  days. In additon for her dyspnea on exertion a transthoracic echocardiogram will be ordered to assess LV/RV function and any structural abnormalities. Once these testing have  been performed amd reviewed further reccomendations will be made. For now, I do reccomend that the patient goes to the nearest ED if  symptoms recur.  Patient Instructions  Medication Instructions:  Your physician recommends that you continue on your current medications as directed. Please refer to the Current Medication list given to you today.  *If you need a refill on your cardiac medications before your next appointment, please call your pharmacy*   Lab Work: None If you have labs (blood work) drawn today and your tests are completely normal, you will receive  your results only by: MyChart Message (if you have MyChart) OR A paper copy in the mail If you have any lab test that is abnormal or we need to change your treatment, we will call you to review the results.   Testing/Procedures: Your physician has requested that you have an echocardiogram. Echocardiography is a painless test that uses sound waves to create images of your heart. It provides your doctor with information about the size and shape of your heart and how well your heart's chambers and valves are working. This procedure takes approximately one hour. There are no restrictions for this procedure.  A zio monitor was ordered today. It will remain on for 7 days. You will then return monitor and event diary in provided box. It takes 1-2 weeks for report to be downloaded and returned to [place]. We will call you with the results. If monitor falls off or has orange flashing light, please call Zio for further instructions.   ZIO  WHY IS MY DOCTOR PRESCRIBING ZIO? The Zio system is proven and trusted by physicians to detect and diagnose irregular heart rhythms -- and has been prescribed to hundreds of thousands of patients.  The FDA has cleared the Zio system to monitor for many different kinds of irregular heart rhythms. In a study, physicians were able to reach a diagnosis 90% of the time with the Zio system1.  You can wear the Zio  monitor -- a small, discreet, comfortable patch -- during your normal day-to-day activity, including while you sleep, shower, and exercise, while it records every single heartbeat for analysis.  1Barrett, P., et al. Comparison of 24 Hour Holter Monitoring Versus 14 Day Novel Adhesive Patch Electrocardiographic Monitoring. American Journal of Medicine, 2014.  ZIO VS. HOLTER MONITORING The Zio monitor can be comfortably worn for up to 14 days. Holter monitors can be worn for 24 to 48 hours, limiting the time to record any irregular heart rhythms you may have. Zio is able to capture data for the 51% of patients who have their first symptom-triggered arrhythmia after 48 hours.1  LIVE WITHOUT RESTRICTIONS The Zio ambulatory cardiac monitor is a small, unobtrusive, and water-resistant patch--you might even forget you're wearing it. The Zio monitor records and stores every beat of your heart, whether you're sleeping, working out, or showering. Remove on: Oct 7th 2022   Follow-Up: At [facility], you and your health needs are our priority.  As part of our continuing mission to provide you with exceptional heart care, we have created designated Provider Care Teams.  These Care Teams include your primary Cardiologist (physician) and Advanced Practice Providers (APPs -  Physician Assistants and Nurse Practitioners) who all work together to provide you with the care you need, when you need it.  We recommend signing up for the patient portal called "MyChart".  Sign up information is provided on this After Visit Summary.  MyChart is used to connect with patients for Virtual Visits (Telemedicine).  Patients are able to view lab/test results, encounter notes, upcoming appointments, etc.  Non-urgent messages can be sent to your provider as well.   To learn more about what you can do with MyChart, go to [email or web address].    Your next appointment:   As needed  The format for your next appointment:   In  Person  Provider:   Thomasene Ripple, DO   Other Instructions Echocardiogram An echocardiogram is a test that uses sound waves (ultrasound) to produce images of the heart. Images from an echocardiogram  can provide important information about: Heart size and shape. The size and thickness and movement of your heart's walls. Heart muscle function and strength. Heart valve function or if you have stenosis. Stenosis is when the heart valves are too narrow. If blood is flowing backward through the heart valves (regurgitation). A tumor or infectious growth around the heart valves. Areas of heart muscle that are not working well because of poor blood flow or injury from a heart attack. Aneurysm detection. An aneurysm is a weak or damaged part of an artery wall. The wall bulges out from the normal force of blood pumping through the body. Tell a health care provider about: Any allergies you have. All medicines you are taking, including vitamins, herbs, eye drops, creams, and over-the-counter medicines. Any blood disorders you have. Any surgeries you have had. Any medical conditions you have. Whether you are pregnant or may be pregnant. What are the risks? Generally, this is a safe test. However, problems may occur, including an allergic reaction to dye (contrast) that may be used during the test. What happens before the test? No specific preparation is needed. You may eat and drink normally. What happens during the test?  You will take off your clothes from the waist up and put on a hospital gown. Electrodes or electrocardiogram (ECG)patches may be placed on your chest. The electrodes or patches are then connected to a device that monitors your heart rate and rhythm. You will lie down on a table for an ultrasound exam. A gel will be applied to your chest to help sound waves pass through your skin. A handheld device, called a transducer, will be pressed against your chest and moved over your heart.  The transducer produces sound waves that travel to your heart and bounce back (or "echo" back) to the transducer. These sound waves will be captured in real-time and changed into images of your heart that can be viewed on a video monitor. The images will be recorded on a computer and reviewed by your health care provider. You may be asked to change positions or hold your breath for a short time. This makes it easier to get different views or better views of your heart. In some cases, you may receive contrast through an IV in one of your veins. This can improve the quality of the pictures from your heart. The procedure may vary among health care providers and hospitals. What can I expect after the test? You may return to your normal, everyday life, including diet, activities, and medicines, unless your health care provider tells you not to do that. Follow these instructions at home: It is up to you to get the results of your test. Ask your health care provider, or the department that is doing the test, when your results will be ready. Keep all follow-up visits. This is important. Summary An echocardiogram is a test that uses sound waves (ultrasound) to produce images of the heart. Images from an echocardiogram can provide important information about the size and shape of your heart, heart muscle function, heart valve function, and other possible heart problems. You do not need to do anything to prepare before this test. You may eat and drink normally. After the echocardiogram is completed, you may return to your normal, everyday life, unless your health care provider tells you not to do that. This information is not intended to replace advice given to you by your health care provider. Make sure you discuss any questions you have with  your health care provider. Document Revised: 09/17/2019 Document Reviewed: 09/17/2019 Elsevier Patient Education  2022 [employer]     Dispo:  No follow-ups on  file.   Medication Adjustments/Labs and Tests Ordered: Current medicines are reviewed at length with the patient today.  Concerns regarding medicines are outlined above.  Tests Ordered: Orders Placed This Encounter  Procedures   LONG TERM MONITOR (3-14 DAYS)   EKG 12-Lead   ECHOCARDIOGRAM COMPLETE   Medication Changes: No orders of the defined types were placed in this encounter.

## 2020-10-31 ENCOUNTER — Encounter: Payer: Self-pay | Admitting: Women's Health

## 2020-10-31 DIAGNOSIS — R002 Palpitations: Secondary | ICD-10-CM | POA: Insufficient documentation

## 2020-11-11 ENCOUNTER — Other Ambulatory Visit: Payer: Self-pay

## 2020-11-11 ENCOUNTER — Ambulatory Visit (INDEPENDENT_AMBULATORY_CARE_PROVIDER_SITE_OTHER): Payer: BC Managed Care – PPO | Admitting: Women's Health

## 2020-11-11 VITALS — BP 117/82 | HR 101 | Wt 122.0 lb

## 2020-11-11 DIAGNOSIS — Z3480 Encounter for supervision of other normal pregnancy, unspecified trimester: Secondary | ICD-10-CM

## 2020-11-11 DIAGNOSIS — Z3A31 31 weeks gestation of pregnancy: Secondary | ICD-10-CM

## 2020-11-11 DIAGNOSIS — O219 Vomiting of pregnancy, unspecified: Secondary | ICD-10-CM

## 2020-11-11 MED ORDER — PROMETHAZINE HCL 25 MG RE SUPP: 25.0000 mg | Freq: Four times a day (QID) | RECTAL | 1 refills | Status: DC | PRN

## 2020-11-11 NOTE — Progress Notes (Signed)
Pt states having back/leg pain.  Needs refill on Phenergan.

## 2020-11-11 NOTE — Patient Instructions (Signed)
Maternity Assessment Unit (MAU)  The Maternity Assessment Unit (MAU) is located at the [facility] at [facility]. The address is: [address], [place], [place], [place] [address]. Please see map below for additional directions.    The Maternity Assessment Unit is designed to help you during your pregnancy, and for up to 6 weeks after delivery, with any pregnancy- or postpartum-related emergencies, if you think you are in labor, or if your water has broken. For example, if you experience nausea and vomiting, vaginal bleeding, severe abdominal or pelvic pain, elevated blood pressure or other problems related to your pregnancy or postpartum time, please come to the Maternity Assessment Unit for assistance.       Contraception Choices - www.bedisder.org Contraception, also called birth control, refers to methods or devices that prevent pregnancy. Hormonal methods Contraceptive implant A contraceptive implant is a thin, plastic tube that contains a hormone that prevents pregnancy. It is different from an intrauterine device (IUD). It is inserted into the upper part of the arm by a health care provider. Implants can be effective for up to 3 years. Progestin-only injections Progestin-only injections are injections of progestin, a synthetic form of the hormone progesterone. They are given every 3 months by a health care provider. Birth control pills Birth control pills are pills that contain hormones that prevent pregnancy. They must be taken once a day, preferably at the same time each day. A prescription is needed to use this method of contraception. Birth control patch The birth control patch contains hormones that prevent pregnancy. It is placed on the skin and must be changed once a week for three weeks and removed on the fourth week. A prescription is needed to use this method of contraception. Vaginal ring A vaginal ring contains hormones that prevent  pregnancy. It is placed in the vagina for three weeks and removed on the fourth week. After that, the process is repeated with a new ring. A prescription is needed to use this method of contraception. Emergency contraceptive Emergency contraceptives prevent pregnancy after unprotected sex. They come in pill form and can be taken up to 5 days after sex. They work best the sooner they are taken after having sex. Most emergency contraceptives are available without a prescription. This method should not be used as your only form of birth control. Barrier methods Female condom A female condom is a thin sheath that is worn over the penis during sex. Condoms keep sperm from going inside a woman's body. They can be used with a sperm-killing substance (spermicide) to increase their effectiveness. They should be thrown away after one use. Female condom A female condom is a soft, loose-fitting sheath that is put into the vagina before sex. The condom keeps sperm from going inside a woman's body. They should be thrown away after one use. Diaphragm A diaphragm is a soft, dome-shaped barrier. It is inserted into the vagina before sex, along with a spermicide. The diaphragm blocks sperm from entering the uterus, and the spermicide kills sperm. A diaphragm should be left in the vagina for 6-8 hours after sex and removed within 24 hours. A diaphragm is prescribed and fitted by a health care provider. A diaphragm should be replaced every 1-2 years, after giving birth, after gaining more than 15 lb (6.8 kg), and after pelvic surgery. Cervical cap A cervical cap is a round, soft latex or plastic cup that fits over the cervix. It is inserted into the vagina before sex,  along with spermicide. It blocks sperm from entering the uterus. The cap should be left in place for 6-8 hours after sex and removed within 48 hours. A cervical cap must be prescribed and fitted by a health care provider. It should be replaced every 2  years. Sponge A sponge is a soft, circular piece of polyurethane foam with spermicide in it. The sponge helps block sperm from entering the uterus, and the spermicide kills sperm. To use it, you make it wet and then insert it into the vagina. It should be inserted before sex, left in for at least 6 hours after sex, and removed and thrown away within 30 hours. Spermicides Spermicides are chemicals that kill or block sperm from entering the cervix and uterus. They can come as a cream, jelly, suppository, foam, or tablet. A spermicide should be inserted into the vagina with an applicator at least 40-98 minutes before sex to allow time for it to work. The process must be repeated every time you have sex. Spermicides do not require a prescription. Intrauterine contraception Intrauterine device (IUD) An IUD is a T-shaped device that is put in a woman's uterus. There are two types: Hormone IUD.This type contains progestin, a synthetic form of the hormone progesterone. This type can stay in place for 3-5 years. Copper IUD.This type is wrapped in copper wire. It can stay in place for 10 years. Permanent methods of contraception Female tubal ligation In this method, a woman's fallopian tubes are sealed, tied, or blocked during surgery to prevent eggs from traveling to the uterus. Hysteroscopic sterilization In this method, a small, flexible insert is placed into each fallopian tube. The inserts cause scar tissue to form in the fallopian tubes and block them, so sperm cannot reach an egg. The procedure takes about 3 months to be effective. Another form of birth control must be used during those 3 months. Female sterilization This is a procedure to tie off the tubes that carry sperm (vasectomy). After the procedure, the man can still ejaculate fluid (semen). Another form of birth control must be used for 3 months after the procedure. Natural planning methods Natural family planning In this method, a couple does  not have sex on days when the woman could become pregnant. Calendar method In this method, the woman keeps track of the length of each menstrual cycle, identifies the days when pregnancy can happen, and does not have sex on those days. Ovulation method In this method, a couple avoids sex during ovulation. Symptothermal method This method involves not having sex during ovulation. The woman typically checks for ovulation by watching changes in her temperature and in the consistency of cervical mucus. Post-ovulation method In this method, a couple waits to have sex until after ovulation. Where to find more information Centers for Disease Control and Prevention: http://www.wolf.info/ Summary Contraception, also called birth control, refers to methods or devices that prevent pregnancy. Hormonal methods of contraception include implants, injections, pills, patches, vaginal rings, and emergency contraceptives. Barrier methods of contraception can include female condoms, female condoms, diaphragms, cervical caps, sponges, and spermicides. There are two types of IUDs (intrauterine devices). An IUD can be put in a woman's uterus to prevent pregnancy for 3-5 years. Permanent sterilization can be done through a procedure for males and females. Natural family planning methods involve nothaving sex on days when the woman could become pregnant. This information is not intended to replace advice given to you by your health care provider. Make sure you discuss any questions  you have with your health care provider. Document Revised: 07/01/2019 Document Reviewed: 07/01/2019 Elsevier Patient Education  2022 Elsevier Inc.       Preterm Labor The normal length of a pregnancy is 39-41 weeks. Preterm labor is when labor starts before 37 completed weeks of pregnancy. Babies who are born prematurely and survive may not be fully developed and may be at an increased risk for long-term problems such as cerebral palsy,  developmental delays, and vision and hearing problems. Babies who are born too early may have problems soon after birth. Premature babies may have problems regulating blood sugar, body temperature, heart rate, and breathing rate. These babies often have trouble with feeding. The risk of having problems is highest for babies who are born before 34 weeks of pregnancy. What are the causes? The exact cause of this condition is not known. What increases the risk? You are more likely to have preterm labor if you have certain risk factors that relate to your medical history, problems with present and past pregnancies, and lifestyle factors. Medical history You have abnormalities of the uterus, including a short cervix. You have STIs (sexually transmitted infections) or other infections of the urinary tract and the vagina. You have chronic illnesses, such as blood clotting problems, diabetes, or high blood pressure. You are overweight or underweight. Present and past pregnancies You have had preterm labor before. You are pregnant with twins or other multiples. You have been diagnosed with a condition in which the placenta covers your cervix (placenta previa). You waited less than 18 months between giving birth and becoming pregnant again. Your unborn baby has some abnormalities. You have vaginal bleeding during pregnancy. You became pregnant through in vitro fertilization (IVF). Lifestyle and environmental factors You use tobacco products or drink alcohol. You use drugs. You have stress and no social support. You experience domestic violence. You are exposed to certain chemicals or environmental pollutants. Other factors You are younger than age 83 or older than age 38. What are the signs or symptoms? Symptoms of this condition include: Cramps similar to those that can happen during a menstrual period. The cramps may happen with diarrhea. Pain in the abdomen or lower back. Regular contractions  that may feel like tightening of the abdomen. A feeling of increased pressure in the pelvis. Increased watery or bloody mucus discharge from the vagina. Water breaking (ruptured amniotic sac). How is this diagnosed? This condition is diagnosed based on: Your medical history and a physical exam. A pelvic exam. An ultrasound. Monitoring your uterus for contractions. Other tests, including: A swab of the cervix to check for a chemical called fetal fibronectin. Urine tests. How is this treated? Treatment for this condition depends on the length of your pregnancy, your condition, and the health of your baby. Treatment may include: Taking medicines, such as: Hormone medicines. These may be given early in pregnancy to help support the pregnancy. Medicines to stop contractions. Medicines to help mature the baby's lungs. These may be prescribed if the risk of delivery is high. Medicines to help protect your baby from brain and nerve complications such as cerebral palsy. Bed rest. If the labor happens before 34 weeks of pregnancy, you may need to stay in the hospital. Delivery of the baby. Follow these instructions at home:  Do not use any products that contain nicotine or tobacco. These products include cigarettes, chewing tobacco, and vaping devices, such as e-cigarettes. If you need help quitting, ask your health care provider. Do not drink alcohol.  Take over-the-counter and prescription medicines only as told by your health care provider. Rest as told by your health care provider. Return to your normal activities as told by your health care provider. Ask your health care provider what activities are safe for you. Keep all follow-up visits. This is important. How is this prevented? To increase your chance of having a full-term pregnancy: Do not use drugs or take medicines that have not been prescribed to you during your pregnancy. Talk with your health care provider before taking any herbal  supplements, even if you have been taking them regularly. Make sure you gain a healthy amount of weight during your pregnancy. Watch for infection. If you think that you might have an infection, get it checked right away. Symptoms of infection may include: Fever. Abnormal vaginal discharge or discharge that smells bad. Pain or burning with urination. Needing to urinate urgently. Frequently urinating or passing small amounts of urine frequently. Blood in your urine or urine that smells bad or unusual. Where to find more information U.S. Department of Health and Cytogeneticist on Women's Health: http://hoffman.com/ [employer]stetricians and [employer]necologists: www.acog.org Centers for Disease Control and Prevention, Preterm Birth: FootballExhibition.com.br Contact a health care provider if: You think you are going into preterm labor. You have signs or symptoms of preterm labor. You have symptoms of infection. Get help right away if: You are having regular, painful contractions every 5 minutes or less. Your water breaks. Summary Preterm labor is labor that starts before you reach 37 weeks of pregnancy. Delivering your baby early increases your baby's risk of developing long-term problems. You are more likely to have preterm labor if you have certain risk factors that relate to your medical history, problems with present and past pregnancies, and lifestyle factors. Keep all follow-up visits. This is important. Contact a health care provider if you have signs or symptoms of preterm labor. This information is not intended to replace advice given to you by your health care provider. Make sure you discuss any questions you have with your health care provider. Document Revised: 01/28/2020 Document Reviewed: 01/28/2020 Elsevier Patient Education  2022 [employer].

## 2020-11-11 NOTE — Progress Notes (Signed)
Subjective:  Crystal Bentley is a 25 y.o. G1P0000 at [redacted]w[redacted]d being seen today for ongoing prenatal care.  She is currently monitored for the following issues for this low-risk pregnancy and has Supervision of other normal pregnancy, antepartum; Sickle cell trait (HCC); Alpha thalassemia silent carrier; No blood products; Nausea and vomiting during pregnancy; Back pain in pregnancy; and Palpitations on their problem list.  Patient reports no complaints.  Contractions: Not present. Vag. Bleeding: None.  Movement: Present. Denies leaking of fluid.   The following portions of the patient's history were reviewed and updated as appropriate: allergies, current medications, past family history, past medical history, past social history, past surgical history and problem list. Problem list updated.  Objective:   Vitals:   11/11/20 1603  BP: 117/82  Pulse: (!) 101  Weight: 122 lb (55.3 kg)    Fetal Status: Fetal Heart Rate (bpm): 138 (Simultaneous filing. User may not have seen previous data.) Fundal Height: 31 cm Movement: Present     General:  Alert, oriented and cooperative. Patient is in no acute distress.  Skin: Skin is warm and dry. No rash noted.   Cardiovascular: Normal heart rate noted  Respiratory: Normal respiratory effort, no problems with respiration noted  Abdomen: Soft, gravid, appropriate for gestational age. Pain/Pressure: Present     Pelvic: Vag. Bleeding: None     Cervical exam deferred        Extremities: Normal range of motion.  Edema: None  Mental Status: Normal mood and affect. Normal behavior. Normal judgment and thought content.   Urinalysis:      Assessment and Plan:  Pregnancy: G1P0000 at [redacted]w[redacted]d  1. Supervision of other normal pregnancy, antepartum -contraception info given  PHQ9 SCORE ONLY 10/14/2020 06/30/2020  PHQ-9 Total Score 4 0   GAD 7 : Generalized Anxiety Score 10/14/2020  Nervous, Anxious, on Edge 0  Control/stop worrying 0  Worry too much - different  things 0  Trouble relaxing 1  Restless 1  Easily annoyed or irritable 0  Afraid - awful might happen 0  Total GAD 7 Score 2   2. [redacted] weeks gestation of pregnancy  Preterm labor symptoms and general obstetric precautions including but not limited to vaginal bleeding, contractions, leaking of fluid and fetal movement were reviewed in detail with the patient. I discussed the assessment and treatment plan with the patient. The patient was provided an opportunity to ask questions and all were answered. The patient agreed with the plan and demonstrated an understanding of the instructions. The patient was advised to call back or seek an in-person office evaluation/go to MAU at [facility] for any urgent or concerning symptoms. Please refer to After Visit Summary for other counseling recommendations.  Return in about 2 weeks (around 11/25/2020) for in-person LOB/APP OK.   Mikhayla Phillis, [name], NP

## 2020-11-17 ENCOUNTER — Ambulatory Visit (HOSPITAL_COMMUNITY): Payer: BC Managed Care – PPO

## 2020-11-21 ENCOUNTER — Encounter (HOSPITAL_COMMUNITY): Payer: Self-pay | Admitting: Obstetrics & Gynecology

## 2020-11-21 ENCOUNTER — Inpatient Hospital Stay (HOSPITAL_COMMUNITY)
Admission: AD | Admit: 2020-11-21 | Discharge: 2020-11-21 | Disposition: A | Discharge status: Home / Self Care | Payer: BC Managed Care – PPO | Attending: Obstetrics & Gynecology | Admitting: Obstetrics & Gynecology

## 2020-11-21 ENCOUNTER — Other Ambulatory Visit: Payer: Self-pay

## 2020-11-21 DIAGNOSIS — Z3689 Encounter for other specified antenatal screening: Secondary | ICD-10-CM

## 2020-11-21 DIAGNOSIS — O99891 Other specified diseases and conditions complicating pregnancy: Secondary | ICD-10-CM | POA: Diagnosis not present

## 2020-11-21 DIAGNOSIS — Z3A32 32 weeks gestation of pregnancy: Secondary | ICD-10-CM | POA: Diagnosis not present

## 2020-11-21 DIAGNOSIS — R109 Unspecified abdominal pain: Secondary | ICD-10-CM | POA: Diagnosis present

## 2020-11-21 DIAGNOSIS — O26893 Other specified pregnancy related conditions, third trimester: Secondary | ICD-10-CM | POA: Insufficient documentation

## 2020-11-21 DIAGNOSIS — R1030 Lower abdominal pain, unspecified: Secondary | ICD-10-CM | POA: Diagnosis not present

## 2020-11-21 DIAGNOSIS — B3731 Acute candidiasis of vulva and vagina: Secondary | ICD-10-CM

## 2020-11-21 LAB — URINALYSIS, ROUTINE W REFLEX MICROSCOPIC
Bilirubin Urine: NEGATIVE
Glucose, UA: NEGATIVE mg/dL
Hgb urine dipstick: NEGATIVE
Ketones, ur: 20 mg/dL — AB
Nitrite: NEGATIVE
Protein, ur: NEGATIVE mg/dL
Specific Gravity, Urine: 1.01 (ref 1.005–1.030)
pH: 6 (ref 5.0–8.0)

## 2020-11-21 LAB — WET PREP, GENITAL
Sperm: NONE SEEN
Trich, Wet Prep: NONE SEEN

## 2020-11-21 MED ORDER — ACETAMINOPHEN 650 MG RE SUPP: 650.0000 mg | Freq: Four times a day (QID) | RECTAL | 0 refills | Status: DC | PRN

## 2020-11-21 MED ORDER — TERCONAZOLE 0.4 % VA CREA: 1.0000 | TOPICAL_CREAM | Freq: Every day | VAGINAL | 0 refills | Status: DC

## 2020-11-21 MED ORDER — ACETAMINOPHEN 650 MG RE SUPP
650.0000 mg | Freq: Once | RECTAL | Status: AC
  Administered 2020-11-21: 650 mg via RECTAL
  Filled 2020-11-21: qty 1

## 2020-11-21 NOTE — MAU Note (Signed)
Abdominal pain/craming since yesterday, got stronger X an hour ago. Denies bleeding or leaking of fluid. Endorses positive FM

## 2020-11-21 NOTE — MAU Provider Note (Signed)
History     CSN: [id number]  Arrival date and time: 11/21/20 0506   Event Date/Time   First Provider Initiated Contact with Patient 11/21/20 0556      Chief Complaint  Patient presents with   Abdominal Pain   Crystal Bentley is a 25 y.o. G1P0 at [redacted]w[redacted]d who receives care at [facility].  She presents today for Abdominal Pain.  Patient reports pain started this morning at 430 in the lower abdominal pain.  She describes the pain as a aching currently, but states yesterday it felt more like cramping.  She rates the pain a 7/10 and states movement makes it worse.  She states she has not found any relieving factors and is unable to take medication due to severe nausea and vomiting.  She endorses fetal movement and denies vaginal bleeding, leaking, or odor.   OB History     Gravida  1   Para  0   Term  0   Preterm  0   AB  0   Living  0      SAB  0   IAB  0   Ectopic  0   Multiple  0   Live Births  0           Past Medical History:  Diagnosis Date   Medical history non-contributory     Past Surgical History:  Procedure Laterality Date   INGUINAL HERNIA REPAIR     4 yrs    OVARIAN CYST REMOVAL     6 yrs     Family History  Problem Relation Age of Onset   Healthy Mother    Stroke Father     Social History   Tobacco Use   Smoking status: Never   Smokeless tobacco: Never  Vaping Use   Vaping Use: Never used  Substance Use Topics   Alcohol use: Never   Drug use: Never    Allergies: No Known Allergies  Medications Prior to Admission  Medication Sig Dispense Refill Last Dose   famotidine (PEPCID) 20 MG tablet Take 1 tablet (20 mg total) by mouth 2 (two) times daily. 30 tablet 0    Prenatal MV & Min w/FA-DHA (PRENATAL GUMMIES PO) Take 1 tablet by mouth daily. Unknown strength      promethazine (PHENERGAN) 25 MG suppository Place 1 suppository (25 mg total) rectally every 6 (six) hours as needed for nausea or vomiting. 12 suppository 3     promethazine (PHENERGAN) 25 MG suppository Place 1 suppository (25 mg total) rectally every 6 (six) hours as needed for nausea or vomiting. 30 each 1     Review of Systems  Gastrointestinal:  Positive for abdominal pain, nausea and vomiting.  Genitourinary:  Negative for difficulty urinating, dysuria, vaginal bleeding and vaginal discharge.  Musculoskeletal:  Negative for back pain.  Neurological:  Negative for dizziness, numbness and headaches.  Physical Exam   Weight 55.8 kg, last menstrual period 04/09/2020.  Physical Exam Vitals reviewed.  Constitutional:      Appearance: She is well-developed.  HENT:     Head: Normocephalic and atraumatic.  Eyes:     Conjunctiva/sclera: Conjunctivae normal.  Cardiovascular:     Rate and Rhythm: Normal rate and regular rhythm.     Heart sounds: Normal heart sounds.  Pulmonary:     Effort: Pulmonary effort is normal. No respiratory distress.  Musculoskeletal:        General: Normal range of motion.     Cervical back: Normal range of  motion.     Right lower leg: No edema.     Left lower leg: No edema.  Skin:    General: Skin is warm and dry.  Neurological:     Mental Status: She is alert and oriented to person, place, and time.  Psychiatric:        Mood and Affect: Mood normal.        Behavior: Behavior normal.        Thought Content: Thought content normal.    Fetal Assessment 125 bpm, Mod Var, -Decels, +Accels Toco: No ctx graphed  MAU Course   Results for orders placed or performed during the hospital encounter of 11/21/20 (from the past 24 hour(s))  Urinalysis, Routine w reflex microscopic     Status: Abnormal   Collection Time: 11/21/20  5:30 AM  Result Value Ref Range   Color, Urine YELLOW YELLOW   APPearance CLOUDY (A) CLEAR   Specific Gravity, Urine 1.010 1.005 - 1.030   pH 6.0 5.0 - 8.0   Glucose, UA NEGATIVE NEGATIVE mg/dL   Hgb urine dipstick NEGATIVE NEGATIVE   Bilirubin Urine NEGATIVE NEGATIVE   Ketones, ur 20  (A) NEGATIVE mg/dL   Protein, ur NEGATIVE NEGATIVE mg/dL   Nitrite NEGATIVE NEGATIVE   Leukocytes,Ua LARGE (A) NEGATIVE   RBC / HPF 0-5 0 - 5 RBC/hpf   WBC, UA 21-50 0 - 5 WBC/hpf   Bacteria, UA MANY (A) NONE SEEN   Squamous Epithelial / LPF 11-20 0 - 5   Mucus PRESENT   Wet prep, genital     Status: Abnormal   Collection Time: 11/21/20  6:07 AM  Result Value Ref Range   Yeast Wet Prep HPF POC PRESENT (A) NONE SEEN   Trich, Wet Prep NONE SEEN NONE SEEN   Clue Cells Wet Prep HPF POC PRESENT (A) NONE SEEN   WBC, Wet Prep HPF POC MANY (A) NONE SEEN   Sperm NONE SEEN    No results found.  MDM PE Labs: UA, Wet Prep, GC/CT EFM Pain Medication-Suppository Assessment and Plan  25 year old G1P0  SIUP at 32.6 weeks Cat I FT Abdominal Pain  -POC Reviewed. -Exam performed and findings discussed. -Cultures collected and pending.  -Discussed treatment for pain and patient agreeable, but states she can not take medication orally. -Patient offered and accepts Tylenol suppository. -NST Reactive. -Will monitor and await results.    Cherre Robins MSN, CNM 11/21/2020, 5:56 AM    Reassessment (7:05 AM)  -Patient reports improvement with tylenol dosing. -No questions or concerns. -NST remains reactive. -Instructed to followup as scheduled. -Encouraged to call primary office or return to MAU if symptoms worsen or with the onset of new symptoms. -Discharged to home in improved condition.  Cherre Robins MSN, CNM Advanced Practice Provider, [facility] [employer] [facility]

## 2020-11-22 LAB — CULTURE, OB URINE

## 2020-11-23 LAB — GC/CHLAMYDIA PROBE AMP (~~LOC~~) NOT AT ARMC
Chlamydia: NEGATIVE
Comment: NEGATIVE
Comment: NORMAL
Neisseria Gonorrhea: NEGATIVE

## 2020-11-25 ENCOUNTER — Other Ambulatory Visit: Payer: Self-pay

## 2020-11-25 ENCOUNTER — Encounter: Payer: Self-pay | Admitting: Obstetrics

## 2020-11-25 ENCOUNTER — Ambulatory Visit (INDEPENDENT_AMBULATORY_CARE_PROVIDER_SITE_OTHER): Payer: BC Managed Care – PPO | Admitting: Obstetrics

## 2020-11-25 VITALS — BP 116/79 | HR 109 | Wt 124.6 lb

## 2020-11-25 DIAGNOSIS — O219 Vomiting of pregnancy, unspecified: Secondary | ICD-10-CM

## 2020-11-25 DIAGNOSIS — Z3480 Encounter for supervision of other normal pregnancy, unspecified trimester: Secondary | ICD-10-CM

## 2020-11-25 MED ORDER — PROMETHAZINE HCL 25 MG RE SUPP: 25.0000 mg | Freq: Four times a day (QID) | RECTAL | 3 refills | Status: DC | PRN

## 2020-11-25 NOTE — Progress Notes (Signed)
Pt presents for ROB c/o N&V no relief with Phenergan.

## 2020-11-26 ENCOUNTER — Encounter: Payer: Self-pay | Admitting: Obstetrics

## 2020-11-26 NOTE — Progress Notes (Signed)
Subjective:  Crystal Bentley is a 25 y.o. G1P0000 at [redacted]w[redacted]d being seen today for ongoing prenatal care.  She is currently monitored for the following issues for this low-risk pregnancy and has Supervision of other normal pregnancy, antepartum; Sickle cell trait (HCC); Alpha thalassemia silent carrier; No blood products; Nausea and vomiting during pregnancy; Back pain in pregnancy; and Palpitations on their problem list.  Patient reports nausea and vomiting.  Contractions: Not present. Vag. Bleeding: None.  Movement: Present. Denies leaking of fluid.   The following portions of the patient's history were reviewed and updated as appropriate: allergies, current medications, past family history, past medical history, past social history, past surgical history and problem list. Problem list updated.  Objective:   Vitals:   11/25/20 1630  BP: 116/79  Pulse: (!) 109  Weight: 124 lb 9.6 oz (56.5 kg)    Fetal Status:     Movement: Present     General:  Alert, oriented and cooperative. Patient is in no acute distress.  Skin: Skin is warm and dry. No rash noted.   Cardiovascular: Normal heart rate noted  Respiratory: Normal respiratory effort, no problems with respiration noted  Abdomen: Soft, gravid, appropriate for gestational age. Pain/Pressure: Present     Pelvic:  Cervical exam deferred        Extremities: Normal range of motion.  Edema: None  Mental Status: Normal mood and affect. Normal behavior. Normal judgment and thought content.   Urinalysis:      Assessment and Plan:  Pregnancy: G1P0000 at [redacted]w[redacted]d  1. Supervision of other normal pregnancy, antepartum Rx: - [place] MFM OB FOLLOW UP; Future  2. Nausea and vomiting during pregnancy Rx: - promethazine (PHENERGAN) 25 MG suppository; Place 1 suppository (25 mg total) rectally every 6 (six) hours as needed for nausea or vomiting.  Dispense: 30 suppository; Refill: 3   Preterm labor symptoms and general obstetric precautions including  but not limited to vaginal bleeding, contractions, leaking of fluid and fetal movement were reviewed in detail with the patient. Please refer to After Visit Summary for other counseling recommendations.   Return in about 2 weeks (around 12/09/2020) for ROB.   Brock Bad, MD  11/26/20

## 2020-12-01 ENCOUNTER — Other Ambulatory Visit: Payer: Self-pay

## 2020-12-01 ENCOUNTER — Ambulatory Visit (HOSPITAL_COMMUNITY): Payer: BC Managed Care – PPO | Attending: Internal Medicine

## 2020-12-01 DIAGNOSIS — R0602 Shortness of breath: Secondary | ICD-10-CM

## 2020-12-01 LAB — ECHOCARDIOGRAM COMPLETE
Area-P 1/2: 4.49 cm2
S' Lateral: 2.5 cm

## 2020-12-05 ENCOUNTER — Encounter (HOSPITAL_COMMUNITY): Payer: Self-pay | Admitting: Obstetrics & Gynecology

## 2020-12-05 ENCOUNTER — Other Ambulatory Visit: Payer: Self-pay

## 2020-12-05 ENCOUNTER — Inpatient Hospital Stay (HOSPITAL_COMMUNITY)
Admission: AD | Admit: 2020-12-05 | Discharge: 2020-12-05 | Disposition: A | Discharge status: Home / Self Care | Payer: BC Managed Care – PPO | Attending: Obstetrics & Gynecology | Admitting: Obstetrics & Gynecology

## 2020-12-05 DIAGNOSIS — O368130 Decreased fetal movements, third trimester, not applicable or unspecified: Secondary | ICD-10-CM | POA: Insufficient documentation

## 2020-12-05 DIAGNOSIS — Z3A34 34 weeks gestation of pregnancy: Secondary | ICD-10-CM | POA: Diagnosis not present

## 2020-12-05 DIAGNOSIS — O212 Late vomiting of pregnancy: Secondary | ICD-10-CM | POA: Diagnosis not present

## 2020-12-05 DIAGNOSIS — Z3689 Encounter for other specified antenatal screening: Secondary | ICD-10-CM

## 2020-12-05 DIAGNOSIS — O219 Vomiting of pregnancy, unspecified: Secondary | ICD-10-CM

## 2020-12-05 DIAGNOSIS — Z3480 Encounter for supervision of other normal pregnancy, unspecified trimester: Secondary | ICD-10-CM

## 2020-12-05 MED ORDER — PROMETHAZINE HCL 25 MG RE SUPP: 25.0000 mg | Freq: Four times a day (QID) | RECTAL | 3 refills | Status: DC | PRN

## 2020-12-05 NOTE — MAU Provider Note (Signed)
Chief Complaint:  Decreased Fetal Movement    HPI: Crystal Bentley is a 25 y.o. G1P0000 at [redacted]w[redacted]d who presents to maternity admissions reporting decreased fetal movement. Patient reports that prior to her arrival in MAU she had not felt baby move since 8pm. Since her arrival she has felt the baby move several times. She denies contractions, leaking fluid, or vaginal bleeding.   Pregnancy Course:   Past Medical History:  Diagnosis Date   Medical history non-contributory    OB History  Gravida Para Term Preterm AB Living  1 0 0 0 0 0  SAB IAB Ectopic Multiple Live Births  0 0 0 0 0    # Outcome Date GA Lbr Len/2nd Weight Sex Delivery Anes PTL Lv  1 Current            Past Surgical History:  Procedure Laterality Date   INGUINAL HERNIA REPAIR     4 yrs    OVARIAN CYST REMOVAL     6 yrs    Family History  Problem Relation Age of Onset   Healthy Mother    Stroke Father    Social History   Tobacco Use   Smoking status: Never   Smokeless tobacco: Never  Vaping Use   Vaping Use: Never used  Substance Use Topics   Alcohol use: Never   Drug use: Never   No Known Allergies No medications prior to admission.    I have reviewed patient's Past Medical Hx, Surgical Hx, Family Hx, Social Hx, medications and allergies.   ROS:  Review of Systems  Constitutional: Negative.   Respiratory: Negative.    Cardiovascular: Negative.   Gastrointestinal: Negative.   Genitourinary: Negative.   Musculoskeletal: Negative.   Neurological: Negative.   Psychiatric/Behavioral: Negative.     Physical Exam  Patient Vitals for the past 24 hrs:  BP Temp Temp src Pulse Resp SpO2 Height Weight  12/05/20 0516 118/83 -- -- 90 -- 99 % -- --  12/05/20 0336 113/72 98.3 F (36.8 C) Oral 93 20 -- 5\' 5"  (1.651 m) 57.4 kg   Constitutional: well-developed, well-nourished female in no acute distress.  Cardiovascular: normal rate Respiratory: normal effort GI: abd soft, non-tender, gravid  MS:  extremities nontender, no edema, normal ROM Neurologic: alert and oriented x 4.  Pelvic: not indicated     FHT: Baseline 125 bpm, moderate variability, 15x15 accelerations present, no decelerations Toco: quiet   Labs: No results found for this or any previous visit (from the past 24 hour(s)).  Imaging:    MAU Course: Orders Placed This Encounter  Procedures   Discharge patient   Meds ordered this encounter  Medications   promethazine (PHENERGAN) 25 MG suppository    Sig: Place 1 suppository (25 mg total) rectally every 6 (six) hours as needed for nausea or vomiting.    Dispense:  30 suppository    Refill:  3    Order Specific Question:   Supervising Provider    Answer:      MDM: NST reactive and reassuring. Patient endorses fetal movement since arrival and has pushed NST clicker several times Audible fetal movement    Assessment: 1. Supervision of other normal pregnancy, antepartum   2. [redacted] weeks gestation of pregnancy   3. Decreased fetal movements in third trimester, single or unspecified fetus   4. NST (non-stress test) reactive   5. Nausea and vomiting during pregnancy     Plan: Discharge home in stable condition  Labor  precautions and fetal kick counts reviewed Phenergan suppository rx refilled per patient request Strict return precuations reviewed Patient to keep appointment at [facility] as scheduled on 11/2 Return to MAU as needed    Follow-up Information     [facility] Follow up.   Specialty: Obstetrics and Gynecology Why: as scheduled on 11/2. Return to MAU as needed. Contact information: [address] [place] [address] [phone]                Allergies as of 12/05/2020   No Known Allergies      Medication List     TAKE these medications    acetaminophen 650 MG suppository Commonly known as: TYLENOL Place 1 suppository (650 mg total) rectally  every 6 (six) hours as needed.   famotidine 20 MG tablet Commonly known as: PEPCID Take 1 tablet (20 mg total) by mouth 2 (two) times daily.   PRENATAL GUMMIES PO Take 1 tablet by mouth daily. Unknown strength   promethazine 25 MG suppository Commonly known as: PHENERGAN Place 1 suppository (25 mg total) rectally every 6 (six) hours as needed for nausea or vomiting.   terconazole 0.4 % vaginal cream Commonly known as: TERAZOL 7 Place 1 applicator vaginally at bedtime. Use for seven days        Camelia Eng, [place] 12/05/2020 6:30 AM

## 2020-12-05 NOTE — MAU Note (Signed)
PT SAYS LAST MOVEMENT WAS AT 8 PM IN TRIAGE - FHR-120. PNC- FAMINA SAYS KICKED X1 IN LOBBY  NO UC'S

## 2020-12-08 ENCOUNTER — Other Ambulatory Visit: Payer: Self-pay

## 2020-12-08 ENCOUNTER — Ambulatory Visit: Payer: BC Managed Care – PPO | Attending: Obstetrics

## 2020-12-08 ENCOUNTER — Encounter: Payer: Self-pay | Admitting: *Deleted

## 2020-12-08 ENCOUNTER — Ambulatory Visit: Payer: BC Managed Care – PPO | Admitting: *Deleted

## 2020-12-08 VITALS — BP 110/69 | HR 97

## 2020-12-08 DIAGNOSIS — Z3480 Encounter for supervision of other normal pregnancy, unspecified trimester: Secondary | ICD-10-CM

## 2020-12-09 ENCOUNTER — Ambulatory Visit (INDEPENDENT_AMBULATORY_CARE_PROVIDER_SITE_OTHER): Payer: BC Managed Care – PPO | Admitting: Women's Health

## 2020-12-09 VITALS — BP 109/72 | HR 101 | Wt 125.0 lb

## 2020-12-09 DIAGNOSIS — O219 Vomiting of pregnancy, unspecified: Secondary | ICD-10-CM

## 2020-12-09 DIAGNOSIS — D573 Sickle-cell trait: Secondary | ICD-10-CM

## 2020-12-09 DIAGNOSIS — Z3480 Encounter for supervision of other normal pregnancy, unspecified trimester: Secondary | ICD-10-CM

## 2020-12-09 DIAGNOSIS — Z3A35 35 weeks gestation of pregnancy: Secondary | ICD-10-CM

## 2020-12-09 NOTE — Progress Notes (Signed)
Subjective:  Crystal Bentley is a 25 y.o. G1P0000 at [redacted]w[redacted]d being seen today for ongoing prenatal care.  She is currently monitored for the following issues for this low-risk pregnancy and has Supervision of other normal pregnancy, antepartum; Sickle cell trait (HCC); Alpha thalassemia silent carrier; No blood products; Nausea and vomiting during pregnancy; Back pain in pregnancy; and Palpitations on their problem list.  Patient reports no complaints.  Contractions: Not present. Vag. Bleeding: None.  Movement: Present. Denies leaking of fluid.   The following portions of the patient's history were reviewed and updated as appropriate: allergies, current medications, past family history, past medical history, past social history, past surgical history and problem list. Problem list updated.  Objective:   Vitals:   12/09/20 1619  BP: 109/72  Pulse: (!) 101  Weight: 125 lb (56.7 kg)    Fetal Status: Fetal Heart Rate (bpm): 136   Movement: Present     General:  Alert, oriented and cooperative. Patient is in no acute distress.  Skin: Skin is warm and dry. No rash noted.   Cardiovascular: Normal heart rate noted  Respiratory: Normal respiratory effort, no problems with respiration noted  Abdomen: Soft, gravid, appropriate for gestational age. Pain/Pressure: Present     Pelvic: Vag. Bleeding: None     Cervical exam deferred        Extremities: Normal range of motion.  Edema: None  Mental Status: Normal mood and affect. Normal behavior. Normal judgment and thought content.   Urinalysis:      Assessment and Plan:  Pregnancy: G1P0000 at [redacted]w[redacted]d  1. Supervision of other normal pregnancy, antepartum -GBS next visit PHQ9 SCORE ONLY 10/14/2020 06/30/2020  PHQ-9 Total Score 4 0   GAD 7 : Generalized Anxiety Score 10/14/2020  Nervous, Anxious, on Edge 0  Control/stop worrying 0  Worry too much - different things 0  Trouble relaxing 1  Restless 1  Easily annoyed or irritable 0  Afraid - awful  might happen 0  Total GAD 7 Score 2   2. Sickle cell trait (HCC) -urine culture 11/21/2020 neg  3. Nausea and vomiting during pregnancy -pt states vomiting 3 times daily  4. [redacted] weeks gestation of pregnancy  Preterm labor symptoms and general obstetric precautions including but not limited to vaginal bleeding, contractions, leaking of fluid and fetal movement were reviewed in detail with the patient. I discussed the assessment and treatment plan with the patient. The patient was provided an opportunity to ask questions and all were answered. The patient agreed with the plan and demonstrated an understanding of the instructions. The patient was advised to call back or seek an in-person office evaluation/go to MAU at [facility] for any urgent or concerning symptoms. Please refer to After Visit Summary for other counseling recommendations.  Return in about 1 week (around 12/16/2020) for in-person LOB/APP OK/GBS/cultures.   Edilson Vital, [name], NP

## 2020-12-09 NOTE — Patient Instructions (Signed)
Maternity Assessment Unit (MAU)  The Maternity Assessment Unit (MAU) is located at the [facility]. The address is: [address], [address], [place], [place] [address]. Please see map below for additional directions.    The Maternity Assessment Unit is designed to help you during your pregnancy, and for up to 6 weeks after delivery, with any pregnancy- or postpartum-related emergencies, if you think you are in labor, or if your water has broken. For example, if you experience nausea and vomiting, vaginal bleeding, severe abdominal or pelvic pain, elevated blood pressure or other problems related to your pregnancy or postpartum time, please come to the Maternity Assessment Unit for assistance.       Preterm Labor The normal length of a pregnancy is 39-41 weeks. Preterm labor is when labor starts before 37 completed weeks of pregnancy. Babies who are born prematurely and survive may not be fully developed and may be at an increased risk for long-term problems such as cerebral palsy, developmental delays, and vision and hearing problems. Babies who are born too early may have problems soon after birth. Premature babies may have problems regulating blood sugar, body temperature, heart rate, and breathing rate. These babies often have trouble with feeding. The risk of having problems is highest for babies who are born before 34 weeks of pregnancy. What are the causes? The exact cause of this condition is not known. What increases the risk? You are more likely to have preterm labor if you have certain risk factors that relate to your medical history, problems with present and past pregnancies, and lifestyle factors. Medical history You have abnormalities of the uterus, including a short cervix. You have STIs (sexually transmitted infections) or other infections of the urinary tract and the vagina. You have chronic illnesses, such as blood clotting  problems, diabetes, or high blood pressure. You are overweight or underweight. Present and past pregnancies You have had preterm labor before. You are pregnant with twins or other multiples. You have been diagnosed with a condition in which the placenta covers your cervix (placenta previa). You waited less than 18 months between giving birth and becoming pregnant again. Your unborn baby has some abnormalities. You have vaginal bleeding during pregnancy. You became pregnant through in vitro fertilization (IVF). Lifestyle and environmental factors You use tobacco products or drink alcohol. You use drugs. You have stress and no social support. You experience domestic violence. You are exposed to certain chemicals or environmental pollutants. Other factors You are younger than age 17 or older than age 35. What are the signs or symptoms? Symptoms of this condition include: Cramps similar to those that can happen during a menstrual period. The cramps may happen with diarrhea. Pain in the abdomen or lower back. Regular contractions that may feel like tightening of the abdomen. A feeling of increased pressure in the pelvis. Increased watery or bloody mucus discharge from the vagina. Water breaking (ruptured amniotic sac). How is this diagnosed? This condition is diagnosed based on: Your medical history and a physical exam. A pelvic exam. An ultrasound. Monitoring your uterus for contractions. Other tests, including: A swab of the cervix to check for a chemical called fetal fibronectin. Urine tests. How is this treated? Treatment for this condition depends on the length of your pregnancy, your condition, and the health of your baby. Treatment may include: Taking medicines, such as: Hormone medicines. These may be given early in pregnancy to help support the pregnancy. Medicines to stop   contractions. Medicines to help mature the baby's lungs. These may be prescribed if the risk of  delivery is high. Medicines to help protect your baby from brain and nerve complications such as cerebral palsy. Bed rest. If the labor happens before 34 weeks of pregnancy, you may need to stay in the hospital. Delivery of the baby. Follow these instructions at home:  Do not use any products that contain nicotine or tobacco. These products include cigarettes, chewing tobacco, and vaping devices, such as e-cigarettes. If you need help quitting, ask your health care provider. Do not drink alcohol. Take over-the-counter and prescription medicines only as told by your health care provider. Rest as told by your health care provider. Return to your normal activities as told by your health care provider. Ask your health care provider what activities are safe for you. Keep all follow-up visits. This is important. How is this prevented? To increase your chance of having a full-term pregnancy: Do not use drugs or take medicines that have not been prescribed to you during your pregnancy. Talk with your health care provider before taking any herbal supplements, even if you have been taking them regularly. Make sure you gain a healthy amount of weight during your pregnancy. Watch for infection. If you think that you might have an infection, get it checked right away. Symptoms of infection may include: Fever. Abnormal vaginal discharge or discharge that smells bad. Pain or burning with urination. Needing to urinate urgently. Frequently urinating or passing small amounts of urine frequently. Blood in your urine or urine that smells bad or unusual. Where to find more information U.S. Department of Health and Human Services Office on Women's Health: www.womenshealth.gov The American College of Obstetricians and Gynecologists: www.acog.org Centers for Disease Control and Prevention, Preterm Birth: www.cdc.gov Contact a health care provider if: You think you are going into preterm labor. You have signs  or symptoms of preterm labor. You have symptoms of infection. Get help right away if: You are having regular, painful contractions every 5 minutes or less. Your water breaks. Summary Preterm labor is labor that starts before you reach 37 weeks of pregnancy. Delivering your baby early increases your baby's risk of developing long-term problems. You are more likely to have preterm labor if you have certain risk factors that relate to your medical history, problems with present and past pregnancies, and lifestyle factors. Keep all follow-up visits. This is important. Contact a health care provider if you have signs or symptoms of preterm labor. This information is not intended to replace advice given to you by your health care provider. Make sure you discuss any questions you have with your health care provider. Document Revised: 01/28/2020 Document Reviewed: 01/28/2020 Elsevier Patient Education  2022 Elsevier Inc.       Group B Streptococcus Test During Pregnancy Why am I having this test? Routine testing, also called screening, for group B streptococcus (GBS) is recommended for all pregnant women between the 36th and 37th week of pregnancy. GBS is a type of bacteria that can be passed from mother to baby during childbirth. Screening will help guide whether or not you will need treatment during labor and delivery to prevent complications such as: An infection in your uterus during labor. An infection in your uterus after delivery. A serious infection in your baby after delivery, such as pneumonia, meningitis, or sepsis. GBS screening is not often done before 36 weeks of pregnancy unless you go into labor prematurely. What happens if I have group   B streptococcus? If testing shows that you have GBS, your health care provider will recommend treatment with IV antibiotics during labor and delivery. This treatment significantly decreases the risk of complications for you and your baby. If you  have a planned C-section and you have GBS, you may not need to be treated with antibiotics because GBS is usually passed to babies after labor starts and your water breaks. If you are in labor or your water breaks before your C-section, it is possible for GBS to get into your uterus and be passed to your baby, so you might need treatment. Is there a chance I may not need to be tested? You may not need to be tested for GBS if: You have a urine test that shows GBS before 36 to 37 weeks. You had a baby with GBS infection after a previous delivery. In these cases, you will automatically be treated for GBS during labor and delivery. What is being tested? This test is done to check if you have group B streptococcus in your vagina or rectum. What kind of sample is taken? To collect samples for this test, your health care provider will swab your vagina and rectum with a cotton swab. The sample is then sent to the lab to see if GBS is present. What happens during the test?  You will remove your clothing from the waist down. You will lie down on an exam table in the same position as you would for a pelvic exam. Your health care provider will swab your vagina and rectum to collect samples for a culture test. You will be able to go home after the test and do all your usual activities. How are the results reported? The test results are reported as positive or negative. What do the results mean? A positive test means you are at risk for passing GBS to your baby during labor and delivery. Your health care provider will recommend that you are treated with an IV antibiotic during labor and delivery. A negative test means you are at very low risk of passing GBS to your baby. There is still a low risk of passing GBS to your baby because sometimes test results may report that you do not have a condition when you do (false-negative result) or there is a chance that you may become infected with GBS after the test is  done. You most likely will not need to be treated with an antibiotic during labor and delivery. Talk with your health care provider about what your results mean. Questions to ask your health care provider Ask your health care provider, or the department that is doing the test: When will my results be ready? How will I get my results? What are my treatment options? Summary Routine testing (screening) for group B streptococcus (GBS) is recommended for all pregnant women between the 36th and 37th week of pregnancy. GBS is a type of bacteria that can be passed from mother to baby during childbirth. If testing shows that you have GBS, your health care provider will recommend that you are treated with IV antibiotics during labor and delivery. This treatment almost always prevents infection in newborns. This information is not intended to replace advice given to you by your health care provider. Make sure you discuss any questions you have with your health care provider. Document Revised: 11/26/2019 Document Reviewed: 02/21/2018 Elsevier Patient Education  2022 Elsevier Inc.  

## 2020-12-15 ENCOUNTER — Telehealth: Payer: Self-pay | Admitting: *Deleted

## 2020-12-15 NOTE — Telephone Encounter (Signed)
TC from patient concerned with "rumbling in my stomach". Reports good fetal movement. Denies vaginal bleeding. Reports infrequent mild UCs. Denies LOF. Reassured patient. Advised follow up as scheduled. Advised to seek care for vaginal bleeding, regular UC's, LOF, or decreased FM.

## 2020-12-17 ENCOUNTER — Inpatient Hospital Stay (HOSPITAL_COMMUNITY)
Admission: AD | Admit: 2020-12-17 | Discharge: 2020-12-17 | Disposition: A | Discharge status: Home / Self Care | Payer: BC Managed Care – PPO | Attending: Family Medicine | Admitting: Family Medicine

## 2020-12-17 ENCOUNTER — Encounter (HOSPITAL_COMMUNITY): Payer: Self-pay | Admitting: Family Medicine

## 2020-12-17 DIAGNOSIS — R82998 Other abnormal findings in urine: Secondary | ICD-10-CM

## 2020-12-17 DIAGNOSIS — O99891 Other specified diseases and conditions complicating pregnancy: Secondary | ICD-10-CM

## 2020-12-17 DIAGNOSIS — Z3A36 36 weeks gestation of pregnancy: Secondary | ICD-10-CM | POA: Diagnosis not present

## 2020-12-17 DIAGNOSIS — R112 Nausea with vomiting, unspecified: Secondary | ICD-10-CM

## 2020-12-17 DIAGNOSIS — Z20822 Contact with and (suspected) exposure to covid-19: Secondary | ICD-10-CM | POA: Diagnosis not present

## 2020-12-17 DIAGNOSIS — O212 Late vomiting of pregnancy: Secondary | ICD-10-CM | POA: Diagnosis not present

## 2020-12-17 DIAGNOSIS — O26893 Other specified pregnancy related conditions, third trimester: Secondary | ICD-10-CM | POA: Diagnosis not present

## 2020-12-17 DIAGNOSIS — O99013 Anemia complicating pregnancy, third trimester: Secondary | ICD-10-CM | POA: Insufficient documentation

## 2020-12-17 DIAGNOSIS — R109 Unspecified abdominal pain: Secondary | ICD-10-CM | POA: Insufficient documentation

## 2020-12-17 DIAGNOSIS — O219 Vomiting of pregnancy, unspecified: Secondary | ICD-10-CM | POA: Diagnosis not present

## 2020-12-17 DIAGNOSIS — Z3480 Encounter for supervision of other normal pregnancy, unspecified trimester: Secondary | ICD-10-CM

## 2020-12-17 DIAGNOSIS — R519 Headache, unspecified: Secondary | ICD-10-CM | POA: Diagnosis not present

## 2020-12-17 LAB — URINALYSIS, ROUTINE W REFLEX MICROSCOPIC
Bilirubin Urine: NEGATIVE
Glucose, UA: NEGATIVE mg/dL
Hgb urine dipstick: NEGATIVE
Ketones, ur: NEGATIVE mg/dL
Nitrite: NEGATIVE
Protein, ur: NEGATIVE mg/dL
Specific Gravity, Urine: 1.006 (ref 1.005–1.030)
pH: 5 (ref 5.0–8.0)

## 2020-12-17 LAB — CBC
HCT: 26.9 % — ABNORMAL LOW (ref 36.0–46.0)
Hemoglobin: 8.5 g/dL — ABNORMAL LOW (ref 12.0–15.0)
MCH: 24.1 pg — ABNORMAL LOW (ref 26.0–34.0)
MCHC: 31.6 g/dL (ref 30.0–36.0)
MCV: 76.4 fL — ABNORMAL LOW (ref 80.0–100.0)
Platelets: 456 10*3/uL — ABNORMAL HIGH (ref 150–400)
RBC: 3.52 MIL/uL — ABNORMAL LOW (ref 3.87–5.11)
RDW: 16.3 % — ABNORMAL HIGH (ref 11.5–15.5)
WBC: 11.5 10*3/uL — ABNORMAL HIGH (ref 4.0–10.5)
nRBC: 0 % (ref 0.0–0.2)

## 2020-12-17 LAB — RESP PANEL BY RT-PCR (FLU A&B, COVID) ARPGX2
Influenza A by PCR: NEGATIVE
Influenza B by PCR: NEGATIVE
SARS Coronavirus 2 by RT PCR: NEGATIVE

## 2020-12-17 MED ORDER — DIPHENHYDRAMINE HCL 50 MG/ML IJ SOLN
25.0000 mg | Freq: Once | INTRAMUSCULAR | Status: AC
  Administered 2020-12-17: 25 mg via INTRAVENOUS
  Filled 2020-12-17: qty 1

## 2020-12-17 MED ORDER — METOCLOPRAMIDE HCL 5 MG/ML IJ SOLN
5.0000 mg | Freq: Once | INTRAMUSCULAR | Status: AC
  Administered 2020-12-17: 5 mg via INTRAVENOUS
  Filled 2020-12-17: qty 2

## 2020-12-17 MED ORDER — CYCLOBENZAPRINE HCL 5 MG PO TABS
10.0000 mg | ORAL_TABLET | Freq: Once | ORAL | Status: AC
  Administered 2020-12-17: 10 mg via ORAL
  Filled 2020-12-17: qty 2

## 2020-12-17 MED ORDER — LACTATED RINGERS IV SOLN: Freq: Once | INTRAVENOUS | Status: AC

## 2020-12-17 NOTE — MAU Note (Signed)
Pt stated she has been having ctx on and off since last week. Closer together now. Stated she stared having a headache last night when the ctx got worse. Took some tylenol with some relief but headache is worse now and having some N/V along with it. Abd pain gets worse when baby moves. Cramping every few minutes. Denies any vag bleeding or leaking.

## 2020-12-17 NOTE — MAU Provider Note (Addendum)
Patient Crystal Bentley is a 25 y.o. G1P0000  At [redacted]w[redacted]d here with complaints of HA and abdominal pain. She denies LOF, decreased fetal movements. She denies any complications in this pregnancy; she has been told in the past that she is anemic. She denies any complications in this pregnancy; she denies any history of DM, HTN. She is a Scientist, product/process development and affirms that she does not want any blood products.  History     CSN: [id number]  Arrival date and time: 12/17/20 1802   Event Date/Time   First Provider Initiated Contact with Patient 12/17/20 1951      Chief Complaint  Patient presents with   Contractions   Headache   Headache  This is a new problem. The current episode started yesterday. The problem occurs constantly. The pain is located in the Bilateral region. The pain does not radiate. The quality of the pain is described as aching. The pain is at a severity of 7/10. Associated symptoms include abdominal pain and nausea. Pertinent negatives include no fever, phonophobia, photophobia or rhinorrhea. Associated symptoms comments: contractions. Nothing aggravates the symptoms. She has tried acetaminophen for the symptoms. The treatment provided mild relief.   OB History     Gravida  1   Para  0   Term  0   Preterm  0   AB  0   Living  0      SAB  0   IAB  0   Ectopic  0   Multiple  0   Live Births  0           Past Medical History:  Diagnosis Date   Medical history non-contributory     Past Surgical History:  Procedure Laterality Date   INGUINAL HERNIA REPAIR     4 yrs    OVARIAN CYST REMOVAL     6 yrs     Family History  Problem Relation Age of Onset   Healthy Mother    Stroke Father     Social History   Tobacco Use   Smoking status: Never   Smokeless tobacco: Never  Vaping Use   Vaping Use: Never used  Substance Use Topics   Alcohol use: Never   Drug use: Never    Allergies: No Known Allergies  Medications Prior to Admission   Medication Sig Dispense Refill Last Dose   Prenatal MV & Min w/FA-DHA (PRENATAL GUMMIES PO) Take 1 tablet by mouth daily. Unknown strength   12/16/2020   promethazine (PHENERGAN) 25 MG suppository Place 1 suppository (25 mg total) rectally every 6 (six) hours as needed for nausea or vomiting. 30 suppository 3 12/17/2020   acetaminophen (TYLENOL) 650 MG suppository Place 1 suppository (650 mg total) rectally every 6 (six) hours as needed. (Patient not taking: Reported on 11/25/2020) 12 suppository 0    famotidine (PEPCID) 20 MG tablet Take 1 tablet (20 mg total) by mouth 2 (two) times daily. (Patient not taking: Reported on 11/25/2020) 30 tablet 0    terconazole (TERAZOL 7) 0.4 % vaginal cream Place 1 applicator vaginally at bedtime. Use for seven days (Patient not taking: Reported on 12/08/2020) 45 g 0     Review of Systems  Constitutional: Negative.  Negative for fever.  HENT: Negative.  Negative for rhinorrhea.   Eyes:  Negative for photophobia.  Respiratory: Negative.    Cardiovascular: Negative.   Gastrointestinal:  Positive for abdominal pain and nausea.  Genitourinary: Negative.   Neurological:  Positive for headaches.  Physical  Exam   Blood pressure 123/84, pulse 98, temperature 98.2 F (36.8 C), resp. rate 19, last menstrual period 04/09/2020, SpO2 100 %.  Physical Exam Constitutional:      Appearance: She is well-developed.  HENT:     Head: Normocephalic.  Abdominal:     Palpations: Abdomen is soft.  Musculoskeletal:        General: Normal range of motion.  Skin:    General: Skin is warm.  Neurological:     Mental Status: She is alert.  Psychiatric:        Mood and Affect: Mood is not anxious.        Behavior: Behavior is agitated.     Comments: Appears distressed and a lethargic    MAU Course  Procedures  MDM -NST : 120 bpm, mod var, present acel, no decels, occasional contractions -Flexeril and a snack; she reports that she feels better.  -CBC pending    Patient care endorsed to Wynelle Bourgeois.   Began vomiting and headache returned Will start an IV and give Reglan and Benadryl Will send a Covid test  Results for orders placed or performed during the hospital encounter of 12/17/20 (from the past 24 hour(s))  Urinalysis, Routine w reflex microscopic Urine, Clean Catch     Status: Abnormal   Collection Time: 12/17/20  6:38 PM  Result Value Ref Range   Color, Urine YELLOW YELLOW   APPearance CLOUDY (A) CLEAR   Specific Gravity, Urine 1.006 1.005 - 1.030   pH 5.0 5.0 - 8.0   Glucose, UA NEGATIVE NEGATIVE mg/dL   Hgb urine dipstick NEGATIVE NEGATIVE   Bilirubin Urine NEGATIVE NEGATIVE   Ketones, ur NEGATIVE NEGATIVE mg/dL   Protein, ur NEGATIVE NEGATIVE mg/dL   Nitrite NEGATIVE NEGATIVE   Leukocytes,Ua LARGE (A) NEGATIVE   RBC / HPF 0-5 0 - 5 RBC/hpf   WBC, UA 21-50 0 - 5 WBC/hpf   Bacteria, UA FEW (A) NONE SEEN   Squamous Epithelial / LPF 11-20 0 - 5   Mucus PRESENT   CBC     Status: Abnormal   Collection Time: 12/17/20  8:31 PM  Result Value Ref Range   WBC 11.5 (H) 4.0 - 10.5 K/uL   RBC 3.52 (L) 3.87 - 5.11 MIL/uL   Hemoglobin 8.5 (L) 12.0 - 15.0 g/dL   HCT 60.4 (L) 54.0 - 98.1 %   MCV 76.4 (L) 80.0 - 100.0 fL   MCH 24.1 (L) 26.0 - 34.0 pg   MCHC 31.6 30.0 - 36.0 g/dL   RDW 19.1 (H) 47.8 - 29.5 %   Platelets 456 (H) 150 - 400 K/uL   nRBC 0.0 0.0 - 0.2 %  Resp Panel by RT-PCR (Flu A&B, Covid) Nasopharyngeal Swab     Status: None   Collection Time: 12/17/20  9:34 PM   Specimen: Nasopharyngeal Swab; Nasopharyngeal(NP) swabs in vial transport medium  Result Value Ref Range   SARS Coronavirus 2 by RT PCR NEGATIVE NEGATIVE   Influenza A by PCR NEGATIVE NEGATIVE   Influenza B by PCR NEGATIVE NEGATIVE    Assessment and Plan  A:  Single IUP at [redacted]w[redacted]d       Nausea and vomiting       Headache       Leukocytes in urine       Anemia  P:   Discharge home       Has Phenergan at home       Headache resolved  Urine to  culture       Message sent to Tyler Aas CNM who is seeing her tomorrow to arrange for possible Venofer/Fereheme       Encouraged to return if she develops worsening of symptoms, increase in pain, fever, or other concerning symptoms.   Aviva Signs, CNM   Charlesetta Garibaldi Kooistra 12/17/2020, 8:06 PM

## 2020-12-18 ENCOUNTER — Ambulatory Visit (INDEPENDENT_AMBULATORY_CARE_PROVIDER_SITE_OTHER): Payer: BC Managed Care – PPO | Admitting: Certified Nurse Midwife

## 2020-12-18 ENCOUNTER — Other Ambulatory Visit (HOSPITAL_COMMUNITY)
Admission: RE | Admit: 2020-12-18 | Discharge: 2020-12-18 | Disposition: A | Discharge status: Home / Self Care | Payer: BC Managed Care – PPO | Source: Ambulatory Visit | Attending: Certified Nurse Midwife | Admitting: Certified Nurse Midwife

## 2020-12-18 ENCOUNTER — Encounter: Payer: Self-pay | Admitting: Certified Nurse Midwife

## 2020-12-18 ENCOUNTER — Other Ambulatory Visit: Payer: Self-pay

## 2020-12-18 VITALS — BP 129/88 | HR 101 | Wt 128.6 lb

## 2020-12-18 DIAGNOSIS — O99013 Anemia complicating pregnancy, third trimester: Secondary | ICD-10-CM

## 2020-12-18 DIAGNOSIS — D509 Iron deficiency anemia, unspecified: Secondary | ICD-10-CM

## 2020-12-18 DIAGNOSIS — Z3480 Encounter for supervision of other normal pregnancy, unspecified trimester: Secondary | ICD-10-CM

## 2020-12-18 DIAGNOSIS — Z3A36 36 weeks gestation of pregnancy: Secondary | ICD-10-CM

## 2020-12-18 LAB — CULTURE, OB URINE: Culture: <10000 — AB

## 2020-12-18 LAB — OB RESULTS CONSOLE GC/CHLAMYDIA: Gonorrhea: NEGATIVE

## 2020-12-18 NOTE — Progress Notes (Signed)
   PRENATAL VISIT NOTE  Subjective:  Crystal Bentley is a 25 y.o. G1P0000 at [redacted]w[redacted]d being seen today for ongoing prenatal care.  She is currently monitored for the following issues for this low-risk pregnancy and has Supervision of other normal pregnancy, antepartum; Sickle cell trait (HCC); Alpha thalassemia silent carrier; No blood products; Nausea and vomiting during pregnancy; Back pain in pregnancy; and Palpitations on their problem list.  Patient reports  presenting to MAU for a headache yesterday, feeling better today but tired and has low back pain with movement .  Contractions: Not present. Vag. Bleeding: None.  Movement: Present. Denies leaking of fluid.   The following portions of the patient's history were reviewed and updated as appropriate: allergies, current medications, past family history, past medical history, past social history, past surgical history and problem list.   Objective:   Vitals:   12/18/20 0856  BP: 129/88  Pulse: (!) 101  Weight: 128 lb 9.6 oz (58.3 kg)   Fetal Status: Fetal Heart Rate (bpm): 132 Fundal Height: 36 cm Movement: Present     General:  Alert, oriented and cooperative. Patient is in no acute distress.  Skin: Skin is warm and dry. No rash noted.   Cardiovascular: Normal heart rate noted  Respiratory: Normal respiratory effort, no problems with respiration noted  Abdomen: Soft, gravid, appropriate for gestational age.  Pain/Pressure: Present     Pelvic: Cervical exam deferred        Extremities: Normal range of motion.  Edema: None  Mental Status: Normal mood and affect. Normal behavior. Normal judgment and thought content.   Assessment and Plan:  Pregnancy: G1P0000 at [redacted]w[redacted]d 1. Supervision of other normal pregnancy, antepartum - Doing well overall, feeling regular and vigorous fetal movement   2. [redacted] weeks gestation of pregnancy - Routine OB care - Cervicovaginal ancillary only( [place]) - Strep Gp B NAA  3. Iron deficiency anemia  during pregnancy - Discussed low hgb found in MAU yesterday and discussed importance of getting it increased prior to delivery to decrease risk of hemorrhage. Pt amenable to iron infusion, orders placed. - iron sucrose (VENOFER) 500 mg in sodium chloride 0.9 % 250 mL IVPB  Preterm labor symptoms and general obstetric precautions including but not limited to vaginal bleeding, contractions, leaking of fluid and fetal movement were reviewed in detail with the patient. Please refer to After Visit Summary for other counseling recommendations.   Return in about 1 week (around 12/25/2020) for IN-PERSON, LOB.  Future Appointments  Date Time Provider Department Center  12/24/2020  8:55 AM Conan Bowens, MD [facility] None    Bernerd Limbo, CNM

## 2020-12-18 NOTE — Progress Notes (Signed)
Pt presents for ROB, GBS, and GC/CT cultures. Hgb 8.5 yesterday  PHQ9= 0 GAD7 = 0

## 2020-12-20 LAB — STREP GP B NAA: Strep Gp B NAA: NEGATIVE

## 2020-12-21 LAB — CERVICOVAGINAL ANCILLARY ONLY
Chlamydia: NEGATIVE
Comment: NEGATIVE
Comment: NORMAL
Neisseria Gonorrhea: NEGATIVE

## 2020-12-24 ENCOUNTER — Encounter: Payer: Self-pay | Admitting: Obstetrics and Gynecology

## 2020-12-24 ENCOUNTER — Inpatient Hospital Stay (HOSPITAL_BASED_OUTPATIENT_CLINIC_OR_DEPARTMENT_OTHER): Payer: BC Managed Care – PPO

## 2020-12-24 ENCOUNTER — Inpatient Hospital Stay (HOSPITAL_COMMUNITY)
Admission: AD | Admit: 2020-12-24 | Discharge: 2020-12-24 | Disposition: A | Discharge status: Home / Self Care | Payer: BC Managed Care – PPO | Attending: Obstetrics & Gynecology | Admitting: Obstetrics & Gynecology

## 2020-12-24 ENCOUNTER — Other Ambulatory Visit: Payer: Self-pay

## 2020-12-24 ENCOUNTER — Ambulatory Visit (INDEPENDENT_AMBULATORY_CARE_PROVIDER_SITE_OTHER): Payer: BC Managed Care – PPO | Admitting: Obstetrics and Gynecology

## 2020-12-24 ENCOUNTER — Encounter (HOSPITAL_COMMUNITY): Payer: Self-pay | Admitting: Obstetrics & Gynecology

## 2020-12-24 VITALS — BP 113/77 | HR 88 | Wt 131.0 lb

## 2020-12-24 DIAGNOSIS — O288 Other abnormal findings on antenatal screening of mother: Secondary | ICD-10-CM

## 2020-12-24 DIAGNOSIS — Z3689 Encounter for other specified antenatal screening: Secondary | ICD-10-CM | POA: Diagnosis not present

## 2020-12-24 DIAGNOSIS — O26893 Other specified pregnancy related conditions, third trimester: Secondary | ICD-10-CM | POA: Insufficient documentation

## 2020-12-24 DIAGNOSIS — R110 Nausea: Secondary | ICD-10-CM | POA: Diagnosis not present

## 2020-12-24 DIAGNOSIS — O212 Late vomiting of pregnancy: Secondary | ICD-10-CM | POA: Insufficient documentation

## 2020-12-24 DIAGNOSIS — O219 Vomiting of pregnancy, unspecified: Secondary | ICD-10-CM

## 2020-12-24 DIAGNOSIS — Z3480 Encounter for supervision of other normal pregnancy, unspecified trimester: Secondary | ICD-10-CM

## 2020-12-24 DIAGNOSIS — Z3A37 37 weeks gestation of pregnancy: Secondary | ICD-10-CM

## 2020-12-24 DIAGNOSIS — R109 Unspecified abdominal pain: Secondary | ICD-10-CM | POA: Insufficient documentation

## 2020-12-24 DIAGNOSIS — D649 Anemia, unspecified: Secondary | ICD-10-CM

## 2020-12-24 DIAGNOSIS — Z9289 Personal history of other medical treatment: Secondary | ICD-10-CM | POA: Diagnosis not present

## 2020-12-24 DIAGNOSIS — O368330 Maternal care for abnormalities of the fetal heart rate or rhythm, third trimester, not applicable or unspecified: Secondary | ICD-10-CM | POA: Diagnosis present

## 2020-12-24 DIAGNOSIS — Z789 Other specified health status: Secondary | ICD-10-CM

## 2020-12-24 DIAGNOSIS — D573 Sickle-cell trait: Secondary | ICD-10-CM

## 2020-12-24 MED ORDER — PROMETHAZINE HCL 25 MG RE SUPP: 25.0000 mg | Freq: Four times a day (QID) | RECTAL | 3 refills | Status: DC | PRN

## 2020-12-24 MED ORDER — PROMETHAZINE HCL 25 MG RE SUPP
25.0000 mg | Freq: Once | RECTAL | Status: AC
  Administered 2020-12-24: 12:00:00 25 mg via RECTAL
  Filled 2020-12-24: qty 1

## 2020-12-24 MED ORDER — PROMETHAZINE HCL 25 MG/ML IJ SOLN
12.5000 mg | Freq: Once | INTRAMUSCULAR | Status: DC
  Filled 2020-12-24: qty 1

## 2020-12-24 NOTE — Progress Notes (Signed)
   PRENATAL VISIT NOTE  Subjective:  Crystal Bentley is a 25 y.o. G1P0000 at [redacted]w[redacted]d being seen today for ongoing prenatal care.  She is currently monitored for the following issues for this low-risk pregnancy and has Supervision of other normal pregnancy, antepartum; Sickle cell trait (HCC); Alpha thalassemia silent carrier; No blood products; Nausea and vomiting during pregnancy; Back pain in pregnancy; Palpitations; and Anemia on their problem list.  Patient reports contractions since Monday, now improved. Also had headache Tuesday that improved on Wednesday. Contractions are still present but not as bad as they have been .  Contractions: Irregular. Vag. Bleeding: None.  Movement: Present. Denies leaking of fluid.   The following portions of the patient's history were reviewed and updated as appropriate: allergies, current medications, past family history, past medical history, past social history, past surgical history and problem list.   Objective:   Vitals:   12/24/20 0904  BP: 113/77  Pulse: 88  Weight: 131 lb (59.4 kg)    Fetal Status:     Movement: Present     General:  Alert, oriented and cooperative. Patient is in no acute distress.  Skin: Skin is warm and dry. No rash noted.   Cardiovascular: Normal heart rate noted  Respiratory: Normal respiratory effort, no problems with respiration noted  Abdomen: Soft, gravid, appropriate for gestational age.  Pain/Pressure: Present     Pelvic: Cervical exam deferred        Extremities: Normal range of motion.  Edema: None  Mental Status: Normal mood and affect. Normal behavior. Normal judgment and thought content.   Assessment and Plan:  Pregnancy: G1P0000 at [redacted]w[redacted]d  1. Supervision of other normal pregnancy, antepartum - Patient put on NST for doppler in 110s, baseline persistently 110s on NST with accels and occasional dips below 110s.  - will send to MAU for BPP given term - she is agreeable to plan - MAU aware  2. Sickle  cell trait (HCC)  3. No blood products  4. [redacted] weeks gestation of pregnancy  5. Anemia, unspecified type Patient found to be anemic last week, has IV iron transfusion scheduled for tomorrow   Term labor symptoms and general obstetric precautions including but not limited to vaginal bleeding, contractions, leaking of fluid and fetal movement were reviewed in detail with the patient. Please refer to After Visit Summary for other counseling recommendations.   Return in about 1 week (around 12/31/2020) for low OB, in person.  Future Appointments  Date Time Provider Department Center  12/25/2020  9:00 AM [id number] None  12/29/2020  8:55 AM Brock Bad, MD [facility] None    Conan Bowens, MD

## 2020-12-24 NOTE — MAU Note (Signed)
Crystal Bentley is a 25 y.o. at [redacted]w[redacted]d here in MAU reporting: was sent over from the office for prolonged monitoring. +FM. Having some cramping. Denies bleeding or LOF.  Onset of complaint: today  Pain score: 5/10  Vitals:   12/24/20 1041  BP: 131/83  Pulse: 89  Resp: 17  Temp: 98.1 F (36.7 C)  SpO2: 100%     FHT: EFM applied in room  Lab orders placed from triage: none

## 2020-12-24 NOTE — Discharge Instructions (Signed)
You had testing in the office that showed your baby's heart rate was on the lower end of normal. We monitored you here and your baby's heart rate was normal, and you had an ultrasound that looked at the baby's movements and was normal as well.  You are safe to discharge home. Please go to your next prenatal visit.  Please seek medical care if you have painful contractions less than 5 minutes apart, vaginal bleeding, feel like your water might be broken, or you don't feel your baby move as usual.

## 2020-12-24 NOTE — MAU Provider Note (Addendum)
  History     CSN: [id number]  Arrival date and time: 12/24/20 1020  HPI Crystal Bentley is a 25 y/o G1P0 at [redacted]w[redacted]d presenting from [facility] office for BPP due to persistent baseline fetal HR in 110s on doppler and then on on NST in the office. In office NST she had accels and occasional dips below 110s.   Here she reports she had mild abdominal cramping present on Monday that was more frequent yesterday that has now resolved . No LOF or vaginal bleeding. Has had nausea and vomiting throughout entire pregnancy, and would like something to alleviate nausea (takes Phenergan suppository at home). Last ate at dinnertime yesterday. Otherwise, she feels well.  Past Medical History:  Diagnosis Date   Medical history non-contributory     Past Surgical History:  Procedure Laterality Date   INGUINAL HERNIA REPAIR     4 yrs    OVARIAN CYST REMOVAL     6 yrs     Family History  Problem Relation Age of Onset   Healthy Mother    Stroke Father     Social History   Tobacco Use   Smoking status: Never   Smokeless tobacco: Never  Vaping Use   Vaping Use: Never used  Substance Use Topics   Alcohol use: Never   Drug use: Never    Allergies: No Known Allergies  No medications prior to admission.    Review of Systems Physical Exam   Blood pressure 107/65, pulse 91, temperature 98.1 F (36.7 C), temperature source Oral, resp. rate 17, last menstrual period 04/09/2020, SpO2 99 %.  Physical Exam Constitutional:      General: She is not in acute distress. Cardiovascular:     Rate and Rhythm: Normal rate.  Pulmonary:     Effort: Pulmonary effort is normal.  Abdominal:     Comments: gravid  Neurological:     Mental Status: She is alert.    MAU Course  Procedures  MDM 25 y/o F G1P0 at [redacted]w[redacted]d sent in from prenatal visit for low baseline FHR on Doppler and in-office NST, once in MAU had reactive NST (baseline 130s) and 8/8 BPP.  Level 3- moderate Assessment and Plan   #Low baseline  FHR in office #Reactive NST, 8/8 BPP  G1P0 at [redacted]w[redacted]d sent in from prenatal visit for low baseline FHR on Doppler and in-office NST, once in MAU had reactive NST (baseline 130s) and 8/8 BPP. - Reassuring, safe for discharge with follow up in clinic on 12/29/2020 (already scheduled)   #Uterine irritability - Patient with intermittent irritability on toco. - Reports mild discomfort but able to breathe through them -  >5 minutes apart. No signs of active labor at this time. Gave labor return precautions.  #Nausea Patient with known history of nausea in pregnancy at home on Phenergan suppository daily (per patient preference) and did not take her dose today and feels symptoms  -Phenergan suppository x1 in MAU  Warner Mccreedy, MD, MPH OB Fellow, Faculty Practice

## 2020-12-25 ENCOUNTER — Inpatient Hospital Stay (HOSPITAL_COMMUNITY)
Admission: AD | Admit: 2020-12-25 | Discharge: 2020-12-25 | Disposition: A | Discharge status: Home / Self Care | Payer: BC Managed Care – PPO | Attending: Obstetrics and Gynecology | Admitting: Obstetrics and Gynecology

## 2020-12-25 ENCOUNTER — Encounter (HOSPITAL_COMMUNITY): Payer: Self-pay

## 2020-12-25 ENCOUNTER — Inpatient Hospital Stay (HOSPITAL_COMMUNITY): Admission: RE | Admit: 2020-12-25 | Payer: BC Managed Care – PPO | Source: Ambulatory Visit

## 2020-12-25 ENCOUNTER — Encounter (HOSPITAL_COMMUNITY): Payer: Self-pay | Admitting: Obstetrics and Gynecology

## 2020-12-25 DIAGNOSIS — O219 Vomiting of pregnancy, unspecified: Secondary | ICD-10-CM | POA: Diagnosis not present

## 2020-12-25 DIAGNOSIS — O471 False labor at or after 37 completed weeks of gestation: Secondary | ICD-10-CM | POA: Diagnosis not present

## 2020-12-25 DIAGNOSIS — R519 Headache, unspecified: Secondary | ICD-10-CM | POA: Insufficient documentation

## 2020-12-25 DIAGNOSIS — D649 Anemia, unspecified: Secondary | ICD-10-CM | POA: Insufficient documentation

## 2020-12-25 DIAGNOSIS — Z3A37 37 weeks gestation of pregnancy: Secondary | ICD-10-CM | POA: Diagnosis not present

## 2020-12-25 DIAGNOSIS — R030 Elevated blood-pressure reading, without diagnosis of hypertension: Secondary | ICD-10-CM | POA: Diagnosis not present

## 2020-12-25 DIAGNOSIS — O26893 Other specified pregnancy related conditions, third trimester: Secondary | ICD-10-CM | POA: Diagnosis present

## 2020-12-25 DIAGNOSIS — O212 Late vomiting of pregnancy: Secondary | ICD-10-CM | POA: Diagnosis not present

## 2020-12-25 DIAGNOSIS — O99013 Anemia complicating pregnancy, third trimester: Secondary | ICD-10-CM | POA: Diagnosis not present

## 2020-12-25 LAB — COMPREHENSIVE METABOLIC PANEL
ALT: 15 U/L (ref 0–44)
AST: 22 U/L (ref 15–41)
Albumin: 2.8 g/dL — ABNORMAL LOW (ref 3.5–5.0)
Alkaline Phosphatase: 241 U/L — ABNORMAL HIGH (ref 38–126)
Anion gap: 6 (ref 5–15)
BUN: <5 mg/dL — ABNORMAL LOW (ref 6–20)
CO2: 23 mmol/L (ref 22–32)
Calcium: 9.1 mg/dL (ref 8.9–10.3)
Chloride: 105 mmol/L (ref 98–111)
Creatinine, Ser: 0.44 mg/dL (ref 0.44–1.00)
GFR, Estimated: >60 mL/min (ref >60)
Glucose, Bld: 88 mg/dL (ref 70–99)
Potassium: 3.8 mmol/L (ref 3.5–5.1)
Sodium: 134 mmol/L — ABNORMAL LOW (ref 135–145)
Total Bilirubin: 0.6 mg/dL (ref 0.3–1.2)
Total Protein: 7 g/dL (ref 6.5–8.1)

## 2020-12-25 LAB — URINALYSIS, ROUTINE W REFLEX MICROSCOPIC
Bilirubin Urine: NEGATIVE
Glucose, UA: NEGATIVE mg/dL
Hgb urine dipstick: NEGATIVE
Ketones, ur: NEGATIVE mg/dL
Nitrite: NEGATIVE
Protein, ur: NEGATIVE mg/dL
Specific Gravity, Urine: 1.003 — ABNORMAL LOW (ref 1.005–1.030)
pH: 5 (ref 5.0–8.0)

## 2020-12-25 LAB — CBC
HCT: 27.3 % — ABNORMAL LOW (ref 36.0–46.0)
Hemoglobin: 8.4 g/dL — ABNORMAL LOW (ref 12.0–15.0)
MCH: 23.2 pg — ABNORMAL LOW (ref 26.0–34.0)
MCHC: 30.8 g/dL (ref 30.0–36.0)
MCV: 75.4 fL — ABNORMAL LOW (ref 80.0–100.0)
Platelets: 423 10*3/uL — ABNORMAL HIGH (ref 150–400)
RBC: 3.62 MIL/uL — ABNORMAL LOW (ref 3.87–5.11)
RDW: 17 % — ABNORMAL HIGH (ref 11.5–15.5)
WBC: 10.9 10*3/uL — ABNORMAL HIGH (ref 4.0–10.5)
nRBC: 0.2 % (ref 0.0–0.2)

## 2020-12-25 LAB — PROTEIN / CREATININE RATIO, URINE
Creatinine, Urine: 15.54 mg/dL
Total Protein, Urine: <6 mg/dL

## 2020-12-25 MED ORDER — SODIUM CHLORIDE 0.9 % IV SOLN: INTRAVENOUS | Status: DC

## 2020-12-25 MED ORDER — ACETAMINOPHEN 500 MG PO TABS
1000.0000 mg | ORAL_TABLET | Freq: Once | ORAL | Status: AC
  Administered 2020-12-25: 1000 mg via ORAL
  Filled 2020-12-25: qty 2

## 2020-12-25 MED ORDER — SODIUM CHLORIDE 0.9 % IV SOLN
510.0000 mg | Freq: Once | INTRAVENOUS | Status: AC
  Administered 2020-12-25: 510 mg via INTRAVENOUS
  Filled 2020-12-25: qty 17

## 2020-12-25 MED ORDER — DIPHENHYDRAMINE HCL 50 MG/ML IJ SOLN
25.0000 mg | Freq: Once | INTRAMUSCULAR | Status: AC
  Administered 2020-12-25: 25 mg via INTRAVENOUS
  Filled 2020-12-25: qty 1

## 2020-12-25 MED ORDER — LACTATED RINGERS IV SOLN: Freq: Once | INTRAVENOUS | Status: AC

## 2020-12-25 MED ORDER — METOCLOPRAMIDE HCL 5 MG/ML IJ SOLN
5.0000 mg | Freq: Once | INTRAMUSCULAR | Status: AC
  Administered 2020-12-25: 5 mg via INTRAVENOUS
  Filled 2020-12-25: qty 2

## 2020-12-25 NOTE — MAU Note (Signed)
Called Lab to check on status of Protein/Creatinine Ratio. Lab stated they are having difficulties with their machine.  Judeth Horn, NP, made aware.

## 2020-12-25 NOTE — MAU Note (Signed)
Unsuccessful attempt at calling Main Lab. No answer.

## 2020-12-25 NOTE — MAU Note (Signed)
Patient states she has not felt a CTX since 0500 this morning. Patient requesting no cervical exam unless her CTX start again.

## 2020-12-25 NOTE — MAU Note (Addendum)
...  Crystal Bentley is a 25 y.o. at [redacted]w[redacted]d here in MAU via EMS reporting: Irregular cramping/CTX since midnight this morning while at work. She states around 0400 she became very nauseous and threw up once at 0500 so she placed a phenergan suppository. She states when she threw up it caused her to have a dull HA and she threw up once more, which caused her HA to increased to a 8/10. She states her CTX are currently every 8 minutes and is no longer experiencing nausea. She received Zofran with EMS around [phone]. +FM. No VB or LOF.   Was seen here in MAU for a BPP and fetal monitoring variables seen on her NST in the office.  Pain score:  8/10 HA  FHT: 120 initial external Lab orders placed from triage:  UA

## 2020-12-25 NOTE — MAU Provider Note (Addendum)
Chief Complaint:  Contractions and Headache   Event Date/Time   First Provider Initiated Contact with Patient 12/25/20 0730     HPI: Crystal Bentley is a 25 y.o. G1P0000 at 47w5dwho presents via EMS to maternity admissions reporting nausea and headache with intermittent contractions.  Has had nausea the entire pregnancy.  States the vomiting caused her to have a headache. . She reports good fetal movement, denies LOF, vaginal bleeding, vaginal itching/burning, urinary symptoms, diarrhea, constipation or fever/chills.  She denies headache, visual changes or RUQ abdominal pain.  Headache  The current episode started today. The problem occurs constantly. The problem has been gradually worsening. The pain is located in the Frontal region. The quality of the pain is described as aching. Associated symptoms include nausea, vomiting and weakness. Pertinent negatives include no blurred vision, fever or muscle aches. Exacerbated by: vomiting. Treatments tried: phenergan suppository and Zofran. The treatment provided mild relief.    Past Medical History: Past Medical History:  Diagnosis Date   Medical history non-contributory     Past obstetric history: OB History  Gravida Para Term Preterm AB Living  1 0 0 0 0 0  SAB IAB Ectopic Multiple Live Births  0 0 0 0 0    # Outcome Date GA Lbr Len/2nd Weight Sex Delivery Anes PTL Lv  1 Current             Past Surgical History: Past Surgical History:  Procedure Laterality Date   INGUINAL HERNIA REPAIR     4 yrs    OVARIAN CYST REMOVAL     6 yrs     Family History: Family History  Problem Relation Age of Onset   Healthy Mother    Stroke Father     Social History: Social History   Tobacco Use   Smoking status: Never   Smokeless tobacco: Never  Vaping Use   Vaping Use: Never used  Substance Use Topics   Alcohol use: Never   Drug use: Never    Allergies: No Known Allergies  Meds:  Medications Prior to Admission  Medication  Sig Dispense Refill Last Dose   promethazine (PHENERGAN) 25 MG suppository Place 1 suppository (25 mg total) rectally every 6 (six) hours as needed for nausea or vomiting. 30 suppository 3 12/25/2020 at 0500   Prenatal MV & Min w/FA-DHA (PRENATAL GUMMIES PO) Take 1 tablet by mouth daily. Unknown strength       I have reviewed patient's Past Medical Hx, Surgical Hx, Family Hx, Social Hx, medications and allergies.   ROS:  Review of Systems  Constitutional:  Negative for fever.  Eyes:  Negative for blurred vision.  Gastrointestinal:  Positive for nausea and vomiting.  Neurological:  Positive for weakness and headaches.  Other systems negative  Physical Exam  Patient Vitals for the past 24 hrs:  BP Temp Temp src Pulse Resp SpO2  12/25/20 1101 126/86 -- -- 80 -- 100 %  12/25/20 1045 (!) 136/93 -- -- 85 -- 99 %  12/25/20 1030 (!) 130/94 -- -- 99 -- 99 %  12/25/20 1000 103/66 -- -- 76 14 100 %  12/25/20 0951 107/75 -- -- 82 -- 100 %  12/25/20 0950 107/75 98.3 F (36.8 C) Oral 82 15 100 %  12/25/20 0930 111/72 -- -- 87 -- 99 %  12/25/20 0915 114/80 -- -- (!) 104 -- 98 %  12/25/20 0900 118/78 -- -- 87 -- 99 %  12/25/20 0845 114/87 -- -- 81 -- 100 %  12/25/20 0834 125/78 -- -- 94 -- --  12/25/20 0806 119/85 -- -- 93 -- 100 %  12/25/20 0723 128/88 -- -- 100 -- --  12/25/20 0720 137/89 98.4 F (36.9 C) Oral 83 19 99 %   Constitutional: Well-developed, female in no acute distress.  Cardiovascular: normal rate and rhythm Respiratory: normal effort GI: Abd soft, non-tender, gravid appropriate for gestational age.   No rebound or guarding. MS: Extremities nontender, no edema, normal ROM Neurologic: Alert and oriented x 4.  GU: Neg CVAT.  PELVIC EXAM:  deferred  FHT:  Baseline 120 , moderate variability, accelerations present, no decelerations Contractions: Occasional   Labs: Preeclampsia labs ordered --/--/O POS (06/01 1027)  Imaging:    MAU Course/MDM: I have ordered labs  and IV fluids Ordered Reglan and Benadryl for headache NST reviewed, reactive  Will have RN cycle BPs Treatments in MAU included EFM, IV hydration, Reglan/Benadryl.    Assessment: Single IUP at [redacted]w[redacted]d Nausea and vomiting Headache Borderline elevated blood pressure  Plan: Report given to oncoming provider  Wynelle Bourgeois CNM, MSN Certified Nurse-Midwife 12/25/2020 7:30 AM   Headache resolved with medication given in MAU Patient was scheduled for venofer infusion this morning which she missed due to being in MAU. Given IV feraheme while here since hemoglobin is still low. Infusion completed without issue.   Elevated BP x 2 (15 minutes apart). No history of hypertension & no other elevated BPs. Preeclampsia labs normal. Reviewed with Dr. Debroah Loop. Ok to discharge home. Patient has appointment on Tuesday in the office.     A/P:  1. Pregnancy headache in third trimester  -resolved with meds  2. Nausea and vomiting during pregnancy  -continue phenergan prn  3. Anemia affecting pregnancy in third trimester  -feraheme given in MAU -msg to office for patient to have venofer infusion rescheduled  4. Elevated BP without diagnosis of hypertension  -negative preeclampsia labs & elevated less than 4 hours apart -discussed preeclampsia precautions  5. [redacted] weeks gestation of pregnancy  -keep f/u with [facility] on Tuesday    Judeth Horn, NP

## 2020-12-25 NOTE — MAU Note (Signed)
Main Lab stated there was a tech for the machine present now. Unsure of estimate on when Protein\Creatinine Ration will be finished.

## 2020-12-29 ENCOUNTER — Encounter: Payer: Self-pay | Admitting: Obstetrics

## 2020-12-29 ENCOUNTER — Other Ambulatory Visit: Payer: Self-pay | Admitting: Obstetrics

## 2020-12-29 ENCOUNTER — Ambulatory Visit (INDEPENDENT_AMBULATORY_CARE_PROVIDER_SITE_OTHER): Payer: BC Managed Care – PPO | Admitting: Obstetrics

## 2020-12-29 ENCOUNTER — Other Ambulatory Visit: Payer: Self-pay

## 2020-12-29 VITALS — BP 129/78 | HR 107 | Wt 133.3 lb

## 2020-12-29 DIAGNOSIS — O99013 Anemia complicating pregnancy, third trimester: Secondary | ICD-10-CM

## 2020-12-29 DIAGNOSIS — Z3480 Encounter for supervision of other normal pregnancy, unspecified trimester: Secondary | ICD-10-CM

## 2020-12-29 NOTE — Progress Notes (Signed)
Subjective:  Crystal Bentley is a 25 y.o. G1P0000 at [redacted]w[redacted]d being seen today for ongoing prenatal care.  She is currently monitored for the following issues for this low-risk pregnancy and has Supervision of other normal pregnancy, antepartum; Sickle cell trait (HCC); Alpha thalassemia silent carrier; No blood products; Nausea and vomiting during pregnancy; Back pain in pregnancy; Palpitations; and Anemia on their problem list.  Patient reports no complaints.  Contractions: Irregular. Vag. Bleeding: None.  Movement: Present. Denies leaking of fluid.   The following portions of the patient's history were reviewed and updated as appropriate: allergies, current medications, past family history, past medical history, past social history, past surgical history and problem list. Problem list updated.  Objective:   Vitals:   12/29/20 0923  BP: 129/78  Pulse: (!) 107  Weight: 133 lb 4.8 oz (60.5 kg)    Fetal Status:     Movement: Present     General:  Alert, oriented and cooperative. Patient is in no acute distress.  Skin: Skin is warm and dry. No rash noted.   Cardiovascular: Normal heart rate noted  Respiratory: Normal respiratory effort, no problems with respiration noted  Abdomen: Soft, gravid, appropriate for gestational age. Pain/Pressure: Present     Pelvic:  Cervical exam deferred        Extremities: Normal range of motion.  Edema: None  Mental Status: Normal mood and affect. Normal behavior. Normal judgment and thought content.   Urinalysis:      Assessment and Plan:  Pregnancy: G1P0000 at [redacted]w[redacted]d  1. Supervision of other normal pregnancy, antepartum  2. Anemia affecting pregnancy in third trimester - iron infusions ongoing.  Scheduled to receive 2nd dose this Friday    There are no diagnoses linked to this encounter. Term labor symptoms and general obstetric precautions including but not limited to vaginal bleeding, contractions, leaking of fluid and fetal movement were  reviewed in detail with the patient. Please refer to After Visit Summary for other counseling recommendations.   Return in about 1 week (around 01/05/2021) for ROB.   Brock Bad, MD  12/29/20

## 2020-12-30 ENCOUNTER — Encounter (HOSPITAL_COMMUNITY): Payer: Self-pay | Admitting: Obstetrics & Gynecology

## 2020-12-30 ENCOUNTER — Inpatient Hospital Stay (HOSPITAL_COMMUNITY)
Admission: AD | Admit: 2020-12-30 | Discharge: 2020-12-31 | Disposition: A | Discharge status: Home / Self Care | Payer: BC Managed Care – PPO | Attending: Obstetrics & Gynecology | Admitting: Obstetrics & Gynecology

## 2020-12-30 ENCOUNTER — Other Ambulatory Visit: Payer: Self-pay

## 2020-12-30 DIAGNOSIS — Z3689 Encounter for other specified antenatal screening: Secondary | ICD-10-CM

## 2020-12-30 DIAGNOSIS — Z3A38 38 weeks gestation of pregnancy: Secondary | ICD-10-CM

## 2020-12-30 DIAGNOSIS — O368130 Decreased fetal movements, third trimester, not applicable or unspecified: Secondary | ICD-10-CM | POA: Insufficient documentation

## 2020-12-30 DIAGNOSIS — O368190 Decreased fetal movements, unspecified trimester, not applicable or unspecified: Secondary | ICD-10-CM

## 2020-12-30 DIAGNOSIS — O471 False labor at or after 37 completed weeks of gestation: Secondary | ICD-10-CM

## 2020-12-30 NOTE — MAU Note (Addendum)
Pt presents to MAU with ctx every 4 mins. Pt stated she feels pressure in her lower ABD that is heavy and tightening across her belly. Pt states she hasn't felt baby move much all day, and she attempted drinking juice to get baby to move and felt a few kick, but none before applying EFM. Pt states she has had a HA that just dull. Pt denies VB, abnormal discharge, LOF, and PIH s/s.   Last SVE pt reports being 0 cm/closed GBS- Neg  Pain: 6/10 CTX, 3/10 HA

## 2020-12-31 ENCOUNTER — Inpatient Hospital Stay (HOSPITAL_BASED_OUTPATIENT_CLINIC_OR_DEPARTMENT_OTHER): Payer: BC Managed Care – PPO

## 2020-12-31 DIAGNOSIS — O368130 Decreased fetal movements, third trimester, not applicable or unspecified: Secondary | ICD-10-CM

## 2020-12-31 DIAGNOSIS — O471 False labor at or after 37 completed weeks of gestation: Secondary | ICD-10-CM

## 2020-12-31 DIAGNOSIS — Z3A38 38 weeks gestation of pregnancy: Secondary | ICD-10-CM | POA: Diagnosis not present

## 2020-12-31 DIAGNOSIS — Z3689 Encounter for other specified antenatal screening: Secondary | ICD-10-CM

## 2020-12-31 MED ORDER — PROMETHAZINE HCL 25 MG RE SUPP
25.0000 mg | Freq: Once | RECTAL | Status: AC
  Administered 2020-12-31: 25 mg via RECTAL
  Filled 2020-12-31: qty 1

## 2020-12-31 MED ORDER — ONDANSETRON 4 MG PO TBDP
4.0000 mg | ORAL_TABLET | Freq: Once | ORAL | Status: DC
  Filled 2020-12-31: qty 1

## 2020-12-31 MED ORDER — PROMETHAZINE HCL 25 MG PO TABS: 25.0000 mg | ORAL_TABLET | Freq: Once | ORAL | Status: DC

## 2020-12-31 NOTE — MAU Provider Note (Signed)
History     CSN: [id number]  Arrival date and time: 12/30/20 2322   Event Date/Time   First Provider Initiated Contact with Patient 12/31/20 0049      Chief Complaint  Patient presents with   Contractions   Crystal Bentley is a 24 y.o. G1P0 at [redacted]w[redacted]d who receives care at [facility].  She presents today for Contractions.  She reports to the nurse that she has felt decreased fetal movement since about 1700.  She states that she has felt m/m since arrival, but that it is "moving slow." She further clarifies that m/m is "not like normal."  She endorses contractions and denies vaginal bleeding or leaking.  She states that she has felt one m/m upon arrival.    OB History     Gravida  1   Para  0   Term  0   Preterm  0   AB  0   Living  0      SAB  0   IAB  0   Ectopic  0   Multiple  0   Live Births  0           Past Medical History:  Diagnosis Date   Medical history non-contributory     Past Surgical History:  Procedure Laterality Date   INGUINAL HERNIA REPAIR     4 yrs    OVARIAN CYST REMOVAL     6 yrs     Family History  Problem Relation Age of Onset   Healthy Mother    Stroke Father     Social History   Tobacco Use   Smoking status: Never   Smokeless tobacco: Never  Vaping Use   Vaping Use: Never used  Substance Use Topics   Alcohol use: Never   Drug use: Never    Allergies: No Known Allergies  Medications Prior to Admission  Medication Sig Dispense Refill Last Dose   Prenatal MV & Min w/FA-DHA (PRENATAL GUMMIES PO) Take 1 tablet by mouth daily. Unknown strength   12/30/2020   promethazine (PHENERGAN) 25 MG suppository Place 1 suppository (25 mg total) rectally every 6 (six) hours as needed for nausea or vomiting. 30 suppository 3 12/30/2020    Review of Systems  Gastrointestinal:  Positive for abdominal pain (Contractions). Negative for nausea and vomiting.  Genitourinary:  Negative for difficulty urinating, dysuria, vaginal  bleeding and vaginal discharge.  Physical Exam   Blood pressure 117/82, pulse 98, temperature 97.9 F (36.6 C), temperature source Oral, resp. rate 14, height 5\' 4"  (1.626 m), weight 60.8 kg, last menstrual period 04/09/2020, SpO2 100 %.  Physical Exam Constitutional:      Appearance: Normal appearance.  HENT:     Head: Normocephalic and atraumatic.  Eyes:     Conjunctiva/sclera: Conjunctivae normal.  Cardiovascular:     Rate and Rhythm: Normal rate.  Pulmonary:     Effort: Pulmonary effort is normal. No respiratory distress.  Musculoskeletal:        General: Normal range of motion.     Cervical back: Normal range of motion.  Skin:    General: Skin is warm and dry.  Neurological:     Mental Status: She is alert and oriented to person, place, and time.  Psychiatric:        Mood and Affect: Mood normal.        Behavior: Behavior normal.    Fetal Assessment 125 bpm, Mod Var, -Decels, +Accels Toco: Irregular ctx graphed  MAU Course  No results found for this or any previous visit (from the past 24 hour(s)). No results found.  MDM PE Labs: None EFM BPP Assessment and Plan  25 year old G1P0  SIUP at 38.4 weeks Cat I FT Decreased Fetal Movement   -Reassured that it is not abnormal for fetal movements to seem restricted while contractions are occurring. -Patient reports movement with applying of EFM -Given marker to identify fetal movement.  -NST Reactive. -Will monitor and reassess.    Cherre Robins MSN, CNM 12/31/2020, 12:49 AM   Reassessment (12:58 AM) Patient states zofran gives her a HA and requesting phenergan 25 mg ordered.   Reassessment (1:57 AM) Patient with 4 documented movements.  Provider to bedside and patient is unsure if additional movements has occurred as she had a bout of vomiting. Discussed sending for BPP to provide reassurance. -Patient questions necessity considering movement felt and FHR reassuring. -Informed that BPP would provider  further insight into fetal wellbeing as movement has been limited. -Patient informed that she can decline if desires.  States not necessary. -BPP ordered  Reassessment (2:24 AM) -BPP 8/8 -Labor precautions given. -Encouraged to call primary office or return to MAU if symptoms worsen or with the onset of new symptoms. -Discharged to home in stable condition.  Cherre Robins MSN, CNM Advanced Practice Provider, [facility] [employer]

## 2021-01-07 ENCOUNTER — Other Ambulatory Visit (HOSPITAL_COMMUNITY): Payer: Self-pay

## 2021-01-07 ENCOUNTER — Other Ambulatory Visit: Payer: Self-pay

## 2021-01-07 ENCOUNTER — Ambulatory Visit (INDEPENDENT_AMBULATORY_CARE_PROVIDER_SITE_OTHER): Payer: BC Managed Care – PPO

## 2021-01-07 VITALS — BP 119/81 | HR 92 | Wt 133.0 lb

## 2021-01-07 DIAGNOSIS — Z3480 Encounter for supervision of other normal pregnancy, unspecified trimester: Secondary | ICD-10-CM

## 2021-01-07 DIAGNOSIS — Z3A39 39 weeks gestation of pregnancy: Secondary | ICD-10-CM

## 2021-01-07 DIAGNOSIS — Z3483 Encounter for supervision of other normal pregnancy, third trimester: Secondary | ICD-10-CM

## 2021-01-07 NOTE — Progress Notes (Signed)
ROB, c/o pain, pressure and irregular contractions.

## 2021-01-07 NOTE — Patient Instructions (Signed)
AREA PEDIATRIC/FAMILY PRACTICE PHYSICIANS  [place] ([address]) [facility] Chambliss, MD; Eniola, MD; Hale, MD; Hensel, MD; McDiarmid, MD; McIntyer, MD; Neal, MD; Walden, MD [address] [place], [place] [address] [phone] Mon-Fri 8:30-12:30, 1:30-5:00 Providers come to see babies at [facility] Accepting Medicaid [facility] providers who accept newborns: Koirala, MD; Morrow, MD; Wolters, MD [address], [place], [place] [address] [phone] Mon-Fri 8:00-5:30 Babies seen by providers at [facility] Does NOT accept Medicaid Please call early in hospitalization for appointment (limited availability)  [facility] Mulberry, MD [address] [place], [place] [address] [phone] Mon, Tue, Thur, Fri 8:30-5:00, Wed 10:00-7:00 (closed 1-2pm) Babies seen by [facility] providers Accepting Medicaid Rubin - Pediatrician Rubin, MD [address], [place], [place] [address] [phone] Mon-Fri 8:30-5:00, Sat 8:30-12:00 Provider comes to see babies at [facility] Accepting Medicaid Must have been referred from current patients or contacted office prior to delivery [facility] ([facility]) Brown, MD; Chandler, MD; Ettefagh, MD; Grant, MD; Lester, MD; McCormick, MD; McQueen, MD; Prose, MD; Simha, MD; Stanley, MD; Stryffeler, NP; Tebben, NP [address], [place], [place] [address] [phone] Mon, Tue, Thur, Fri 8:30-5:30, Wed 9:30-5:30, Sat 8:30-12:30 Babies seen by [facility] providers Accepting Medicaid Only accepting infants of first-time parents or siblings of current patients Hospital discharge coordinator will make follow-up appointment Jack Amos [address], [place], [place]  [address] [phone]   Fax - [phone] [facility] [address], [place], [place]  [address] Phone - [phone]   Fax - [phone] Shilpa Gosrani [address], [place], [place]  [address] [phone]  [place] ([address]) [facility] Bates, MD; Brassfield, MD; Cooper, Cox, MD; MD; Davis, MD; Dovico, MD; Ettefaugh, MD; Little, MD; Lowe, MD; Keiffer, MD; Melvin, MD; Sumner, MD; Williams, MD [address], [place], [place] [address] [phone] Mon-Fri 8:30-5:00 (extended evenings Mon-Thur as needed), Sat-Sun 10:00-1:00 Providers come to see babies at [facility] Accepting Medicaid for families of first-time babies and families with all children in the household age 3 and under. Must register with office prior to making appointment (M-F only). [facility] Henson, NP; Knapp, MD; Lalonde, MD; Tysinger, PA [address] [place], [place] [address] [phone] Mon-Fri 8:00-5:00 Babies seen by providers at [facility] Does NOT accept Medicaid/Commercial Insurance Only [facility] ([facility])  Artis, MD; Barnes, MD; Bratton, MD; Coccaro, MD; [place] Gardner, MD; Kramer, MD; Marshall, MD; Netherton, MD; Poleto, MD; Skinner, MD [address] [place], [place] [address] [phone] Mon-Fri 8:30-5:30, Sat (Oct.-Mar.) 9:00-1:00 Babies seen by providers at [facility] Accepting Medicaid  [place] ([address]) [facility] Reid, MD; Warner, MD [address], [place], [place] [address] [phone] Mon-Fri 8:30-5:00, Sat 8:30-12:00 Providers come to see babies at [facility] Does NOT accept Medicaid [facility] Becker, PA; Hagler, MD; Scifres, PA; Sun, MD; Swayne, MD [address], [place], [place] [address] [phone] Mon-Fri 8:00-5:00 Babies seen by providers at [facility] Does NOT accept Medicaid Only accepting babies of parents who  are patients Please call early in hospitalization for appointment (limited availability)  Pediatricians Clark, MD; Frye, MD; Kelleher, MD; Mack, NP; Miller, MD; O'Keller, MD; Patterson, NP; Pudlo, MD; Puzio, MD; Thomas, MD; Tucker, MD; Twiselton, MD [address], [place], [place] [address] [phone] Mon-Fri 8:00-5:00, Sat 9:00-12:00 Providers come to see babies at [facility] Does NOT accept Medicaid  [place] ([address]) [facility] providers accepting new patients: Brake, NP; Wharton, PA [address], [place], [place] [address] [phone] Mon-Fri 8:00-5:00 Babies seen by providers at [facility] Does NOT accept Medicaid Only accepting babies of parents who are patients Please call early in hospitalization for appointment (limited availability) [facility] Gay, MD; Quinlan, MD [address] [place], [place] [address] [phone] (press 1 to schedule appointment) Mon-Fri 8:00-5:00 Providers come to see babies at [facility] Does NOT accept Medicaid [facility] Mazer, MD [address] [place], [place] [address] [phone] Mon-Fri 8:30-5:00 (lunch 12:30-1:00), extended hours by appointment only Wed 5:00-6:30 Babies seen by [facility] providers Accepting Medicaid [facility] Banks, MD; Jordan, MD; Koberlein, MD [address], [place], [place] [address] [phone] Mon-Fri 8:00-5:00 Babies seen by [facility] providers Does NOT accept Medicaid [facility] Parker, MD; Hunter, MD; Wallace, DO [address] [place], [place] [address] [phone] Mon-Fri 8:00-5:00 Babies seen by [facility] providers Does NOT accept Medicaid [facility] Brandon, PA; Brecken, PA; Christy, NP; Dees, MD; DeClaire, MD; DeWeese, MD; Hansen, NP; Mills, NP; Parrish, NP; Smoot, NP; Summer, MD; Vapne,  MD [address] [place], [place] [address] [phone] Mon-Fri 8:30-5:00, Sat 10:00-1:00 Providers come to see babies at [facility] Does NOT accept Medicaid Free prenatal information session Tuesdays at 4:45pm [facility] Bouska, MD; Gordon, PA; Jeffery, PA; Weber, PA [address] [place] [address] [phone] Mon-Fri 7:30-5:30 Babies seen by [facility] providers [facility] [address], [place], [place]  [address] [phone]   Fax - [phone]  [place] ([address] & [address]) [facility] [place] [address] [place], [place] [address] [phone] Mon-Thur 8:00-6:00 Providers come to see babies at [facility] Accepting Medicaid [facility] Anderson, NP; Badger, MD; Beal, PA; Spencer, PA [address] [place], [place] [address] [phone] Mon-Thur 7:30-7:30, Fri 7:30-4:30 Babies seen by [facility] providers Accepting Medicaid [facility] Agbuya, MD; Klett, NP; Romgoolam, MD [address], [place], [place] [address] [phone] Mon-Fri 8:30-5:00, Sat 8:30-12:00 Providers come to see babies at [facility] Accepting Medicaid Must have "Meet & Greet" appointment at office prior to delivery [facility] ([facility] McCord, MD; Wallace, MD; Wood, MD [address], [place], [place] [address] [phone] Mon-Wed 8:00-6:00, Thur-Fri 8:00-5:00, Sat 9:00-12:00 Providers come to see babies at [facility] Does NOT accept Medicaid Only accepting siblings of current patients [facility]  [address], [place], [place]  [address] [phone]   Fax - [phone] [facility] [address], [place], [place]  [address] [phone]   Fax -  [phone]  [place] ([address] & [address]) [facility] Cirigliano, DO; [place], DO [address] [place], [place] [address] [phone] Mon-Fri 7:00-5:00 Babies seen by [facility] providers Does NOT accept Medicaid [facility] Briscoe, MD; Howley, PA; Moreira, PA [address], [place], [place] [address] [phone] Mon-Fri 8:00-5:00 Babies seen by [facility] providers Accepting Medicaid [facility] Boyd, MD; Church, PA; Jones, NP; Osborn, PA [address], ,  [address] [phone] Mon-Fri 8:00-5:00 Babies seen by providers at [facility] Accepting Medicaid  [place] ([address]) [facility] [place] Wendling, DO [address] [place], [place] [address] [phone] Mon-Fri 8:00-5:00 Babies seen by [facility] providers Does NOT accept Medicaid Limited availability, please call early in hospitalization to schedule follow-up [facility] Calderon, PA; Cummings, MD; Dillard, MD; Martin, PA; Olson, MD; VanDeven, PA [address], [place], [place] [address] [phone] Mon-Fri 8:30-5:00, Sat 9:00-12:00 Babies seen by providers at [facility] Accepting Medicaid Please register online then schedule online or call office [email or web address] Hunter, NP; Kumar, MD; Martin Rogers, PA [address], [place], [place] [address] [phone] Mon-Fri 8:00-5:00 Babies seen by providers at [facility] Accepting Medicaid [facility] ([facility] Denton, MD; Kristi Fleenor, NP; West, MD [address], [place], [place] [address] [phone] Mon-Fri 8:00-5:30, Sat&Sun by appointment (phones open at  8:30) Babies seen by [facility] providers Accepting Medicaid Must be a first-time baby or sibling of current patient [facility] [place]  [address], [place], [place]  [address] [phone]   Fax - [phone]  [place] ([address]) [facility] Brown, PA; Cowen, PA; Rice, MD; Helton, PA; Spry, MD [address] [place], [place] [address] [phone] Mon-Thur 8:00-7:00, Fri 8:00-5:00, Sat 8:00-12:00, Sun 9:00-12:00 Babies seen by [facility] providers Accepting Medicaid [facility] Coe-Goins, MD; Marshall, MD; Pierre-Louis, MD [address], [place], [place] [address] [phone] Mon-Thur 8:00-5:00 Babies seen by providers at [facility] Accepting Medicaid [facility] Bratton, MD; Coe-Goins, MD; Hayes, MD; Lewis, MD; List, MD; Lott, MD; Marshall, MD; Moran, MD; O'Neal, MD; Pierre-Louis, MD; Pitonzo, MD; Scholer, MD; Spangle, MD [address] [place], [place] [address] [phone] Mon-Fri 8:00-5:30, Sat (Oct.-Mar.) 9:00-1:00 Babies seen by providers at [facility] Accepting Medicaid Must fill out new patient packet, available online at [email or web address] [facility] ([facility]) Friddle, NP; Harris, NP; Kelly, NP; Logan, MD; Melvin, PA; Poth, MD; Ramadoss, MD; Stanton, NP [address], [place], [place] [address] [phone] Mon-Thur 8:00-5:30, Fri 8:00-5:00 Babies seen by providers at [facility] Accepting Medicaid  [place] ([address]) [facility] [place], PA; La Blanca, MD; Pickard, MD; Tapia, PA [address], [place], [place] [address] [phone] Mon-Fri 8:00-5:00 Babies seen by providers at [facility] Accepting Medicaid   [place] ([address]) [facility] [place] Masneri, DO; Meyers, MD; Nelson, PA [address], [place], [place] [address] [phone] Mon-Fri 8:00-5:00 Babies seen by providers at [facility] Does NOT accept Medicaid Limited appointment availability, please call early in hospitalization  [facility] [place] Kunedd, DO; McGowen, MD [address], [place], [place] [address] [phone] Mon-Fri 8:00-5:00 Babies seen by [facility] providers Does NOT accept Medicaid [facility] [place] Cameron, MD; MacDonald, MD; Michaels, PA; Nayak, MD [address] [place] [address], [place], [place] [address] [phone] Mon-Fri 8:00-5:00 After hours clinic ([address] [place],  [address]) [phone] Mon-Fri 5:00-8:00, Sat 12:00-6:00, Sun 10:00-4:00 Babies seen by [facility] providers Accepting Medicaid [facility] [place] [address], [place], [place]  [address] [phone]   Fax - [phone]  [place] ([address]) [facility] [place] [facility] Andy, MD [address], [place], [place] [address] [phone] Mon-Fri 8:00-5:00 Babies seen by [facility] providers Does NOT accept Medicaid [facility] [place] ([facility] [place]) Eksir, MD [address], [place], [place] [address] [phone] Mon-Thur 8:00-7:00, Fri 8:00-5:00, Sat 8:00-12:00 Babies seen by providers at [facility] Accepting Medicaid - but does not have vaccinations in office (must be received elsewhere) Limited availability, please call early in hospitalization  [place] ([address]) [facility]  Charlene Flemming, MD [address], [place] [address] [phone]  Fax [phone]  [facility]  Kimberly Newton, MD, Annamarie Streilein, PA, Carla Hampton, PA [address] [place] [place], [place]  [address] [phone] [facility]  [address], [place], [place] [address] [phone] [address], [place], [place] [address] [phone] (West Office)  [facility] [address], [place], [place] [address] [phone] [facility] [address], [place], [place] [address] [phone] [facility] [address], [place], [place] [address] [phone] [facility] [address], [place], [place] [address] [phone] [facility] [address], [place], [place] [address] [phone] [facility] [address], [place], [place] [address] [phone] [facility]  [address], [place], [place] [address] [phone] Dr[facility] [address] [place] [address], [place], [place] [address] [phone] [facility] [address], [place], [place] [address] [phone] [facility] [address], [place], [place] [address] [phone]  

## 2021-01-07 NOTE — Progress Notes (Signed)
   LOW-RISK PREGNANCY OFFICE VISIT  Patient name: Crystal Bentley MRN [id number]  Date of birth: 1995-10-20 Chief Complaint:   Routine Prenatal Visit  Subjective:   Crystal Bentley is a 25 y.o. G82P0000 female at [redacted]w[redacted]d with an Estimated Date of Delivery: 01/10/21 being seen today for ongoing management of a low-risk pregnancy aeb has Supervision of other normal pregnancy, antepartum; Sickle cell trait (HCC); Alpha thalassemia silent carrier; No blood products; Nausea and vomiting during pregnancy; Back pain in pregnancy; Palpitations; and Anemia on their problem list.  Patient presents today, alone, with c/o fatigue.  She states she feels tired and unable to consume large amounts of food despite feeling hungry. Patient endorses fetal movement. Patient denies abdominal cramping , but reports some occ. contractions.  Patient denies vaginal concerns including abnormal discharge, leaking of fluid, and bleeding.  Contractions: Irregular. Vag. Bleeding: None.  Movement: Present.  Reviewed past medical,surgical, social, obstetrical and family history as well as problem list, medications and allergies.  Objective   Vitals:   01/07/21 1040  BP: 119/81  Pulse: 92  Weight: 133 lb (60.3 kg)  Body mass index is 22.83 kg/m.  Total Weight Gain:16 lb (7.258 kg)         Physical Examination:   General appearance: Well appearing, and in no distress  Mental status: Alert, oriented to person, place, and time  Skin: Warm & dry  Cardiovascular: Normal heart rate noted  Respiratory: Normal respiratory effort, no distress  Abdomen: Soft, gravid, nontender, AGA with Fundal Height: 39 cm  Pelvic: Cervical exam performed  Dilation: Closed Effacement (%): Thick Station: Ballotable Presentation: Undeterminable  Extremities: Edema: None  Fetal Status: Fetal Heart Rate (bpm): 128  Movement: Present   No results found for this or any previous visit (from the past 24 hour(s)).  Assessment & Plan:  Low-risk  pregnancy of a 25 y.o., G1P0000 at [redacted]w[redacted]d with an Estimated Date of Delivery: 01/10/21   1. Supervision of other normal pregnancy, antepartum -Anticipatory guidance for upcoming appts. -Patient to schedule next appt in 1 weeks for an in-person visit. -Exam performed and findings discussed. -Patient requests and given OOW excuse for today.   2. [redacted] weeks gestation of pregnancy -Reassured that fatigue and feelings of satiety is common in 3rd trimester. -Provided education on physiological changes in abdomen and how they contribute to feelings of satiety. -Reviewed labor onset and contractions. -Discussed IOL at 41 weeks. -Patient agreeable. -Request placed for Sun Dec 11th at [facility] -IOL/Admit orders placed    Meds: No orders of the defined types were placed in this encounter.  Labs/procedures today:  Lab Orders  No laboratory test(s) ordered today     Reviewed: Term labor symptoms and general obstetric precautions including but not limited to vaginal bleeding, contractions, leaking of fluid and fetal movement were reviewed in detail with the patient.  All questions were answered.  Follow-up: No follow-ups on file.  No orders of the defined types were placed in this encounter.  Cherre Robins MSN, CNM 01/07/2021

## 2021-01-08 ENCOUNTER — Encounter (HOSPITAL_COMMUNITY)
Admission: RE | Admit: 2021-01-08 | Discharge: 2021-01-08 | Disposition: A | Discharge status: Home / Self Care | Payer: BC Managed Care – PPO | Source: Ambulatory Visit | Attending: Obstetrics and Gynecology | Admitting: Obstetrics and Gynecology

## 2021-01-08 DIAGNOSIS — O99019 Anemia complicating pregnancy, unspecified trimester: Secondary | ICD-10-CM | POA: Insufficient documentation

## 2021-01-08 DIAGNOSIS — D509 Iron deficiency anemia, unspecified: Secondary | ICD-10-CM | POA: Diagnosis present

## 2021-01-08 MED ORDER — SODIUM CHLORIDE 0.9 % IV SOLN
500.0000 mg | Freq: Once | INTRAVENOUS | Status: DC
  Filled 2021-01-08: qty 25

## 2021-01-08 MED ORDER — SODIUM CHLORIDE 0.9 % IV SOLN
510.0000 mg | Freq: Once | INTRAVENOUS | Status: AC
  Administered 2021-01-08: 510 mg via INTRAVENOUS
  Filled 2021-01-08: qty 510

## 2021-01-10 ENCOUNTER — Other Ambulatory Visit: Payer: Self-pay

## 2021-01-10 ENCOUNTER — Encounter (HOSPITAL_COMMUNITY): Payer: Self-pay | Admitting: Obstetrics and Gynecology

## 2021-01-10 ENCOUNTER — Inpatient Hospital Stay (HOSPITAL_COMMUNITY)
Admission: AD | Admit: 2021-01-10 | Discharge: 2021-01-11 | Disposition: A | Discharge status: Home / Self Care | Payer: BC Managed Care – PPO | Source: Home / Self Care | Attending: Obstetrics and Gynecology | Admitting: Obstetrics and Gynecology

## 2021-01-10 DIAGNOSIS — Z3A40 40 weeks gestation of pregnancy: Secondary | ICD-10-CM | POA: Insufficient documentation

## 2021-01-10 DIAGNOSIS — O480 Post-term pregnancy: Secondary | ICD-10-CM | POA: Insufficient documentation

## 2021-01-10 DIAGNOSIS — O471 False labor at or after 37 completed weeks of gestation: Secondary | ICD-10-CM | POA: Insufficient documentation

## 2021-01-10 DIAGNOSIS — O479 False labor, unspecified: Secondary | ICD-10-CM

## 2021-01-10 NOTE — MAU Note (Addendum)
Pt presents to MAU with ctx and VB with wiping, red blood. Pt noticed the bleeding while wiping when she went to the bathroom 2 hours ago and twice after that. Pt reports bleeding is mixed with mucous. Pt denies LOF, DFM, PIH s/s, and no recent intercourse. Pt was closed in the office Thursday.   GBS neg  Pain 5/10 ctx back pain

## 2021-01-11 ENCOUNTER — Encounter (HOSPITAL_COMMUNITY): Payer: Self-pay | Admitting: Family Medicine

## 2021-01-11 ENCOUNTER — Inpatient Hospital Stay (HOSPITAL_COMMUNITY)
Admission: AD | Admit: 2021-01-11 | Discharge: 2021-01-14 | DRG: 807 | Disposition: A | Discharge status: Home / Self Care | Payer: BC Managed Care – PPO | Attending: Obstetrics and Gynecology | Admitting: Obstetrics and Gynecology

## 2021-01-11 DIAGNOSIS — Z20822 Contact with and (suspected) exposure to covid-19: Secondary | ICD-10-CM | POA: Diagnosis present

## 2021-01-11 DIAGNOSIS — D509 Iron deficiency anemia, unspecified: Secondary | ICD-10-CM | POA: Diagnosis present

## 2021-01-11 DIAGNOSIS — O99893 Other specified diseases and conditions complicating puerperium: Secondary | ICD-10-CM | POA: Diagnosis not present

## 2021-01-11 DIAGNOSIS — Z3A40 40 weeks gestation of pregnancy: Secondary | ICD-10-CM | POA: Diagnosis not present

## 2021-01-11 DIAGNOSIS — O9902 Anemia complicating childbirth: Secondary | ICD-10-CM | POA: Diagnosis present

## 2021-01-11 DIAGNOSIS — O26893 Other specified pregnancy related conditions, third trimester: Secondary | ICD-10-CM | POA: Diagnosis present

## 2021-01-11 DIAGNOSIS — D573 Sickle-cell trait: Secondary | ICD-10-CM | POA: Diagnosis present

## 2021-01-11 DIAGNOSIS — O4292 Full-term premature rupture of membranes, unspecified as to length of time between rupture and onset of labor: Principal | ICD-10-CM | POA: Diagnosis present

## 2021-01-11 DIAGNOSIS — R030 Elevated blood-pressure reading, without diagnosis of hypertension: Secondary | ICD-10-CM | POA: Diagnosis not present

## 2021-01-11 DIAGNOSIS — D563 Thalassemia minor: Secondary | ICD-10-CM | POA: Diagnosis present

## 2021-01-11 DIAGNOSIS — O480 Post-term pregnancy: Secondary | ICD-10-CM | POA: Diagnosis not present

## 2021-01-11 DIAGNOSIS — R002 Palpitations: Secondary | ICD-10-CM | POA: Diagnosis present

## 2021-01-11 DIAGNOSIS — O4202 Full-term premature rupture of membranes, onset of labor within 24 hours of rupture: Secondary | ICD-10-CM | POA: Diagnosis not present

## 2021-01-11 DIAGNOSIS — Z3480 Encounter for supervision of other normal pregnancy, unspecified trimester: Secondary | ICD-10-CM

## 2021-01-11 LAB — CBC
HCT: 34.7 % — ABNORMAL LOW (ref 36.0–46.0)
Hemoglobin: 11 g/dL — ABNORMAL LOW (ref 12.0–15.0)
MCH: 25.2 pg — ABNORMAL LOW (ref 26.0–34.0)
MCHC: 31.7 g/dL (ref 30.0–36.0)
MCV: 79.6 fL — ABNORMAL LOW (ref 80.0–100.0)
Platelets: 447 10*3/uL — ABNORMAL HIGH (ref 150–400)
RBC: 4.36 MIL/uL (ref 3.87–5.11)
RDW: 21.7 % — ABNORMAL HIGH (ref 11.5–15.5)
WBC: 13.3 10*3/uL — ABNORMAL HIGH (ref 4.0–10.5)
nRBC: 0 % (ref 0.0–0.2)

## 2021-01-11 LAB — POCT FERN TEST: POCT Fern Test: POSITIVE

## 2021-01-11 LAB — RESP PANEL BY RT-PCR (FLU A&B, COVID) ARPGX2
Influenza A by PCR: NEGATIVE
Influenza B by PCR: NEGATIVE
SARS Coronavirus 2 by RT PCR: NEGATIVE

## 2021-01-11 LAB — AMNISURE RUPTURE OF MEMBRANE (ROM) NOT AT ARMC: Amnisure ROM: POSITIVE

## 2021-01-11 MED ORDER — FENTANYL-BUPIVACAINE-NACL 0.5-0.125-0.9 MG/250ML-% EP SOLN
12.0000 mL/h | EPIDURAL | Status: DC | PRN
  Administered 2021-01-12 (×2): 12 mL/h via EPIDURAL
  Filled 2021-01-11 (×2): qty 250

## 2021-01-11 MED ORDER — FENTANYL CITRATE (PF) 100 MCG/2ML IJ SOLN
50.0000 ug | INTRAMUSCULAR | Status: DC | PRN
  Administered 2021-01-11 – 2021-01-12 (×3): 100 ug via INTRAVENOUS
  Filled 2021-01-11 (×3): qty 2

## 2021-01-11 MED ORDER — TERBUTALINE SULFATE 1 MG/ML IJ SOLN: 0.2500 mg | Freq: Once | INTRAMUSCULAR | Status: DC | PRN

## 2021-01-11 MED ORDER — OXYTOCIN-SODIUM CHLORIDE 30-0.9 UT/500ML-% IV SOLN
2.5000 [IU]/h | INTRAVENOUS | Status: DC
  Administered 2021-01-12: 2.5 [IU]/h via INTRAVENOUS
  Filled 2021-01-11: qty 500

## 2021-01-11 MED ORDER — ACETAMINOPHEN 325 MG PO TABS
650.0000 mg | ORAL_TABLET | ORAL | Status: DC | PRN
  Filled 2021-01-11: qty 2

## 2021-01-11 MED ORDER — LACTATED RINGERS IV SOLN
500.0000 mL | INTRAVENOUS | Status: DC | PRN
  Administered 2021-01-12: 500 mL via INTRAVENOUS

## 2021-01-11 MED ORDER — LIDOCAINE HCL (PF) 1 % IJ SOLN: 30.0000 mL | INTRAMUSCULAR | Status: DC | PRN

## 2021-01-11 MED ORDER — DIPHENHYDRAMINE HCL 50 MG/ML IJ SOLN: 12.5000 mg | INTRAMUSCULAR | Status: DC | PRN

## 2021-01-11 MED ORDER — SOD CITRATE-CITRIC ACID 500-334 MG/5ML PO SOLN: 30.0000 mL | ORAL | Status: DC | PRN

## 2021-01-11 MED ORDER — PHENYLEPHRINE 40 MCG/ML (10ML) SYRINGE FOR IV PUSH (FOR BLOOD PRESSURE SUPPORT)
80.0000 ug | PREFILLED_SYRINGE | INTRAVENOUS | Status: DC | PRN
  Administered 2021-01-12: 80 ug via INTRAVENOUS
  Filled 2021-01-11 (×2): qty 10

## 2021-01-11 MED ORDER — PROMETHAZINE HCL 12.5 MG RE SUPP
12.5000 mg | Freq: Once | RECTAL | Status: AC
  Administered 2021-01-12: 12.5 mg via RECTAL
  Filled 2021-01-11: qty 1

## 2021-01-11 MED ORDER — OXYTOCIN-SODIUM CHLORIDE 30-0.9 UT/500ML-% IV SOLN
1.0000 m[IU]/min | INTRAVENOUS | Status: DC
  Filled 2021-01-11: qty 500

## 2021-01-11 MED ORDER — MISOPROSTOL 25 MCG QUARTER TABLET: 25.0000 ug | ORAL_TABLET | ORAL | Status: DC | PRN

## 2021-01-11 MED ORDER — PHENYLEPHRINE 40 MCG/ML (10ML) SYRINGE FOR IV PUSH (FOR BLOOD PRESSURE SUPPORT)
80.0000 ug | PREFILLED_SYRINGE | INTRAVENOUS | Status: AC | PRN
  Administered 2021-01-12 (×3): 80 ug via INTRAVENOUS

## 2021-01-11 MED ORDER — ONDANSETRON HCL 4 MG/2ML IJ SOLN: 4.0000 mg | Freq: Four times a day (QID) | INTRAMUSCULAR | Status: DC | PRN

## 2021-01-11 MED ORDER — EPHEDRINE 5 MG/ML INJ: 10.0000 mg | INTRAVENOUS | Status: DC | PRN

## 2021-01-11 MED ORDER — OXYTOCIN BOLUS FROM INFUSION
333.0000 mL | Freq: Once | INTRAVENOUS | Status: AC
  Administered 2021-01-12: 333 mL via INTRAVENOUS

## 2021-01-11 MED ORDER — LACTATED RINGERS IV SOLN
500.0000 mL | Freq: Once | INTRAVENOUS | Status: AC
  Administered 2021-01-12: 500 mL via INTRAVENOUS

## 2021-01-11 MED ORDER — EPHEDRINE 5 MG/ML INJ
10.0000 mg | INTRAVENOUS | Status: DC | PRN
  Filled 2021-01-11: qty 5

## 2021-01-11 MED ORDER — LACTATED RINGERS IV SOLN: INTRAVENOUS | Status: DC

## 2021-01-11 NOTE — MAU Provider Note (Signed)
Event Date/Time   First Provider Initiated Contact with Patient 01/11/21 1609       S: Ms. Crystal Bentley is a 25 y.o. G1P0000 at [redacted]w[redacted]d  who presents to MAU today complaining of leaking of fluid since 0800. She reports that she had some liquid on the sheets this morning, denies any LOF since then. She denies vaginal bleeding. She endorses contractions. She reports normal fetal movement.  O: BP 111/79   Pulse (!) 103   Temp 98.5 F (36.9 C) (Oral)   Resp 17   LMP 04/09/2020   SpO2 100%  GENERAL: Well-developed, well-nourished female in no acute distress.  HEAD: Normocephalic, atraumatic.  CHEST: Normal effort of breathing, regular heart rate ABDOMEN: Soft, nontender, gravid PELVIC: Normal external female genitalia. Vagina is pink and rugated. Cervix with normal contour, no lesions. Normal discharge.  Neg pooling.   Cervical exam:  Dilation: 1.5 Effacement (%): 70 Cervical Position: Posterior Station: -2 Presentation: Vertex Exam by:: Luna Kitchens, CNM   Fetal Monitoring: Baseline: 120 Variability: mod Accelerations: present Decelerations: absent Contractions: q 2-3  Results for orders placed or performed during the hospital encounter of 01/11/21 (from the past 24 hour(s))  POCT fern test     Status: None   Collection Time: 01/11/21  4:48 PM  Result Value Ref Range   POCT Fern Test Positive = ruptured amniotic membanes   Amnisure rupture of membrane (rom)not at [facility]     Status: None   Collection Time: 01/11/21  5:00 PM  Result Value Ref Range   Amnisure ROM POSITIVE      A: SIUP at [redacted]w[redacted]d  SROM  P: Admit to labor and delivery   Crystal Bentley [name] 01/11/2021, 6:09 PM

## 2021-01-11 NOTE — MAU Provider Note (Signed)
S: Patient is here for RN labor evaluation. Fetal tracing, vital signs, & chart reviewed   Patient reports contractions that spaced out by the time she got to MAU.  She also reported some mucousy blood in toilet as well pink tinged discharged when she wiped (showed photo of it). Denies any bright red blood/frank blood.  O:  Vitals:   01/10/21 2246 01/11/21 0018  BP: 122/83 126/82  Pulse: (!) 104 (!) 102  Resp: 18 18  Temp: 97.8 F (36.6 C)   TempSrc: Oral   SpO2: 100% 100%   No results found for this or any previous visit (from the past 24 hour(s)).  Dilation: Fingertip Effacement (%): 20 Cervical Position: Posterior Station: -3 Presentation: Vertex Exam by:: Santiago Bur, RN   FHR: 125-130 bpm, Mod Var, no Decels, 15x15 Accels UC: 4-6   A: 1. False labor    Cervix fingertip in G1. Contracting every 4-6 min. Unchanged cervix after ~2 hrs.  Bloody show and pink tinged discharge when wiped. No frank blood. Ok to discharge. Discussed with Dr. Para March  P:  RN to discharge home in stable condition with return precautions & fetal kick counts  Warner Mccreedy, MD @T @ 12:32 AM

## 2021-01-11 NOTE — MAU Note (Signed)
...  Crystal Bentley is a 25 y.o. at [redacted]w[redacted]d here in MAU reporting: She woke up with fluid on her pillow that she has placed in between her legs. She has not had a continual leaking of fluids since this episode. She states she is unaware of the color of the fluid as her pants and pillow are colored. She states her CTX began early this morning. She states around noon they became more intense and are currently 3 minutes apart. +FM. Endorses bloody show.   Was seen here in MAU yesterday evening around 1030 for CTX and bloody show.   GBS-.  Pain score:  7/10 abdomen  Vitals:   01/11/21 1541  BP: 129/77  Pulse: 99  Resp: 17  Temp: 98.5 F (36.9 C)  SpO2: 100%     FHT: 120 initial external Lab orders placed from triage:  MAU Labor Eval

## 2021-01-11 NOTE — H&P (Signed)
OBSTETRIC ADMISSION HISTORY AND PHYSICAL  Crystal Bentley is a 25 y.o. female G1P0000 with IUP at [redacted]w[redacted]d by 8 week ultrasound presenting for SROM. SROM around 0800, unsure of color. Fern positive and Amnisure positive in the MAU, admitted to L&D for further management. She reports +FMs, no blurry vision, headaches, peripheral edema, or RUQ pain.  She plans on breast feeding. She is planning for an IUD for birth control postpartum.  She received her prenatal care at [facility].   Dating: By 8 week [place] --->  Estimated Date of Delivery: 01/10/21  Sono:   12/08/20 @[redacted]w[redacted]d , normal anatomy, cephalic presentation, 2731g, EFW  Prenatal History/Complications:  Alpha thalassemia carrier Sickle cell trait carrier  Iron deficiency anemia (s/p IV iron x2 doses)  Past Medical History: Past Medical History:  Diagnosis Date   Medical history non-contributory     Past Surgical History: Past Surgical History:  Procedure Laterality Date   INGUINAL HERNIA REPAIR     5 yrs    OVARIAN CYST REMOVAL     7 yrs     Obstetrical History: OB History     Gravida  1   Para  0   Term  0   Preterm  0   AB  0   Living  0      SAB  0   IAB  0   Ectopic  0   Multiple  0   Live Births  0           Social History Social History   Socioeconomic History   Marital status: Married    Spouse name: Not on file   Number of children: Not on file   Years of education: Not on file   Highest education level: Not on file  Occupational History   Not on file  Tobacco Use   Smoking status: Never   Smokeless tobacco: Never  Vaping Use   Vaping Use: Never used  Substance and Sexual Activity   Alcohol use: Never   Drug use: Never   Sexual activity: Yes    Birth control/protection: None  Other Topics Concern   Not on file  Social History Narrative   Not on file   Social Determinants of Health   Financial Resource Strain: Not on file  Food Insecurity: No Food Insecurity    Worried About Running Out of Food in the Last Year: Never true   Ran Out of Food in the Last Year: Never true  Transportation Needs: No Transportation Needs   Lack of Transportation (Medical): No   Lack of Transportation (Non-Medical): No  Physical Activity: Not on file  Stress: Not on file  Social Connections: Not on file    Family History: Family History  Problem Relation Age of Onset   Healthy Mother    Stroke Father     Allergies: No Known Allergies  Medications Prior to Admission  Medication Sig Dispense Refill Last Dose   Prenatal MV & Min w/FA-DHA (PRENATAL GUMMIES PO) Take 1 tablet by mouth daily. Unknown strength   01/10/2021   promethazine (PHENERGAN) 25 MG suppository Place 1 suppository (25 mg total) rectally every 6 (six) hours as needed for nausea or vomiting. 30 suppository 3 01/11/2021     Review of Systems  All systems reviewed and negative except as stated in HPI  Blood pressure 120/84, pulse 92, temperature 97.7 F (36.5 C), temperature source Oral, resp. rate 18, height 5\' 4"  (1.626 m), weight 60.4 kg, last menstrual period 04/09/2020,  SpO2 100 %.  General appearance: alert, cooperative, and no distress Lungs: normal work of breathing on room air  Heart: normal rate, warm and well perfused  Abdomen: soft, non-tender, gravid  Extremities: no LE edema or calf tenderness to palpation   Presentation:  Cephalic Fetal monitoring: Baseline 115, moderate variability, + accels, no decels  Uterine activity: Every 3 minutes  Dilation: 2.5 Effacement (%): 60 Station: -3 Exam by:: Dr. Mathis Fare   Prenatal labs: ABO, Rh: --/--/O POS (06/01 1027) Antibody: NEG (06/01 1027) Rubella: 11.00 (05/24 1601) RPR: Non Reactive (09/07 1114)  HBsAg: Negative (05/24 1601)  HIV: Non Reactive (09/07 1114)  GBS: Negative/-- (11/11 1006)  2 hr Glucola normal  Genetic screening - LR NIPS, AFP neg, alpha thal and [place] trait  Anatomy US normal   Prenatal Transfer Tool   Maternal Diabetes: No Genetic Screening: LR NIPS, AFP neg, alpha thal and [place] trait  Maternal Ultrasounds/Referrals: Normal Fetal Ultrasounds or other Referrals:  None Maternal Substance Abuse:  No Significant Maternal Medications:  None Significant Maternal Lab Results: Group B Strep negative  Results for orders placed or performed during the hospital encounter of 01/11/21 (from the past 24 hour(s))  POCT fern test   Collection Time: 01/11/21  4:48 PM  Result Value Ref Range   POCT Fern Test Positive = ruptured amniotic membanes   Amnisure rupture of membrane (rom)not at [facility]   Collection Time: 01/11/21  5:00 PM  Result Value Ref Range   Amnisure ROM POSITIVE   Resp Panel by RT-PCR (Flu A&B, Covid) Nasopharyngeal Swab   Collection Time: 01/11/21  5:57 PM   Specimen: Nasopharyngeal Swab; Nasopharyngeal(NP) swabs in vial transport medium  Result Value Ref Range   SARS Coronavirus 2 by RT PCR NEGATIVE NEGATIVE   Influenza A by PCR NEGATIVE NEGATIVE   Influenza B by PCR NEGATIVE NEGATIVE  CBC   Collection Time: 01/11/21  6:10 PM  Result Value Ref Range   WBC 13.3 (H) 4.0 - 10.5 K/uL   RBC 4.36 3.87 - 5.11 MIL/uL   Hemoglobin 11.0 (L) 12.0 - 15.0 g/dL   HCT 30.1 (L) 60.1 - 09.3 %   MCV 79.6 (L) 80.0 - 100.0 fL   MCH 25.2 (L) 26.0 - 34.0 pg   MCHC 31.7 30.0 - 36.0 g/dL   RDW 23.5 (H) 57.3 - 22.0 %   Platelets 447 (H) 150 - 400 K/uL   nRBC 0.0 0.0 - 0.2 %    Patient Active Problem List   Diagnosis Date Noted   Indication for care or intervention in labor or delivery 01/11/2021   Anemia 12/24/2020   Palpitations 10/31/2020   Back pain in pregnancy 10/28/2020   Nausea and vomiting during pregnancy 10/14/2020   No blood products 09/30/2020   Sickle cell trait (HCC) 07/29/2020   Alpha thalassemia silent carrier 07/29/2020   Supervision of other normal pregnancy, antepartum 06/30/2020   Assessment/Plan:  Crystal Bentley is a 25 y.o. G1P0000 at [redacted]w[redacted]d here for SROM.    #Labor: Progressing since last check. Contracting well on her own painfully. Will continue expectant management and reassess in 4 hours. Discussed augmentation as needed.  #Pain: PRN; planning for epidural when ready  #FWB: Cat 1  #ID:  GBS neg #MOF: Breast #MOC: IUD outpatient #Circ:  Desires   #Iron deficiency anemia: Last hemoglobin 8.4 prior to admission. S/p iron infusion x2 and hemoglobin on admission 11.0.   #Jehovah's Witness: Discussed with patient. She confirmed that she does not want to  receive any blood products and understands the potential consequences of this. Blood product refusal form reviewed with patient and signed.    Worthy Rancher, MD  01/11/2021, 8:38 PM

## 2021-01-12 ENCOUNTER — Inpatient Hospital Stay (HOSPITAL_COMMUNITY): Payer: BC Managed Care – PPO | Admitting: Anesthesiology

## 2021-01-12 ENCOUNTER — Encounter (HOSPITAL_COMMUNITY): Payer: Self-pay | Admitting: Family Medicine

## 2021-01-12 DIAGNOSIS — Z3A40 40 weeks gestation of pregnancy: Secondary | ICD-10-CM

## 2021-01-12 DIAGNOSIS — O9902 Anemia complicating childbirth: Secondary | ICD-10-CM

## 2021-01-12 DIAGNOSIS — O4202 Full-term premature rupture of membranes, onset of labor within 24 hours of rupture: Secondary | ICD-10-CM

## 2021-01-12 DIAGNOSIS — O480 Post-term pregnancy: Secondary | ICD-10-CM

## 2021-01-12 LAB — RPR: RPR Ser Ql: NONREACTIVE

## 2021-01-12 MED ORDER — LIDOCAINE HCL (PF) 1 % IJ SOLN
INTRAMUSCULAR | Status: DC | PRN
  Administered 2021-01-12 (×2): 5 mL via EPIDURAL

## 2021-01-12 MED ORDER — OXYTOCIN-SODIUM CHLORIDE 30-0.9 UT/500ML-% IV SOLN
1.0000 m[IU]/min | INTRAVENOUS | Status: DC
  Administered 2021-01-12: 2 m[IU]/min via INTRAVENOUS

## 2021-01-12 MED ORDER — SODIUM CHLORIDE 0.9 % IV SOLN
12.5000 mg | Freq: Once | INTRAVENOUS | Status: AC
  Administered 2021-01-12: 12.5 mg via INTRAVENOUS
  Filled 2021-01-12: qty 0.5

## 2021-01-12 NOTE — Progress Notes (Signed)
Patient has been pushing for 2 hours. Requesting to take a break. Will reassess in 30 minutes.   Evalina Field, MD

## 2021-01-12 NOTE — Progress Notes (Addendum)
Patient ID: Crystal Bentley, female   DOB: 03/30/1995, 25 y.o.   MRN: [id number]  Labor Progress Note Tashonna Descoteaux is a 25 y.o. G1P0000 at [redacted]w[redacted]d presented for SROM S: Pt is having some nausea that is bothersome for her, but notes suppositories have been helpful for her at home (zofran gives her headaches). Also notes that her contractions are becoming increasingly painful and would like to have an epidural.   O:  BP 126/86   Pulse 96   Temp 97.7 F (36.5 C) (Oral)   Resp 18   Ht 5\' 4"  (1.626 m)   Wt 60.4 kg   LMP 04/09/2020   SpO2 100%   BMI 22.86 kg/m  EFM: Moderate variability with +accels.   CVE: Dilation: 2.5 Effacement (%): 60 Cervical Position: Posterior Station: -3 Presentation: Vertex Exam by:: Dr. [email or web address]   A&P: 25 y.o. G1P0000 [redacted]w[redacted]d here for SROM #Labor: Progressing well. Continue expectant management. Will reassess in 3-4 hours. #Pain: Will receive epidural and continue IV pain while waiting placement. #FWB: Cat 1 #GBS negative #Iron deficiency anemia: Received 2x iron infusions with Hgb improved from 8.4 to 11.0 on admission. [redacted]w[redacted]d Witness: Pt does not want any blood products and has had discussions about the potential consequences of this. Has signed blood product refusal.  #DGLOVFI'E, Medical Student 12:07 AM  GME ATTESTATION:  I saw and evaluated the patient. I agree with the findings and the plan of care as documented in the student's note.  Judieth Keens, MD, MPH OB Fellow, Faculty Practice [facility], Center for [facility]

## 2021-01-12 NOTE — Progress Notes (Signed)
Labor Progress Note Crystal Bentley is a 25 y.o. G1P0000 at [redacted]w[redacted]d presented for SROM  S: Still having nausea. Received phenergan suppository x1 around 7 am and IV phenergan x1 around 10 AM. She states it is helping some.  O:  BP 118/79   Pulse 95   Temp 97.7 F (36.5 C) (Oral)   Resp 16   Ht 5\' 4"  (1.626 m)   Wt 60.4 kg   LMP 04/09/2020   SpO2 100%   BMI 22.86 kg/m  EFM: 120 bpm/moderate/+accels, occasional late decels that resolved after repositioning and a fluid bolus  CVE: Dilation: 8 Effacement (%): 100 Cervical Position: Posterior Station: -2 Presentation: Vertex Exam by:: [email or web address], RN   A&P: 25 y.o. G1P0000 [redacted]w[redacted]d SIUP #Labor: Progressing well. CVE improved from 6 to 8cm at last check. Pitocin @ 10, continue titrating until complete. #Pain: Epidural #FWB: Cat 2 #GBS negative  #IDA: Hgb11.0 on admission. Pt is Jehovah's Witness and has signed blood product refusal. Plan for reassessment of Hgb and blood loss after delivery.   [redacted]w[redacted]d, MD 11:26 AM

## 2021-01-12 NOTE — Lactation Note (Signed)
This note was copied from a baby's chart. Lactation Consultation Note Baby less than hr old. Mom had the baby on the breast when LC entered rm. Mom's feeding choice if Breast/formula. Mom is asking how long the baby eats and not get anything. Explained the baby is probably getting good colostrum. Discussed mom's mature milk comes in 3-5 days and how important colostrum is. Mom received phone call from her mom. Mom will be seen on MBU.  Patient Name: Crystal Bentley WNUUV'O Date: 01/12/2021 Reason for consult: L&D Initial assessment;Primapara;Term Age:76 hours  Maternal Data Does the patient have breastfeeding experience prior to this delivery?: No  Feeding    LATCH Score Latch: Grasps breast easily, tongue down, lips flanged, rhythmical sucking.  Audible Swallowing: None  Type of Nipple: Everted at rest and after stimulation  Comfort (Breast/Nipple): Soft / non-tender  Hold (Positioning): Assistance needed to correctly position infant at breast and maintain latch.  LATCH Score: 7   Lactation Tools Discussed/Used    Interventions Interventions: Breast feeding basics reviewed;Assisted with latch;Skin to skin;Breast compression  Discharge    Consult Status Consult Status: Follow-up Date: 01/13/21 Follow-up type: In-patient    Charyl Dancer 01/12/2021, 10:02 PM

## 2021-01-12 NOTE — Progress Notes (Signed)
Labor Progress Note Shuntae Selinger is a 25 y.o. G1P0000 at [redacted]w[redacted]d who presented for PROM and early labor.   S: Doing well. Comfortable with epidural. Having some nausea. No other concerns at this time.  O:  BP 118/79   Pulse 95   Temp 97.7 F (36.5 C) (Oral)   Resp 16   Ht 5\' 4"  (1.626 m)   Wt 60.4 kg   LMP 04/09/2020   SpO2 100%   BMI 22.86 kg/m   EFM: Baseline 120 bpm, moderate variability, + accels, no decels Toco: Every 2-3 minutes  CVE: Dilation: 8 Effacement (%): 100 Cervical Position: Posterior Station: -2 Presentation: Vertex Exam by:: [email or web address], RN   A&P: 25 y.o. G1P0000 [redacted]w[redacted]d   #Labor: Progressing well and making steady cervical change. Contraction pattern is adequate. Will continue Pitocin at current rate and reassess in 2-3 hours or when feeling pressure. #Pain: Epidural  #FWB: Cat 1  #GBS negative  [redacted]w[redacted]d, MD 11:44 AM

## 2021-01-12 NOTE — Anesthesia Preprocedure Evaluation (Signed)
Anesthesia Evaluation  Patient identified by MRN, date of birth, ID band Patient awake    Reviewed: Allergy & Precautions, Patient's Chart, lab work & pertinent test results  Airway Mallampati: II  TM Distance: >3 FB Neck ROM: Full    Dental no notable dental hx.    Pulmonary neg pulmonary ROS,    Pulmonary exam normal        Cardiovascular negative cardio ROS Normal cardiovascular exam     Neuro/Psych negative neurological ROS  negative psych ROS   GI/Hepatic negative GI ROS, Neg liver ROS,   Endo/Other  negative endocrine ROS  Renal/GU negative Renal ROS  negative genitourinary   Musculoskeletal negative musculoskeletal ROS (+)   Abdominal   Peds  Hematology  (+) anemia ,   Anesthesia Other Findings   Reproductive/Obstetrics (+) Pregnancy                             Anesthesia Physical Anesthesia Plan  ASA: 2  Anesthesia Plan: Epidural   Post-op Pain Management:    Induction:   PONV Risk Score and Plan:   Airway Management Planned: Natural Airway  Additional Equipment:   Intra-op Plan:   Post-operative Plan:   Informed Consent: I have reviewed the patients History and Physical, chart, labs and discussed the procedure including the risks, benefits and alternatives for the proposed anesthesia with the patient or authorized representative who has indicated his/her understanding and acceptance.       Plan Discussed with: Anesthesiologist  Anesthesia Plan Comments:         Anesthesia Quick Evaluation

## 2021-01-12 NOTE — Progress Notes (Signed)
Labor Progress Note Dannae Routzahn is a 25 y.o. G1P0000 at [redacted]w[redacted]d presented for PROM and early labor  S:  Doing well. Starting to feel pressure but not feeling urge to push. Still endorsing nausea  O:  BP (!) 141/91   Pulse (!) 106   Temp 98.4 F (36.9 C) (Oral)   Resp 15   Ht 5\' 4"  (1.626 m)   Wt 60.4 kg   LMP 04/09/2020   SpO2 100%   BMI 22.86 kg/m  EFM: 125 bpm/mod/+accels, occasional early decels  Toco: Every 1-2 minutes  CVE: Dilation: Lip/rim Effacement (%): 100 Cervical Position: Posterior Station: 0 Presentation: Vertex Exam by:: [email or web address], RN   A&P: 25 y.o. G1P0000 [redacted]w[redacted]d SIUP #Labor: Progressing well and nearly complete. Will continue pitocin at current dose and labor down.  #Pain: Epidural #FWB: Cat 1 #GBS negative  [redacted]w[redacted]d, MD 3:29 PM

## 2021-01-12 NOTE — Progress Notes (Signed)
Pushing well. Progressed to +2 station with 1 hour of pushing. Will continue to push and reassess in 1 hour. Variable decels with contractions. Appropriate fetal recovery between contractions. Will continue to monitor closely.  Evalina Field, MD

## 2021-01-12 NOTE — Anesthesia Procedure Notes (Signed)
Epidural Patient location during procedure: OB Start time: 01/12/2021 12:41 AM End time: 01/12/2021 12:50 AM  Staffing Anesthesiologist: Mal Amabile, MD Performed: anesthesiologist   Preanesthetic Checklist Completed: patient identified, IV checked, site marked, risks and benefits discussed, surgical consent, monitors and equipment checked, pre-op evaluation and timeout performed  Epidural Patient position: sitting Prep: DuraPrep and site prepped and draped Patient monitoring: continuous pulse ox and blood pressure Approach: midline Location: L3-L4 Injection technique: LOR air  Needle:  Needle type: Tuohy  Needle gauge: 17 G Needle length: 9 cm and 9 Needle insertion depth: 4 cm Catheter type: closed end flexible Catheter size: 19 Gauge Catheter at skin depth: 9 cm Test dose: negative and Other  Assessment Events: blood not aspirated, injection not painful, no injection resistance, no paresthesia and negative IV test  Additional Notes Patient identified. Risks and benefits discussed including failed block, incomplete  Pain control, post dural puncture headache, nerve damage, paralysis, blood pressure Changes, nausea, vomiting, reactions to medications-both toxic and allergic and post Partum back pain. All questions were answered. Patient expressed understanding and wished to proceed. Sterile technique was used throughout procedure. Epidural site was Dressed with sterile barrier dressing. No paresthesias, signs of intravascular injection Or signs of intrathecal spread were encountered.  Patient was more comfortable after the epidural was dosed. Please see RN's note for documentation of vital signs and FHR which are stable. Reason for block:procedure for pain

## 2021-01-12 NOTE — Progress Notes (Signed)
Crystal Bentley is a 25 y.o. G1P0000 at [redacted]w[redacted]d admitted for PROM and early labor  Subjective: Comfortable with epidural  Objective: BP 129/86   Pulse 84   Temp (!) 97.3 F (36.3 C) (Axillary)   Resp 18   Ht 5\' 4"  (1.626 m)   Wt 60.4 kg   LMP 04/09/2020   SpO2 100%   BMI 22.86 kg/m  No intake/output data recorded. Total I/O In: 0  Out: 525 [Urine:525]  FHT:  FHR: 115 bpm, variability: moderate,  accelerations:  Present,  decelerations:  Absent UC:   regular, every 5 minutes SVE:   Dilation: 4 Effacement (%): 80 Station: -3 Exam by:: [email or web address], RN and [name], RN  Labs: Lab Results  Component Value Date   WBC 13.3 (H) 01/11/2021   HGB 11.0 (L) 01/11/2021   HCT 34.7 (L) 01/11/2021   MCV 79.6 (L) 01/11/2021   PLT 447 (H) 01/11/2021    Assessment / Plan: G1P0000 at [redacted]w[redacted]d admitted for PROM (0800) and early labor. At last check having frequent painful contractions that led to cervical change and therefore expectantly managed.   At 4 hour recheck, cervix unchanged and contractions spaced out.  - Start pit 2x2    [redacted]w[redacted]d 01/12/2021, 5:04 AM

## 2021-01-13 ENCOUNTER — Encounter (HOSPITAL_COMMUNITY): Payer: Self-pay | Admitting: Family Medicine

## 2021-01-13 MED ORDER — SIMETHICONE 80 MG PO CHEW: 80.0000 mg | CHEWABLE_TABLET | ORAL | Status: DC | PRN

## 2021-01-13 MED ORDER — TETANUS-DIPHTH-ACELL PERTUSSIS 5-2.5-18.5 LF-MCG/0.5 IM SUSY: 0.5000 mL | PREFILLED_SYRINGE | Freq: Once | INTRAMUSCULAR | Status: DC

## 2021-01-13 MED ORDER — ONDANSETRON HCL 4 MG/2ML IJ SOLN: 4.0000 mg | INTRAMUSCULAR | Status: DC | PRN

## 2021-01-13 MED ORDER — PRENATAL MULTIVITAMIN CH
1.0000 | ORAL_TABLET | Freq: Every day | ORAL | Status: DC
  Administered 2021-01-13 – 2021-01-14 (×2): 1 via ORAL
  Filled 2021-01-13 (×2): qty 1

## 2021-01-13 MED ORDER — WITCH HAZEL-GLYCERIN EX PADS
1.0000 "application " | MEDICATED_PAD | CUTANEOUS | Status: DC | PRN
  Administered 2021-01-13: 1 via TOPICAL

## 2021-01-13 MED ORDER — ONDANSETRON HCL 4 MG PO TABS: 4.0000 mg | ORAL_TABLET | ORAL | Status: DC | PRN

## 2021-01-13 MED ORDER — MEASLES, MUMPS & RUBELLA VAC IJ SOLR: 0.5000 mL | Freq: Once | INTRAMUSCULAR | Status: DC

## 2021-01-13 MED ORDER — SENNOSIDES-DOCUSATE SODIUM 8.6-50 MG PO TABS
2.0000 | ORAL_TABLET | ORAL | Status: DC
  Administered 2021-01-13 – 2021-01-14 (×2): 2 via ORAL
  Filled 2021-01-13 (×2): qty 2

## 2021-01-13 MED ORDER — ACETAMINOPHEN 325 MG PO TABS
650.0000 mg | ORAL_TABLET | ORAL | Status: DC | PRN
  Administered 2021-01-13 – 2021-01-14 (×2): 650 mg via ORAL
  Filled 2021-01-13 (×2): qty 2

## 2021-01-13 MED ORDER — COCONUT OIL OIL: 1.0000 "application " | TOPICAL_OIL | Status: DC | PRN

## 2021-01-13 MED ORDER — IBUPROFEN 600 MG PO TABS
600.0000 mg | ORAL_TABLET | Freq: Four times a day (QID) | ORAL | Status: DC
  Administered 2021-01-13 – 2021-01-14 (×7): 600 mg via ORAL
  Filled 2021-01-13 (×7): qty 1

## 2021-01-13 MED ORDER — DIBUCAINE (PERIANAL) 1 % EX OINT
1.0000 "application " | TOPICAL_OINTMENT | CUTANEOUS | Status: DC | PRN
  Administered 2021-01-14: 1 via RECTAL
  Filled 2021-01-13: qty 28

## 2021-01-13 MED ORDER — BENZOCAINE-MENTHOL 20-0.5 % EX AERO
1.0000 "application " | INHALATION_SPRAY | CUTANEOUS | Status: DC | PRN
  Administered 2021-01-13: 1 via TOPICAL
  Filled 2021-01-13: qty 56

## 2021-01-13 MED ORDER — DIPHENHYDRAMINE HCL 25 MG PO CAPS: 25.0000 mg | ORAL_CAPSULE | Freq: Four times a day (QID) | ORAL | Status: DC | PRN

## 2021-01-13 NOTE — Discharge Instructions (Addendum)
Congrats! You may take tylenol 1000mg  up to 4 times daily in addition to your ibuprofen. Make sure you stay hydrated.   If you have any severe headache or blurred vision not improving with tylenol/ibuprofen, chest pain, shortness of breath--please return to the maternity assessment unit (MAU)

## 2021-01-13 NOTE — Progress Notes (Signed)
POSTPARTUM PROGRESS NOTE  Subjective: Crystal Bentley is a 25 y.o. G1P1001 PPD#1 s/p SVD at [redacted]w[redacted]d.  She reports she doing well. No acute events overnight. She denies any problems with ambulating, voiding or po intake. Denies nausea or vomiting. She has  passed flatus. Pain is well controlled.  Lochia is scant.  Objective: Blood pressure 122/90, pulse 99, temperature 98.1 F (36.7 C), temperature source Oral, resp. rate 18, height 5\' 4"  (1.626 m), weight 60.4 kg, last menstrual period 04/09/2020, SpO2 100 %, unknown if currently breastfeeding.  Physical Exam:  General: alert, cooperative and no distress Chest: no respiratory distress Abdomen: soft, non-tender  Uterine Fundus: firm, appropriately tender Extremities: No calf swelling or tenderness  no edema  Recent Labs    01/11/21 1810  HGB 11.0*  HCT 34.7*    Assessment/Plan: Dominque Dinsmore is a 25 y.o. G1P1001 s/p SVD at 104w2d after SROM and augmentation.  Routine Postpartum Care: Doing well, pain well-controlled.  -- Continue routine care, lactation support  -- Contraception: unsure -- Feeding: breast and bottle  Dispo: Plan for discharge on PPD#2.  [redacted]w[redacted]d, MD, MPH OB Fellow, [facility] for [facility]

## 2021-01-13 NOTE — Anesthesia Postprocedure Evaluation (Signed)
Anesthesia Post Note  Patient: Crystal Bentley  Procedure(s) Performed: AN AD HOC LABOR EPIDURAL     Patient location during evaluation: Mother Baby Anesthesia Type: Epidural Level of consciousness: awake, oriented and awake and alert Pain management: pain level controlled Vital Signs Assessment: post-procedure vital signs reviewed and stable Respiratory status: respiratory function stable, nonlabored ventilation and spontaneous breathing Cardiovascular status: stable Postop Assessment: adequate PO intake, no headache, able to ambulate, patient able to bend at knees, no backache and no apparent nausea or vomiting Anesthetic complications: no   No notable events documented.  Last Vitals:  Vitals:   01/13/21 0930 01/13/21 1350  BP: 123/71 124/89  Pulse: 99 100  Resp: 18 18  Temp: 37.1 C 36.7 C  SpO2:  100%    Last Pain:  Vitals:   01/13/21 1500  TempSrc:   PainSc: 4    Pain Goal: Patients Stated Pain Goal: 3 (01/13/21 1229)                 Alam Guterrez

## 2021-01-13 NOTE — Lactation Note (Signed)
This note was copied from a baby's chart. Lactation Consultation Note  Patient Name: Crystal Bentley [id number] Date: 01/13/2021 Reason for consult: Initial assessment;Mother's request;Primapara;1st time breastfeeding;Term;Breastfeeding assistance;RN request Age:26 hours  LC assisted with latching infant in football as opposed to cradle with more depth on breast and increase in swallows with compression.  Mom electric pump at home.   Mom feeding choice EBF. Mom in communication with [facility] counselor.   Plan 1. To feed based on cues 8-12x 24hr period. Mom to offer breasts and look for signs of milk transfer.  2. I and O sheet reviewed.  All questions answered at the end of the visit.   Maternal Data Has patient been taught Hand Expression?: Yes Does the patient have breastfeeding experience prior to this delivery?: No  Feeding Mother's Current Feeding Choice: Breast Milk  LATCH Score Latch: Repeated attempts needed to sustain latch, nipple held in mouth throughout feeding, stimulation needed to elicit sucking reflex.  Audible Swallowing: Spontaneous and intermittent  Type of Nipple: Everted at rest and after stimulation  Comfort (Breast/Nipple): Soft / non-tender  Hold (Positioning): Assistance needed to correctly position infant at breast and maintain latch.  LATCH Score: 8   Lactation Tools Discussed/Used    Interventions Interventions: Breast feeding basics reviewed;Adjust position;Assisted with latch;Support pillows;Skin to skin;Position options;Education;Breast massage;Expressed milk;Hand express;LC Services brochure  Discharge WIC Program: Yes  Consult Status Consult Status: Follow-up Date: 01/14/21 Follow-up type: In-patient    [name]  [name] 01/13/2021, 4:37 PM

## 2021-01-13 NOTE — Discharge Summary (Addendum)
Postpartum Discharge Summary    Patient Name: Crystal Bentley DOB: [birth year] MRN: [id number]  Date of admission: 01/11/2021 Delivery date:01/12/2021  Delivering provider: Renard Matter  Date of discharge: 01/14/2021  Admitting diagnosis: Indication for care or intervention in labor or delivery [O75.9] Intrauterine pregnancy: [redacted]w[redacted]d    Secondary diagnosis:  Principal Problem:   Indication for care or intervention in labor or delivery Active Problems:   Supervision of other normal pregnancy, antepartum   Alpha thalassemia silent carrier   Palpitations   Vaginal delivery  Additional problems: None    Discharge diagnosis: Term Pregnancy Delivered                                              Post partum procedures: none Augmentation: Pitocin Complications: None  Hospital course: Onset of Labor With Vaginal Delivery      25y.o. yo G1P0000 at 449w3das admitted after SROM and early labor on 01/11/2021. Patient had an uncomplicated labor course as follows:  Membrane Rupture Time/Date: 6:29 PM ,01/11/2021   Delivery Method:Vaginal, Spontaneous  Episiotomy: None  Lacerations:  2nd degree;Perineal  Patient with elevated BP in the postpartum period but did not meet criteria for postpartum HTN (two blood pressure readings  >135 or >85. Upon discussion with L&D team and attending started on lasix 20 mg BID for five days. Will follow up in 1 week for BP check.   She is ambulating, tolerating a regular diet, passing flatus, and urinating well. Patient is discharged home in stable condition on 01/14/21.  Newborn Data: Birth date:01/12/2021  Birth time:9:13 PM  Gender:Female  Living status:Living  Apgars:8 ,9  Weight:7 lb 10.8 oz (3.48 kg)   Magnesium Sulfate received: No BMZ received: No Rhophylac:N/A MMR:N/A T-DaP:Given prenatally Flu: Given prenatally Transfusion:No  Physical exam  Vitals:   01/13/21 1350 01/13/21 2117 01/14/21 0605 01/14/21 0859  BP: 124/89 123/86 124/88 126/90   Pulse: 100 99 90 91  Resp: _0 Temp: 98 F (36.7 C) 98.7 F (37.1 C) 97.9 F (36.6 C)   TempSrc: Oral Oral Oral   SpO2: 100% 99% 100%   Weight:      Height:       General: alert, cooperative, and no distress Lochia: appropriate Uterine Fundus: firm DVT Evaluation: No evidence of DVT seen on physical exam. Labs: Lab Results  Component Value Date   WBC 13.3 (H) 01/11/2021   HGB 11.0 (L) 01/11/2021   HCT 34.7 (L) 01/11/2021   MCV 79.6 (L) 01/11/2021   PLT 447 (H) 01/11/2021   CMP Latest Ref Rng & Units 12/25/2020  Glucose 70 - 99 mg/dL 88  BUN 6 - 20 mg/dL <5(L)  Creatinine 0.44 - 1.00 mg/dL 0.44  Sodium 135 - 145 mmol/L 134(L)  Potassium 3.5 - 5.1 mmol/L 3.8  Chloride 98 - 111 mmol/L 105  CO2 22 - 32 mmol/L 23  Calcium 8.9 - 10.3 mg/dL 9.1  Total Protein 6.5 - 8.1 g/dL 7.0  Total Bilirubin 0.3 - 1.2 mg/dL 0.6  Alkaline Phos 38 - 126 U/L 241(H)  AST 15 - 41 U/L 22  ALT 0 - 44 U/L 15   Edinburgh Score: Edinburgh Postnatal Depression Scale Screening Tool 01/13/2021  I have been able to laugh and see the funny side of things. 3  I have looked forward with enjoyment to things. 0  I have blamed myself unnecessarily when things went wrong. 1  I have been anxious or worried for no good reason. 0  I have felt scared or panicky for no good reason. 0  Things have been getting on top of me. 1  I have been so unhappy that I have had difficulty sleeping. 0  I have felt sad or miserable. 0  I have been so unhappy that I have been crying. 0  The thought of harming myself has occurred to me. 0  Edinburgh Postnatal Depression Scale Total 5    After visit meds:  Allergies as of 01/14/2021   No Known Allergies      Medication List     STOP taking these medications    promethazine 25 MG suppository Commonly known as: PHENERGAN       TAKE these medications    acetaminophen 325 MG tablet Commonly known as: Tylenol Take 2 tablets (650 mg total) by mouth every 4  (four) hours as needed (for pain scale < 4).   furosemide 20 MG tablet Commonly known as: LASIX Take 1 tablet (20 mg total) by mouth 2 (two) times daily.   ibuprofen 600 MG tablet Commonly known as: ADVIL Take 1 tablet (600 mg total) by mouth every 6 (six) hours.   PRENATAL GUMMIES PO Take 1 tablet by mouth daily. Unknown strength        Discharge home in stable condition Infant Feeding: Breast Infant Disposition:home with mother Discharge instruction: per After Visit Summary and Postpartum booklet. Activity: Advance as tolerated. Pelvic rest for 6 weeks.  Diet: routine diet Future Appointments: Future Appointments  Date Time Provider [place]  02/25/2021 10:15 AM Gavin Pound, CNM [facility] None   Follow up Visit: Message sent to [facility] by Dr. Cy Blamer on 12/7  Please schedule this patient for a In person postpartum visit in 4 weeks with the following provider: Any provider. Additional Postpartum F/U: None   Low risk pregnancy complicated by:  None Delivery mode:  Vaginal, Spontaneous  Anticipated Birth Control:  [place] PGY-2  GME ATTESTATION:  I saw and evaluated the patient. I agree with the findings and the plan of care as documented in the resident's note and have made all necessary edits.  Renard Matter, MD, MPH OB Fellow, [place] for [place] 01/14/2021 9:40 AM

## 2021-01-14 LAB — SURGICAL PATHOLOGY

## 2021-01-14 MED ORDER — IBUPROFEN 600 MG PO TABS: 600.0000 mg | ORAL_TABLET | Freq: Four times a day (QID) | ORAL | 0 refills | Status: AC

## 2021-01-14 MED ORDER — BISACODYL 10 MG RE SUPP
10.0000 mg | Freq: Once | RECTAL | Status: AC
  Administered 2021-01-14: 10 mg via RECTAL
  Filled 2021-01-14: qty 1

## 2021-01-14 MED ORDER — POLYETHYLENE GLYCOL 3350 17 G PO PACK
17.0000 g | PACK | Freq: Every day | ORAL | Status: DC | PRN
  Administered 2021-01-14: 17 g via ORAL

## 2021-01-14 MED ORDER — FUROSEMIDE 20 MG PO TABS: 20.0000 mg | ORAL_TABLET | Freq: Two times a day (BID) | ORAL | 0 refills | Status: AC

## 2021-01-14 MED ORDER — ACETAMINOPHEN 325 MG PO TABS
650.0000 mg | ORAL_TABLET | ORAL | 1 refills | Status: AC | PRN
Start: 2021-01-14 — End: ?

## 2021-01-14 MED ORDER — FUROSEMIDE 20 MG PO TABS
20.0000 mg | ORAL_TABLET | Freq: Once | ORAL | Status: AC
  Administered 2021-01-14: 20 mg via ORAL
  Filled 2021-01-14: qty 1

## 2021-01-14 NOTE — Lactation Note (Signed)
This note was copied from a baby's chart. Lactation Consultation Note  Patient Name: Crystal Bentley Date: 01/14/2021 Reason for consult: Follow-up assessment Age:27 hours  P1, Mother states at times it is difficult to latch baby. Noted on oral assessment that infant has short lingual frenulum.  Suggest discussing with Ped MD. Pecola Leisure latched easily but needed help flanging lips. Mother feels both pulling and pinching. Provided manual pump and reviewed use. Reviewed engorgement care and monitoring voids/stools.   Maternal Data Has patient been taught Hand Expression?: Yes Does the patient have breastfeeding experience prior to this delivery?: No  Feeding Mother's Current Feeding Choice: Breast Milk  LATCH Score Latch: Grasps breast easily, tongue down, lips flanged, rhythmical sucking.  Audible Swallowing: A few with stimulation  Type of Nipple: Everted at rest and after stimulation  Comfort (Breast/Nipple): Filling, red/small blisters or bruises, mild/mod discomfort  Hold (Positioning): Assistance needed to correctly position infant at breast and maintain latch.  LATCH Score: 7   Lactation Tools Discussed/Used    Interventions Interventions: Breast feeding basics reviewed;Assisted with latch;Skin to skin;Hand express;Education;Hand pump  Discharge Discharge Education: Engorgement and breast care;Warning signs for feeding baby Pump: Manual  Consult Status Consult Status: Complete Date: 01/14/21    Dahlia Byes [facility] 01/14/2021, 10:00 AM

## 2021-01-15 ENCOUNTER — Encounter: Payer: Self-pay | Admitting: Family Medicine

## 2021-01-17 ENCOUNTER — Inpatient Hospital Stay (HOSPITAL_COMMUNITY)
Admission: AD | Admit: 2021-01-17 | Payer: BC Managed Care – PPO | Source: Home / Self Care | Admitting: Obstetrics and Gynecology

## 2021-01-17 ENCOUNTER — Inpatient Hospital Stay (HOSPITAL_COMMUNITY): Payer: BC Managed Care – PPO

## 2021-01-21 ENCOUNTER — Other Ambulatory Visit: Payer: Self-pay

## 2021-01-21 ENCOUNTER — Ambulatory Visit: Payer: BC Managed Care – PPO | Admitting: *Deleted

## 2021-01-21 DIAGNOSIS — Z0130 Encounter for examination of blood pressure without abnormal findings: Secondary | ICD-10-CM

## 2021-01-21 NOTE — Progress Notes (Signed)
Subjective:  Crystal Bentley is a 25 y.o. female here for BP check.   Hypertension ROS: taking medications as instructed, no medication side effects noted, no TIA's, no chest pain on exertion, no dyspnea on exertion, and no swelling of ankles.    Objective:  BP 132/88    Pulse 82    Wt 122 lb (55.3 kg)    LMP 04/09/2020    BMI 20.94 kg/m   Appearance alert, well appearing, and in no distress. General exam BP noted to be well controlled today in office.    Assessment:   Blood Pressure stable.   Plan:  Current treatment plan is effective, no change in therapy. Pt advised to keep upcoming PP appt.

## 2021-01-21 NOTE — Progress Notes (Signed)
Patient was assessed and managed by nursing staff during this encounter. I have reviewed the chart and agree with the documentation and plan. I have also made any necessary editorial changes.  Catalina Antigua, MD 01/21/2021 11:51 AM

## 2021-01-26 ENCOUNTER — Telehealth (HOSPITAL_COMMUNITY): Payer: Self-pay | Admitting: *Deleted

## 2021-01-26 NOTE — Telephone Encounter (Signed)
Attempted hospital discharge follow-up call. Left message for patient to return RN call. Deforest Hoyles, RN, 01/26/21, [phone]

## 2021-01-28 ENCOUNTER — Telehealth: Payer: Self-pay | Admitting: *Deleted

## 2021-01-28 ENCOUNTER — Encounter: Payer: Self-pay | Admitting: Family Medicine

## 2021-01-28 NOTE — Telephone Encounter (Signed)
Pt called to office stating she is constipated and Miralax isn't working. Pt would like to know if she may use Enema that she has at home.  Advised she may use enema if she feels it's needed.  Advised to continue Miralax and drink plenty of fluids.

## 2021-02-25 ENCOUNTER — Other Ambulatory Visit: Payer: Self-pay

## 2021-02-25 ENCOUNTER — Ambulatory Visit (INDEPENDENT_AMBULATORY_CARE_PROVIDER_SITE_OTHER): Payer: BC Managed Care – PPO

## 2021-02-25 DIAGNOSIS — Z30014 Encounter for initial prescription of intrauterine contraceptive device: Secondary | ICD-10-CM

## 2021-02-25 DIAGNOSIS — Z01812 Encounter for preprocedural laboratory examination: Secondary | ICD-10-CM | POA: Diagnosis not present

## 2021-02-25 DIAGNOSIS — M545 Low back pain, unspecified: Secondary | ICD-10-CM | POA: Diagnosis not present

## 2021-02-25 LAB — POCT URINE PREGNANCY: Preg Test, Ur: NEGATIVE

## 2021-02-25 MED ORDER — CYCLOBENZAPRINE HCL 10 MG PO TABS: 10.0000 mg | ORAL_TABLET | Freq: Three times a day (TID) | ORAL | 0 refills | Status: AC | PRN

## 2021-02-25 MED ORDER — LEVONORGESTREL 20.1 MCG/DAY IU IUD
1.0000 | INTRAUTERINE_SYSTEM | Freq: Once | INTRAUTERINE | Status: AC
  Administered 2021-02-25: 1 via INTRAUTERINE

## 2021-02-25 NOTE — Progress Notes (Signed)
Crystal Bentley Visit Note  Crystal Bentley is a 26 y.o. G68P1001 female who presents for a postpartum visit. She is 6 weeks postpartum following a normal spontaneous vaginal delivery.  I have fully reviewed the prenatal and intrapartum course. The delivery was at 40.3 gestational weeks.  Anesthesia: epidural. Postpartum course has been unremarkable. Baby is doing well. Baby is feeding by breast. Bleeding no bleeding. Bowel function is normal. Bladder function is normal. Patient is not sexually active. Contraception method is IUD. Postpartum depression screening: negative.  She reports she receives support, with infant care, from her husband, but her works during the day.  She states she is unsure of how much sleep she gets, but does not feel it is 4 hours.  She also states she would like an additional 6 weeks off of work, but does not qualify for [employer].    [place] - 02/25/21 1023       Pregnancy Intention Screening   Does the patient want to become pregnant in the next year? No    Does the patient's partner want to become pregnant in the next year? No    Would the patient like to discuss contraceptive options today? No      Contraception Wrap Up   Current Method IUD or IUS    End Method IUD or IUS            The pregnancy intention screening data noted above was reviewed. Potential methods of contraception were discussed. The patient elected to proceed with IUD or IUS.   Edinburgh Postnatal Depression Scale - 02/25/21 1022       Edinburgh Postnatal Depression Scale:  In the Past 7 Days   I have been able to laugh and see the funny side of things. 0    I have looked forward with enjoyment to things. 0    I have blamed myself unnecessarily when things went wrong. 0    I have been anxious or worried for no good reason. 0    I have felt scared or panicky for no good reason. 0    Things have been getting on top of me. 0    I have been so unhappy that I have had difficulty sleeping.  0    I have felt sad or miserable. 0    I have been so unhappy that I have been crying. 0    The thought of harming myself has occurred to me. 0    Edinburgh Postnatal Depression Scale Total 0             Health Maintenance Due  Topic Date Due   COVID-19 Vaccine (1) Never done   HPV VACCINES (1 - 2-dose series) Never done    The following portions of the patient's history were reviewed and updated as appropriate: allergies, current medications, past family history, past medical history, past social history, past surgical history, and problem list.  Review of Systems Pertinent items are noted in HPI.  Objective:  BP 118/78    Pulse 74    Wt 118 lb (53.5 kg)    LMP 04/09/2020    Breastfeeding Yes    BMI 20.25 kg/m    General:  alert, cooperative, and no distress   Breasts:  normal  Lungs: clear to auscultation bilaterally  Heart:  regular rate and rhythm  Abdomen: soft, non-tender; bowel sounds normal; no masses,  no organomegaly   Wound N/A  GU exam:  normal  Assessment:   6 week postpartum exam.  Normal [employer] Breastfeeding IUD Desired Back Pain Plan:   -Okay to return to work and other activities as tolerated/desired. -Informed that she has no medical indication for an additional 6 weeks off and provider is willing to write her out, but it would indicate that it is per patient request. -Patient further informed that her job may not hold her position under said condition and she needs to confirm with them.  However, provider happy to write letter requesting additional time.  -Reviewed c/o back pain and encouraged to try tylenol and/or heating pad. -Will also send script for flexeril for usage. -Instructed to contact office for PCP or PT referral if symptoms persist despite interventions. -Informed that back pain, acute, is not indication for additional time off work. -IUD inserted. See procedure note.  Essential components of care per ACOG  recommendations:  1.  Mood and well being: Patient with negative depression screening today. Reviewed local resources for support.  - Patient tobacco use? No.   - hx of drug use? No.    2. Infant care and feeding:  -Patient currently breastmilk feeding? Yes. Reviewed importance of draining breast regularly to support lactation.  -Social determinants of health (SDOH) reviewed in EPIC. No concerns  3. Sexuality, contraception and birth spacing - Patient does not want a pregnancy in the next year.  Desired family size is unsure of children in the future.  - Reviewed forms of contraception in tiered fashion. Patient desired IUD today.   - Discussed birth spacing of 18 months  4. Sleep and fatigue -Encouraged family/partner/community support of 4 hrs of uninterrupted sleep to help with mood and fatigue  5. Physical Recovery  - Discussed patients delivery and complications. She describes her labor as good. - Patient had a Vaginal, no problems at delivery. Patient had a 2nd degree laceration. Perineal healing reviewed. Patient expressed understanding - Patient has urinary incontinence? No. - Patient is safe to resume physical and sexual activity  6.  Health Maintenance - HM due items addressed Yes - Last pap smear  Diagnosis  Date Value Ref Range Status  02/27/2020   Final   - Negative for intraepithelial lesion or malignancy (NILM)   Pap smear not done at today's visit.  -Breast Cancer screening indicated? No.   7. Chronic Disease/Pregnancy Condition follow up: None  - PCP follow up  Maryann Conners MSN, [place] Provider, Center for [facility]

## 2021-02-25 NOTE — Progress Notes (Signed)
Patient 6 weeks PP, wants IUD for [facility].  UPT today is NEGATIVE

## 2021-02-25 NOTE — Progress Notes (Signed)
Administrations This Visit     levonorgestrel (LILETTA) 20.1 MCG/DAY IUD 1 each     Admin Date 02/25/2021 Action Given Dose 1 each Route Intrauterine Administered By Maretta Bees, RMA

## 2021-02-25 NOTE — Progress Notes (Signed)
° ° °  GYNECOLOGY OFFICE PROCEDURE NOTE  Crystal Bentley is a 26 y.o. G1P1001 here for [place] IUD insertion. No GYN concerns.  Last pap smear was on Jan 2022 and was normal.  IUD Insertion Procedure Note IUD: Liletta  Exp: 04/2023  Lot: [id number]  Patient identified, informed consent performed, consent signed.   Discussed risks of irregular bleeding, cramping, infection, malpositioning or misplacement of the IUD outside the uterus which may require further procedure such as laparoscopy. Time out was performed.  Urine pregnancy test negative.  Bimanual exam performed and uterus of normal size, non-tender, and anteverted position. Speculum placed in the vagina and cervix visualized.  Cervix and vaginal walls cleaned x 3 with betadine solution. Anterior aspect grasped with a single tooth tenaculum.  Uterus sounded to 8 cm.  Liletta IUD placed per manufacturer's recommendations.  Strings trimmed to ~3 cm. Tenaculum was removed, good hemostasis noted with pressure.  Patient tolerated procedure well.   Patient was given post-procedure instructions.  She was advised to have backup contraception for one week.  Patient was instructed to check IUD strings after menses or every 2 months in the absence of menses. Patient also instructed to follow up in 4 weeks for IUD check and call and/or report any issues prior to next visit.  Maryann Conners, CNM 02/25/2021

## 2021-03-04 ENCOUNTER — Encounter: Payer: Self-pay | Admitting: *Deleted

## 2021-03-04 ENCOUNTER — Telehealth: Payer: Self-pay | Admitting: *Deleted

## 2021-03-04 NOTE — Telephone Encounter (Signed)
Returned TC to patient regarding vaginal bleeding 7+ wks postpartum. No answer. Left HIPPA compliant VM with call back number and indicated that a MyChart message would be sent. Patient likely experiencing first menses since delivery.

## 2021-03-17 ENCOUNTER — Encounter: Payer: Self-pay | Admitting: *Deleted

## 2021-03-25 ENCOUNTER — Other Ambulatory Visit: Payer: Self-pay

## 2021-03-25 ENCOUNTER — Encounter: Payer: Self-pay | Admitting: Nurse Practitioner

## 2021-03-25 ENCOUNTER — Ambulatory Visit (INDEPENDENT_AMBULATORY_CARE_PROVIDER_SITE_OTHER): Payer: BC Managed Care – PPO | Admitting: Nurse Practitioner

## 2021-03-25 VITALS — BP 128/82 | HR 81 | Ht 64.0 in | Wt 118.0 lb

## 2021-03-25 DIAGNOSIS — Z30431 Encounter for routine checking of intrauterine contraceptive device: Secondary | ICD-10-CM

## 2021-03-25 DIAGNOSIS — N939 Abnormal uterine and vaginal bleeding, unspecified: Secondary | ICD-10-CM

## 2021-03-25 MED ORDER — NORETHINDRONE 0.35 MG PO TABS: 1.0000 | ORAL_TABLET | Freq: Every day | ORAL | 1 refills | Status: AC

## 2021-03-25 NOTE — Progress Notes (Signed)
° ° °  GYNECOLOGY OFFICE ENCOUNTER NOTE  History:  26 y.o. G1P1001 here today for today for IUD string check; Liletta  IUD was placed  02-25-21. Is not having cramping but is having bleeding that she feels is excessive.  Her bleeding had stopped after the baby and then when the IUD was inserted, she started having bleeding.  Also had teeth extracted yesterday and is quite concerned about the loss of blood vaginally and with her dental work. Reassured patient and will draw blood today as she is very worried about being anemic.  The following portions of the patient's history were reviewed and updated as appropriate: allergies, current medications, past family history, past medical history, past social history, past surgical history and problem list. Last pap smear on 02-27-20 was normal.  Review of Systems:  Pertinent items are noted in HPI.   Objective:  Physical Exam Blood pressure 128/82, pulse 81, height 5\' 4"  (1.626 m), weight 118 lb (53.5 kg), currently breastfeeding. CONSTITUTIONAL: Well-developed, well-nourished female in no acute distress.  HENT:  Normocephalic, atraumatic. External right and left ear normal. Oropharynx is clear and moist EYES: Conjunctivae and EOM are normal. Pupils are equal, round, and reactive to light. No scleral icterus.  NECK: Normal range of motion, supple, no masses CARDIOVASCULAR: Normal heart rate noted RESPIRATORY: Effort and breath sounds normal, no problems with respiration noted ABDOMEN: Soft, no distention noted.   PELVIC: Normal appearing external genitalia; normal appearing vaginal mucosa and cervix.  IUD strings visualized, curling around cervix.   Assessment & Plan:  CBC done today to evaluate for anemia. Patient unable to talk due to dental work.  Typing conversation on her phone. Will give Micronor for a couple of months.  If continues to bleed,, will need STD testing also. Patient to keep IUD in place for up to seven years; can come in for  removal if she desires pregnancy earlier or for or concerning side effects.  , RN, MSN, NP-BC Nurse Practitioner, [facility] for [facility] 03/25/2021 8:24 PM

## 2021-03-25 NOTE — Progress Notes (Signed)
GYN presents for String Check.  C/o bleeding with the IUD.

## 2021-03-26 LAB — CBC
Hematocrit: 38 % (ref 34.0–46.6)
Hemoglobin: 11.8 g/dL (ref 11.1–15.9)
MCH: 25.7 pg — ABNORMAL LOW (ref 26.6–33.0)
MCHC: 31.1 g/dL — ABNORMAL LOW (ref 31.5–35.7)
MCV: 83 fL (ref 79–97)
Platelets: 360 10*3/uL (ref 150–450)
RBC: 4.6 x10E6/uL (ref 3.77–5.28)
RDW: 14.9 % (ref 11.7–15.4)
WBC: 9.1 10*3/uL (ref 3.4–10.8)

## 2021-03-27 ENCOUNTER — Other Ambulatory Visit: Payer: Self-pay

## 2021-04-22 ENCOUNTER — Encounter: Payer: Self-pay | Admitting: Family Medicine

## 2021-04-22 ENCOUNTER — Other Ambulatory Visit: Payer: Self-pay

## 2021-04-22 ENCOUNTER — Ambulatory Visit (INDEPENDENT_AMBULATORY_CARE_PROVIDER_SITE_OTHER): Payer: BC Managed Care – PPO | Admitting: Family Medicine

## 2021-04-22 ENCOUNTER — Ambulatory Visit: Payer: BC Managed Care – PPO | Admitting: Family Medicine

## 2021-04-22 VITALS — BP 116/77 | HR 82 | Ht 64.0 in | Wt 121.0 lb

## 2021-04-22 DIAGNOSIS — N939 Abnormal uterine and vaginal bleeding, unspecified: Secondary | ICD-10-CM | POA: Diagnosis not present

## 2021-04-22 DIAGNOSIS — Z30431 Encounter for routine checking of intrauterine contraceptive device: Secondary | ICD-10-CM

## 2021-04-22 NOTE — Progress Notes (Signed)
? ?GYNECOLOGY OFFICE VISIT NOTE ? ?History:  ? Crystal Bentley is a 26 y.o. G1P1001 here today for IUD check and bleeding concern. ? ?IUD placed 02/25/21. No complications with placement. Placed at her postpartum visit. ?Reports she has been bleeding since then which has been bothersome to her. ?Reports she is bleeding every day.  ?Denies any pelvic pain or discomfort.  ?Reports she was seen about a month ago and they prescribed pills (Micronor) to her to help with bleeding but they did not do anything.  ?She states that they prefer non-hormonal options and proceeded with Paragard IUD insertion for this reason. ?Hemoglobin was checked last visit and normal. ?Reports using 2 pads per day, describes bleeding as a normal period for her. Not clots and not severe. ?She is wondering what else she can do for the bleeding and what else needs to be done to investigate why this is happening every day. ?Husband accompanies her today.  ?No other concerns.  ?  ?Past Medical History:  ?Diagnosis Date  ? Medical history non-contributory   ? ? ?Past Surgical History:  ?Procedure Laterality Date  ? INGUINAL HERNIA REPAIR    ? 5 yrs   ? OVARIAN CYST REMOVAL    ? 7 yrs   ? ?The following portions of the patient's history were reviewed and updated as appropriate: allergies, current medications, past family history, past medical history, past social history, past surgical history and problem list.  ? ?Review of Systems:  ?Pertinent items noted in HPI and remainder of comprehensive ROS otherwise negative. ? ?Physical Exam:  ?BP 116/77   Pulse 82   Ht 5\' 4"  (1.626 m)   Wt 121 lb (54.9 kg)   Breastfeeding Yes   BMI 20.77 kg/m?  ? ?CONSTITUTIONAL: Well-developed, well-nourished female in no acute distress.  ?CARDIOVASCULAR: Normal heart rate noted. ?RESPIRATORY: Normal work of breathing on room air.  ?PELVIC: Normal appearing external genitalia; normal urethral meatus; normal appearing vaginal mucosa and cervix.  IUD strings (blue  in color) visualized exiting cervical os, approximately 3 cm.  Small amount of dark blood in vaginal vault.  Performed in the presence of a chaperone. ? ?Assessment and Plan:  ? ?1. IUD check up ?Normal exam with IUD strings visible exiting cervical os appropriately. Patient continued to insist that she had Paragard placed, however, strings noted to be blue on exam, which is not typical for Paragard device. Procedure note reviewed, confirmed that indeed a Liletta IUD was actually placed. Disclosed information with patient and her husband, who were understandably disappointed and frustrated. Discussed options including removal of current Liletta IUD and switching to alternative method versus treating current symptoms with course of Provera and obtaining an ultrasound to assess proper placement and investigate any other possible structural cause of her bleeding. Also reassured patient that irregular bleeding can be expected with many forms of birth control in the first 3-6 months. After discussion with her husband, they would like to think about their options and consider other methods before deciding how to proceed. Provided as a resource to review contraceptive methods. Will follow up in 10-14 days per patient preference with plan to decided on which method she would like to proceed with and next steps. All questions and concerns addressed.  ? ?2. Abnormal uterine bleeding ?Stable. Occurring every day since placement but light in nature without pain. Hemoglobin normal at last visit. Offered course of Provera and ultrasound to further investigate internal placement of IUD and any other possible source  of her bleeding. Patient would like to hold off for now and consider other contraceptive options before deciding how to proceed as discussed above. Will reassess at next visit.  ? ?Return for follow up visit in 10-14 days to discuss birth control options.   ? ?Evalina Field, MD ?OB Fellow, Faculty  Practice ?[place] ?04/23/2021 7:14 AM ? ?

## 2021-04-22 NOTE — Progress Notes (Deleted)
Pt presents for IUD check. ?Liletta placed 02/25/21 ?

## 2021-04-22 NOTE — Progress Notes (Signed)
26 y.o GYN presents for IUD check, c/o bleeding. ?

## 2021-04-23 ENCOUNTER — Encounter: Payer: Self-pay | Admitting: Family Medicine

## 2021-05-06 ENCOUNTER — Ambulatory Visit: Payer: BC Managed Care – PPO | Admitting: Obstetrics

## 2021-06-11 ENCOUNTER — Encounter: Payer: Self-pay | Admitting: Obstetrics

## 2021-06-11 ENCOUNTER — Other Ambulatory Visit (HOSPITAL_COMMUNITY)
Admission: RE | Admit: 2021-06-11 | Discharge: 2021-06-11 | Disposition: A | Discharge status: Home / Self Care | Payer: BC Managed Care – PPO | Source: Ambulatory Visit | Attending: Obstetrics | Admitting: Obstetrics

## 2021-06-11 ENCOUNTER — Ambulatory Visit (INDEPENDENT_AMBULATORY_CARE_PROVIDER_SITE_OTHER): Payer: BC Managed Care – PPO | Admitting: Obstetrics

## 2021-06-11 VITALS — BP 118/81 | HR 79 | Ht 64.0 in | Wt 123.8 lb

## 2021-06-11 DIAGNOSIS — Z01419 Encounter for gynecological examination (general) (routine) without abnormal findings: Secondary | ICD-10-CM | POA: Insufficient documentation

## 2021-06-11 DIAGNOSIS — Z30431 Encounter for routine checking of intrauterine contraceptive device: Secondary | ICD-10-CM

## 2021-06-11 NOTE — Progress Notes (Signed)
No questions or concerns at this time 

## 2021-06-11 NOTE — Progress Notes (Signed)
? ?Subjective: ? ? ?  ?  ? Crystal Bentley is a 26 y.o. female here for a routine exam.  Current complaints: None.   ? ?Personal health questionnaire:  ?Is patient Ashkenazi Jewish, have a family history of breast and/or ovarian cancer: no ?Is there a family history of uterine cancer diagnosed at age < 6, gastrointestinal cancer, urinary tract cancer, family member who is a Field seismologist syndrome-associated carrier: no ?Is the patient overweight and hypertensive, family history of diabetes, personal history of gestational diabetes, preeclampsia or PCOS: no ?Is patient over 59, have PCOS,  family history of premature CHD under age 51, diabetes, smoke, have hypertension or peripheral artery disease:  no ?At any time, has a partner hit, kicked or otherwise hurt or frightened you?: no ?Over the past 2 weeks, have you felt down, depressed or hopeless?: no ?Over the past 2 weeks, have you felt little interest or pleasure in doing things?:no ? ? ?Gynecologic History ?No LMP recorded. (Menstrual status: IUD). ?Contraception: IUD ?Last Pap: 02-27-2020. Results were: normal ?Last mammogram: n/a. Results were: n/a ? ?Obstetric History ?OB History  ?Gravida Para Term Preterm AB Living  ?1 1 1  0 0 1  ?SAB IAB Ectopic Multiple Live Births  ?0 0 0 0 1  ?  ?# Outcome Date GA Lbr Len/2nd Weight Sex Delivery Anes PTL Lv  ?1 Term 01/12/21 [redacted]w[redacted]d 43:10 / 06:03 7 lb 10.8 oz (3.48 kg) M Vag-Spont EPI  LIV  ? ? ?Past Medical History:  ?Diagnosis Date  ? Medical history non-contributory   ?  ?Past Surgical History:  ?Procedure Laterality Date  ? INGUINAL HERNIA REPAIR    ? 5 yrs   ? OVARIAN CYST REMOVAL    ? 7 yrs   ?  ? ?Current Outpatient Medications:  ?  acetaminophen (TYLENOL) 325 MG tablet, Take 2 tablets (650 mg total) by mouth every 4 (four) hours as needed (for pain scale < 4)., Disp: 60 tablet, Rfl: 1 ?  cyclobenzaprine (FLEXERIL) 10 MG tablet, Take 1 tablet (10 mg total) by mouth every 8 (eight) hours as needed for muscle spasms.  (Patient not taking: Reported on 03/25/2021), Disp: 30 tablet, Rfl: 0 ?  furosemide (LASIX) 20 MG tablet, Take 1 tablet (20 mg total) by mouth 2 (two) times daily. (Patient not taking: Reported on 03/25/2021), Disp: 10 tablet, Rfl: 0 ?  ibuprofen (ADVIL) 600 MG tablet, Take 1 tablet (600 mg total) by mouth every 6 (six) hours. (Patient not taking: Reported on 06/11/2021), Disp: 30 tablet, Rfl: 0 ?  norethindrone (MICRONOR) 0.35 MG tablet, Take 1 tablet (0.35 mg total) by mouth daily. (Patient not taking: Reported on 06/11/2021), Disp: 28 tablet, Rfl: 1 ?  Prenatal MV & Min w/FA-DHA (PRENATAL GUMMIES PO), Take 1 tablet by mouth daily. Unknown strength (Patient not taking: Reported on 01/21/2021), Disp: , Rfl:  ?No Known Allergies  ?Social History  ? ?Tobacco Use  ? Smoking status: Never  ? Smokeless tobacco: Never  ?Substance Use Topics  ? Alcohol use: Never  ?  ?Family History  ?Problem Relation Age of Onset  ? Healthy Mother   ? Stroke Father   ?  ? ? ?Review of Systems ? ?Constitutional: negative for fatigue and weight loss ?Respiratory: negative for cough and wheezing ?Cardiovascular: negative for chest pain, fatigue and palpitations ?Gastrointestinal: negative for abdominal pain and change in bowel habits ?Musculoskeletal:negative for myalgias ?Neurological: negative for gait problems and tremors ?Behavioral/Psych: negative for abusive relationship, depression ?Endocrine: negative for temperature intolerance    ?  Genitourinary:negative for abnormal menstrual periods, genital lesions, hot flashes, sexual problems and vaginal discharge ?Integument/breast: negative for breast lump, breast tenderness, nipple discharge and skin lesion(s) ? ?  ?Objective:  ? ?    ?BP 118/81   Pulse 79   Ht 5\' 4"  (1.626 m)   Wt 123 lb 12.8 oz (56.2 kg)   Breastfeeding Yes   BMI 21.25 kg/m?  ?General:   Alert and no distress  ?Skin:   no rash or abnormalities  ?Lungs:   clear to auscultation bilaterally  ?Heart:   regular rate and rhythm,  S1, S2 normal, no murmur, click, rub or gallop  ?Breasts:   normal without suspicious masses, skin or nipple changes or axillary nodes  ?Abdomen:  normal findings: no organomegaly, soft, non-tender and no hernia  ?Pelvis:  External genitalia: normal general appearance ?Urinary system: urethral meatus normal and bladder without fullness, nontender ?Vaginal: normal without tenderness, induration or masses ?Cervix: normal appearance.  IUD string visible ?Adnexa: normal bimanual exam ?Uterus: anteverted and non-tender, normal size  ? ?Lab Review ?Urine pregnancy test ?Labs reviewed yes ?Radiologic studies reviewed no ? ?I have spent a total of 20 minutes of face-to-face time, excluding clinical staff time, reviewing notes and preparing to see patient, ordering tests and/or medications, and counseling the patient.  ? ?Assessment:  ? ? 1. Encounter for routine gynecological examination with Papanicolaou smear of cervix ?Rx: ?- Cytology - PAP ? ?2. IUD check up ?- doing well  ?  ?Plan:  ? ? Education reviewed: calcium supplements, depression evaluation, low fat, low cholesterol diet, safe sex/STD prevention, self breast exams, and weight bearing exercise. ?Contraception: IUD. ?Follow up in: 1 year.  ? ? ? Shelly Bombard, MD ?06/11/2021 10:15 AM  ?

## 2021-06-14 LAB — CYTOLOGY - PAP
Chlamydia: NEGATIVE
Comment: NEGATIVE
Comment: NORMAL
Diagnosis: NEGATIVE
Neisseria Gonorrhea: NEGATIVE

## 2021-09-25 ENCOUNTER — Other Ambulatory Visit: Payer: Self-pay

## 2021-09-25 ENCOUNTER — Ambulatory Visit
Admission: EM | Admit: 2021-09-25 | Discharge: 2021-09-25 | Disposition: A | Discharge status: Home / Self Care | Payer: BC Managed Care – PPO | Attending: Physician Assistant | Admitting: Physician Assistant

## 2021-09-25 ENCOUNTER — Encounter: Payer: Self-pay | Admitting: Emergency Medicine

## 2021-09-25 DIAGNOSIS — Z20822 Contact with and (suspected) exposure to covid-19: Secondary | ICD-10-CM | POA: Diagnosis present

## 2021-09-25 DIAGNOSIS — J069 Acute upper respiratory infection, unspecified: Secondary | ICD-10-CM | POA: Diagnosis present

## 2021-09-25 NOTE — ED Provider Notes (Signed)
[name]    CSN: [id number] Arrival date & time: 09/25/21  1525      History   Chief Complaint Chief Complaint  Patient presents with   Fatigue   Cough    HPI Crystal Bentley is a 26 y.o. female.   Pt complains of cough and fatigue that started about 5 days ago.  She reports contact with someone who has COVID.  She has taken otc cough medicines and Tylenol with some relief.  She reports two episodes of vomiting.  Denies nausea, diarrhea, abdominal pain, shortness of breath, wheezing, fever.     Past Medical History:  Diagnosis Date   Medical history non-contributory     Patient Active Problem List   Diagnosis Date Noted   Anemia 12/24/2020   Palpitations 10/31/2020   Back pain in pregnancy 10/28/2020   No blood products 09/30/2020   Sickle cell trait (HCC) 07/29/2020   Alpha thalassemia silent carrier 07/29/2020    Past Surgical History:  Procedure Laterality Date   INGUINAL HERNIA REPAIR     5 yrs    OVARIAN CYST REMOVAL     6 yrs     OB History     Gravida  1   Para  1   Term  1   Preterm  0   AB  0   Living  1      SAB  0   IAB  0   Ectopic  0   Multiple  0   Live Births  1            Home Medications    Prior to Admission medications   Medication Sig Start Date End Date Taking? Authorizing Provider  acetaminophen (TYLENOL) 325 MG tablet Take 2 tablets (650 mg total) by mouth every 4 (four) hours as needed (for pain scale < 4). 01/14/21  Yes Trula Slade, MD  Prenatal MV & Min w/FA-DHA (PRENATAL GUMMIES PO) Take 1 tablet by mouth daily. Unknown strength   Yes [provider]  cyclobenzaprine (FLEXERIL) 10 MG tablet Take 1 tablet (10 mg total) by mouth every 8 (eight) hours as needed for muscle spasms. Patient not taking: Reported on 03/25/2021 02/25/21   Gerrit Heck, CNM  furosemide (LASIX) 20 MG tablet Take 1 tablet (20 mg total) by mouth 2 (two) times daily. Patient not taking: Reported on  03/25/2021 01/14/21   Trula Slade, MD  ibuprofen (ADVIL) 600 MG tablet Take 1 tablet (600 mg total) by mouth every 6 (six) hours. Patient not taking: Reported on 06/11/2021 01/14/21   Trula Slade, MD  norethindrone (MICRONOR) 0.35 MG tablet Take 1 tablet (0.35 mg total) by mouth daily. Patient not taking: Reported on 06/11/2021 03/25/21   Currie Paris, NP    Family History Family History  Problem Relation Age of Onset   Healthy Mother    Stroke Father     Social History Social History   Tobacco Use   Smoking status: Never   Smokeless tobacco: Never  Vaping Use   Vaping Use: Never used  Substance Use Topics   Alcohol use: Never   Drug use: Never     Allergies   Patient has no known allergies.   Review of Systems Review of Systems  Constitutional:  Positive for fatigue. Negative for chills and fever.  HENT:  Negative for ear pain and sore throat.   Eyes:  Negative for pain and visual disturbance.  Respiratory:  Positive for cough.  Negative for shortness of breath.   Cardiovascular:  Negative for chest pain and palpitations.  Gastrointestinal:  Positive for vomiting. Negative for abdominal pain.  Genitourinary:  Negative for dysuria and hematuria.  Musculoskeletal:  Negative for arthralgias and back pain.  Skin:  Negative for color change and rash.  Neurological:  Negative for seizures and syncope.  All other systems reviewed and are negative.    Physical Exam Triage Vital Signs ED Triage Vitals  Enc Vitals Group     BP 09/25/21 1534 127/81     Pulse Rate 09/25/21 1534 81     Resp 09/25/21 1534 14     Temp 09/25/21 1534 97.9 F (36.6 C)     Temp Source 09/25/21 1534 Oral     SpO2 09/25/21 1534 97 %     Weight --      Height --      Head Circumference --      Peak Flow --      Pain Score 09/25/21 1538 0     Pain Loc --      Pain Edu? --      Excl. in GC? --    No data found.  Updated Vital Signs BP 127/81 (BP Location: Right Arm)   Pulse  81   Temp 97.9 F (36.6 C) (Oral)   Resp 14   LMP  (LMP Unknown)   SpO2 97%   Breastfeeding Yes   Visual Acuity Right Eye Distance:   Left Eye Distance:   Bilateral Distance:    Right Eye Near:   Left Eye Near:    Bilateral Near:     Physical Exam Vitals and nursing note reviewed.  Constitutional:      General: She is not in acute distress.    Appearance: She is well-developed.  HENT:     Head: Normocephalic and atraumatic.  Eyes:     Conjunctiva/sclera: Conjunctivae normal.  Cardiovascular:     Rate and Rhythm: Normal rate and regular rhythm.     Heart sounds: No murmur heard. Pulmonary:     Effort: Pulmonary effort is normal. No respiratory distress.     Breath sounds: Normal breath sounds.  Abdominal:     Palpations: Abdomen is soft.     Tenderness: There is no abdominal tenderness.  Musculoskeletal:        General: No swelling.     Cervical back: Neck supple.  Skin:    General: Skin is warm and dry.     Capillary Refill: Capillary refill takes less than 2 seconds.  Neurological:     Mental Status: She is alert.  Psychiatric:        Mood and Affect: Mood normal.      UC Treatments / Results  Labs (all labs ordered are listed, but only abnormal results are displayed) Labs Reviewed  SARS CORONAVIRUS 2 BY RT PCR    EKG   Radiology No results found.  Procedures Procedures (including critical care time)  Medications Ordered in UC Medications - No data to display  Initial Impression / Assessment and Plan / UC Course  I have reviewed the triage vital signs and the nursing notes.  Pertinent labs & imaging results that were available during my care of the patient were reviewed by me and considered in my medical decision making (see chart for details).     Viral URI, COVID test pending.  Will call with results.  Supportive care discussed. Pt overall well appearing, no acute distress, lungs  clear, vitals wnl.  Stable for discharge.  Final Clinical  Impressions(s) / UC Diagnoses   Final diagnoses:  Acute upper respiratory infection     Discharge Instructions      COVID test pending, will call with results Recommend cough syrup like Robitussin Can take Flonase and Mucinex Can take Tylenol as needed for fever and body aches.    ED Prescriptions   None    PDMP not reviewed this encounter.   Ward, Tylene Fantasia, PA-C 09/25/21 1609

## 2021-09-25 NOTE — ED Triage Notes (Signed)
Patient c/o fatigue and nonproductive cough x 5 days.    Patient denies diarrhea.   Patient endorses 2 episodes of emesis yesterday.   Patient endorses chills at home.   Patient has had a recent COVID exposure.   Patient has taken Tylenol and OTC cough medicine.   Patient is currently breast feeding.

## 2021-09-25 NOTE — Discharge Instructions (Signed)
COVID test pending, will call with results Recommend cough syrup like Robitussin Can take Flonase and Mucinex Can take Tylenol as needed for fever and body aches.

## 2021-09-26 LAB — SARS CORONAVIRUS 2 BY RT PCR: SARS Coronavirus 2 by RT PCR: NEGATIVE

## 2021-12-14 NOTE — Progress Notes (Signed)
This encounter was created in error - please disregard.

## 2022-01-20 IMAGING — US US PELVIS COMPLETE WITH TRANSVAGINAL
1 series · 14 of 25 positions shown · non-contrast
Comparison: None

CLINICAL DATA: Pelvic pain for 5 months

EXAM:
TRANSABDOMINAL AND TRANSVAGINAL ULTRASOUND OF PELVIS
TECHNIQUE: Both transabdominal and transvaginal ultrasound examinations of the
pelvis were performed. Transabdominal technique was performed for
global imaging of the pelvis including uterus, ovaries, adnexal
regions, and pelvic cul-de-sac. It was necessary to proceed with
endovaginal exam following the transabdominal exam to visualize the
adnexal structures.

[Series 1: us pelvic complete with transvaginal · 14 of 83 slices shown]
[im 1/83]
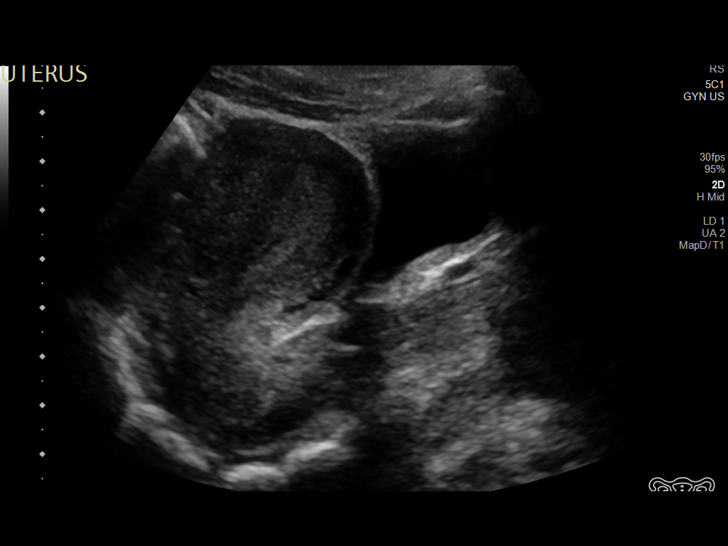
[im 7/83]
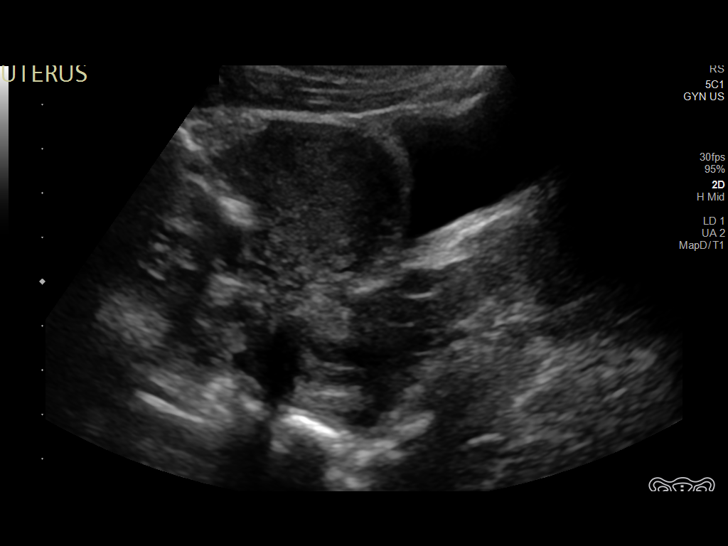
[im 14/83]
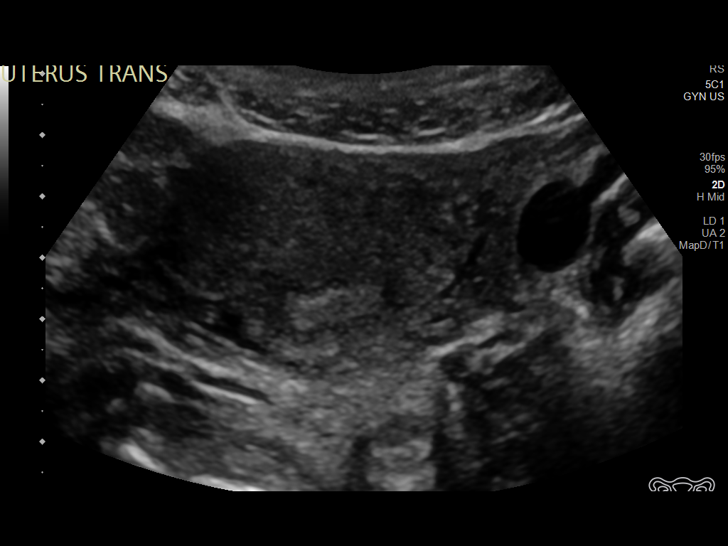
[im 21/83]
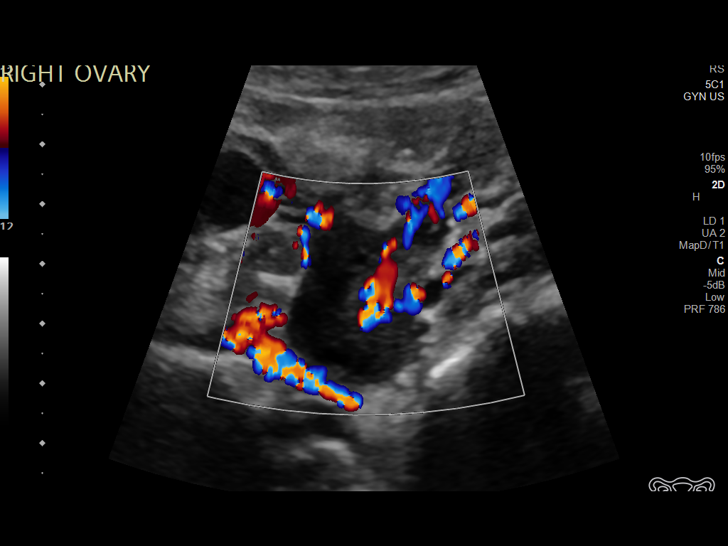
[im 28/83]
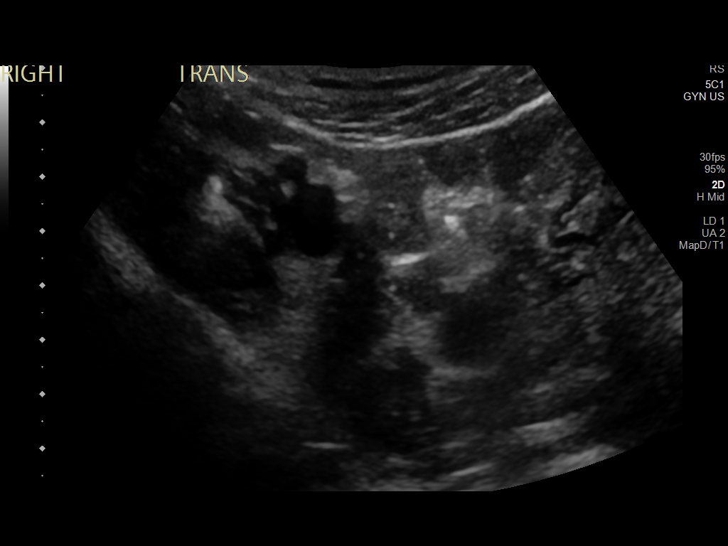
[im 31/83]
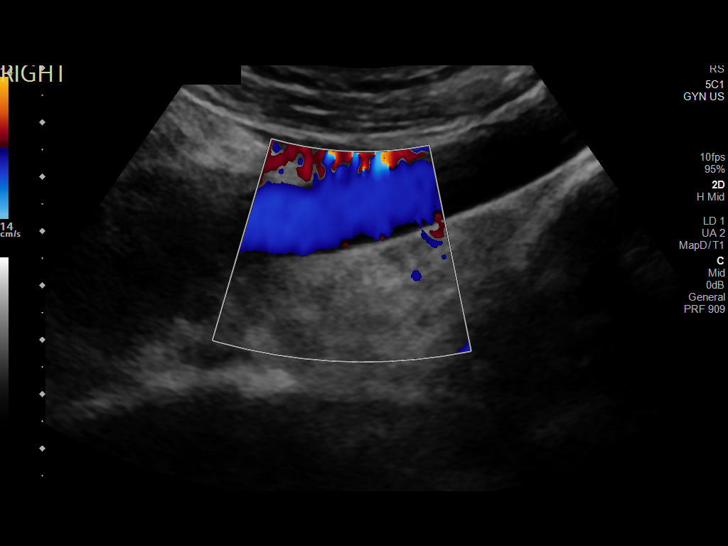
[im 38/83]
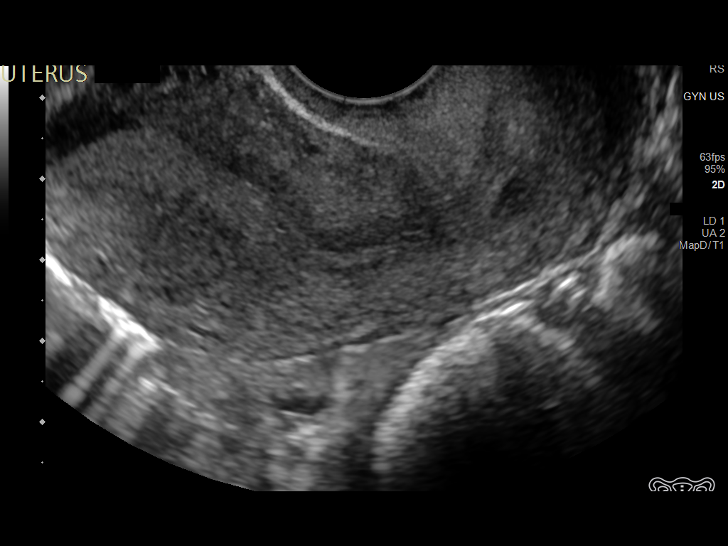
[im 45/83]
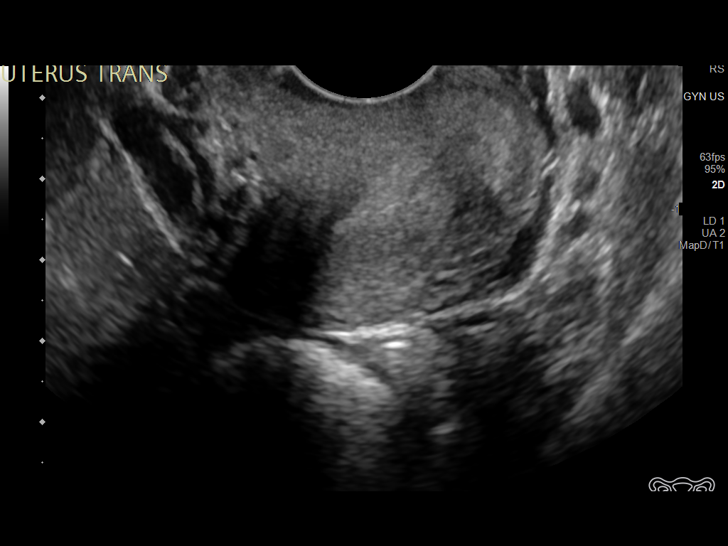
[im 52/83]
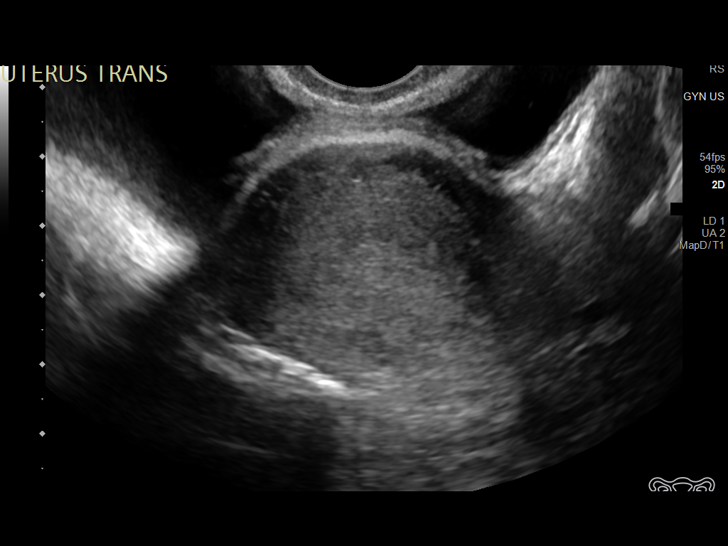
[im 55/83]
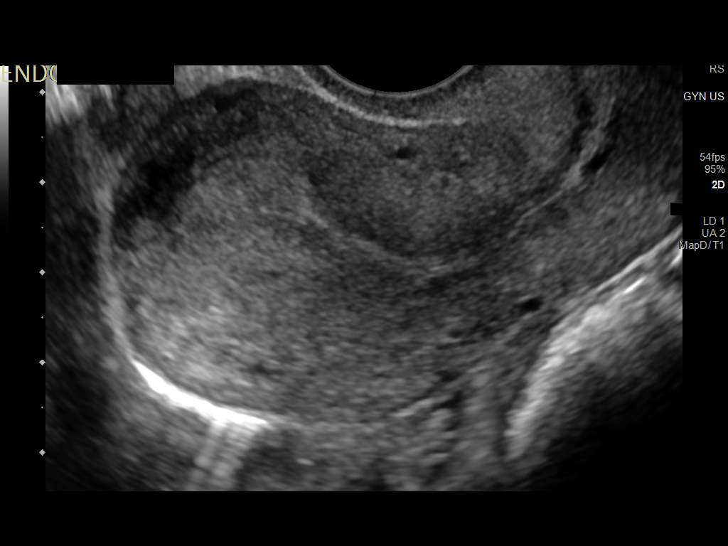
[im 62/83]
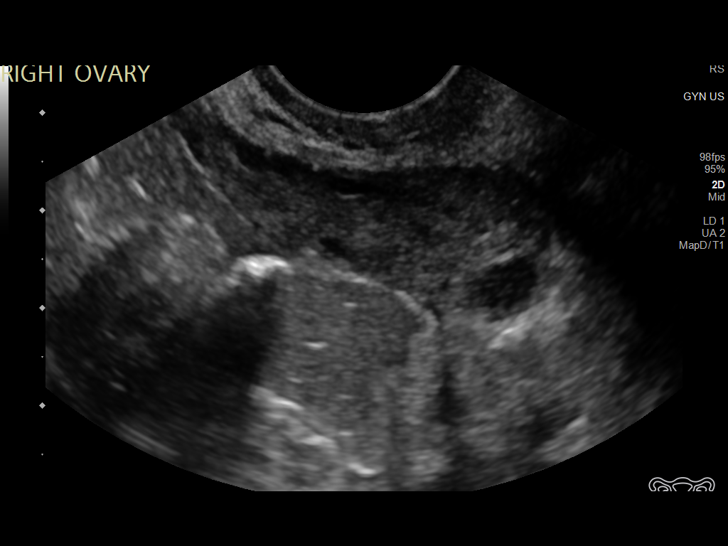
[im 69/83]
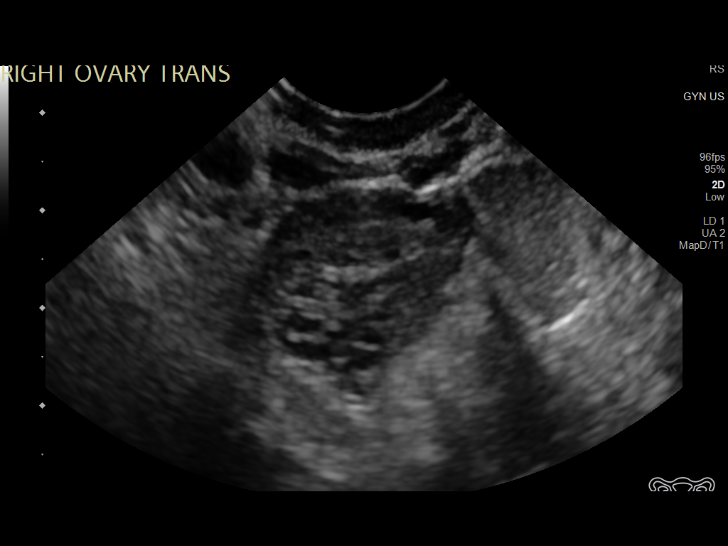
[im 76/83]
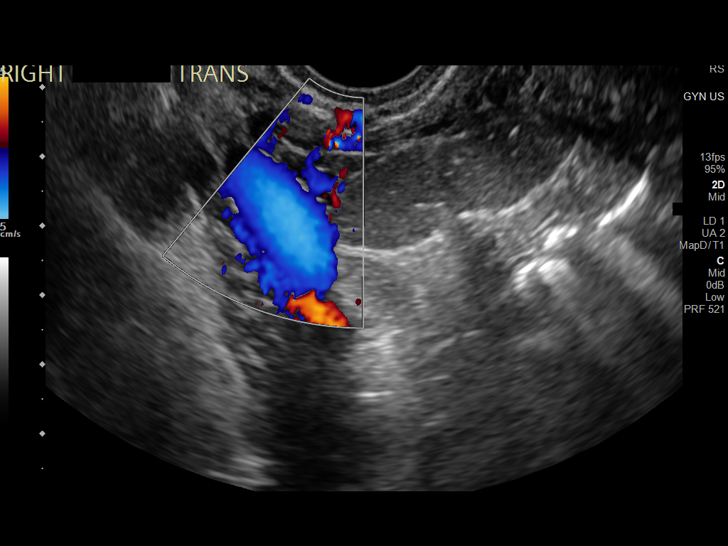
[im 83/83]
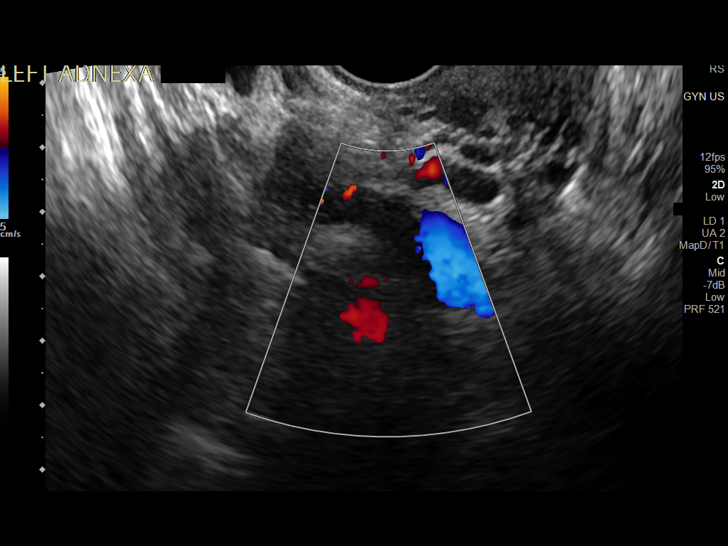

[14 of 25 positions shown; findings below may reference images not displayed]

FINDINGS: Uterus

Measurements: 6.8 x 3.8 x 5.1 cm = volume: 60.6 mL. No fibroids or
other mass visualized.

Endometrium

Thickness: 12 mm.  No focal abnormality visualized.

Right ovary

Measurements: 2.3 x 3.9 x 1.4 cm = volume: 6.3 mL. Normal
appearance/no adnexal mass.

Left ovary

Not visualized due to bowel gas.

Other findings

No free fluid or pelvic mass.
IMPRESSION: 1. Nonvisualization of the left ovary.
2. Otherwise unremarkable pelvic ultrasound.

## 2022-07-19 IMAGING — MR MR ABDOMEN W/O CM
11 of 12 series · 39 of 48 positions shown · non-contrast
Comparison: Pelvic ultrasound same date.

CLINICAL DATA: Right lower quadrant abdominal pain since last
night. 1st trimester pregnancy. History inguinal hernia repair.
Clinical concern for appendicitis.

EXAM:
MRI ABDOMEN AND PELVIS WITHOUT CONTRAST
TECHNIQUE: Multiplanar multisequence MR imaging of the abdomen and pelvis was
performed. No intravenous contrast was administered.

[Series 3: cor haste · coronal · 5.0mm · 1.12mm/px · 2 of 30 slices shown]
[im 1/30]
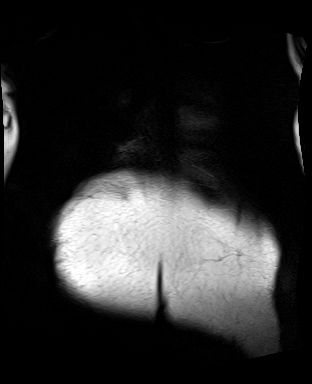
[im 30/30]
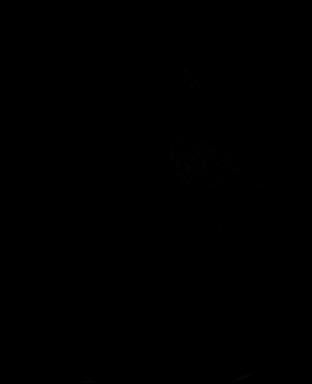

[Series 4: cor haste fs · coronal · 5.0mm · 1.12mm/px · 3 of 30 slices shown]
[im 1/30]
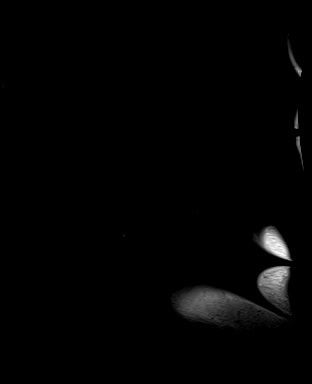
[im 15/30]
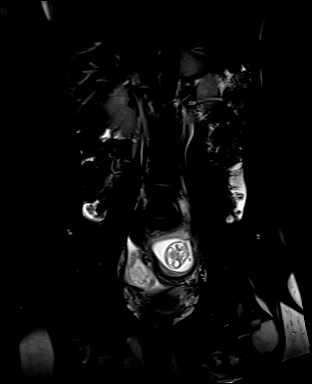
[im 30/30]
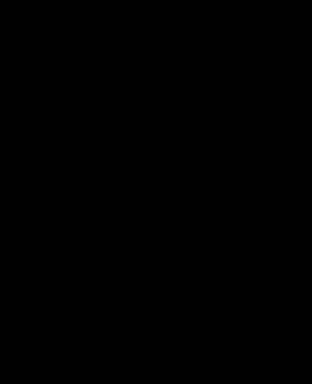

[Series 7: bSSFP · coronal · 5.0mm · 1.92mm/px · 3 of 30 slices shown (1 of 3)]
[im 1/30]
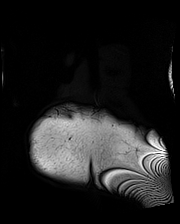
[im 15/30]
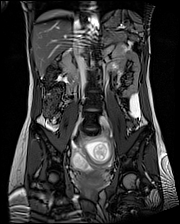
[im 30/30]
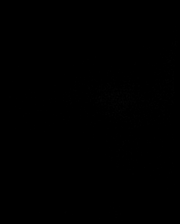

[Series 12: ax haste_comp · axial · 5.0mm · 0.99mm/px · z∈[-34,+176]mm · 4 of 36 slices shown (1 of 2)]
[im 1/36]
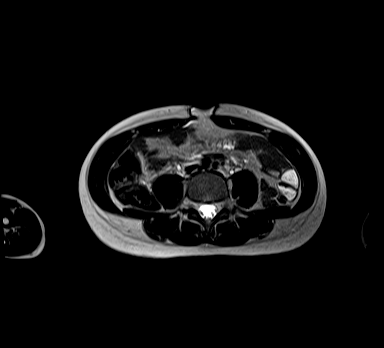
[im 12/36]
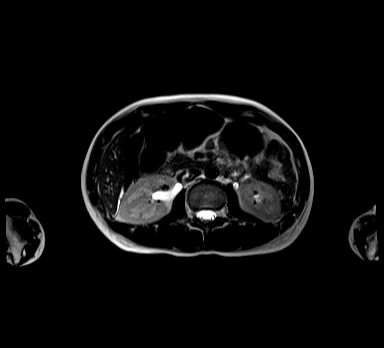
[im 24/36]
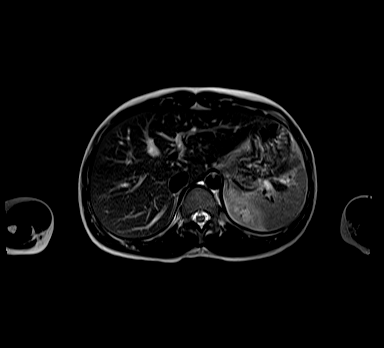
[im 36/36]
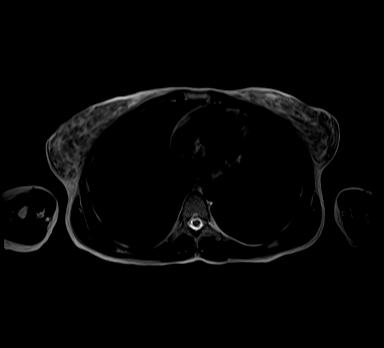

[Series 12: ax haste_comp · axial · 5.0mm · 1.19mm/px · z∈[-249,-40]mm · 4 of 36 slices shown (2 of 2)]
[im 1/36]
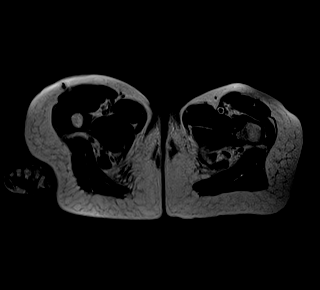
[im 12/36]
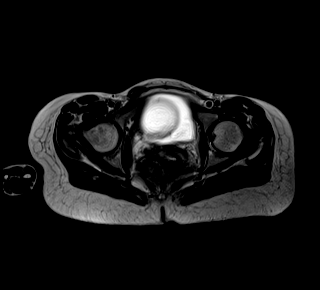
[im 24/36]
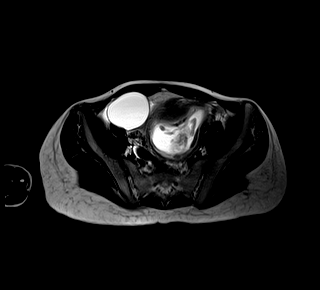
[im 36/36]
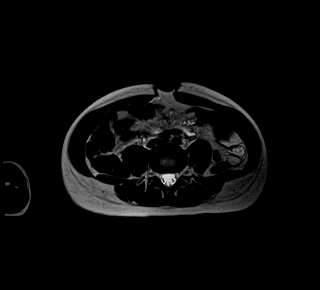

[Series 17: ax haste fs_comp · axial · 5.0mm · 1.19mm/px · z∈[-249,-40]mm · 4 of 36 slices shown (1 of 2)]
[im 1/36]
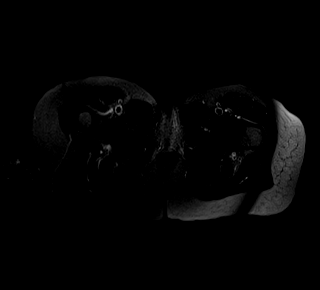
[im 12/36]
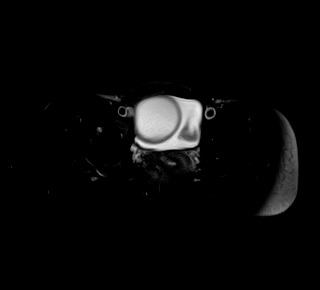
[im 24/36]
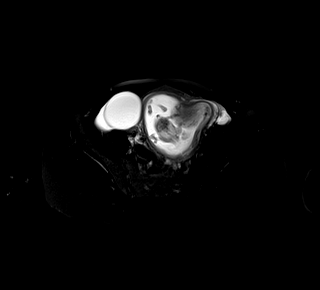
[im 36/36]
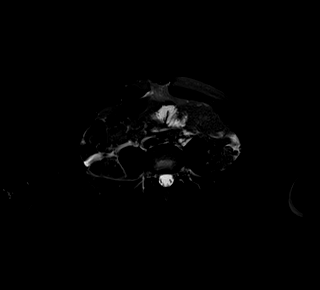

[Series 17: ax haste fs_comp · axial · 5.0mm · 0.99mm/px · z∈[-34,+176]mm · 4 of 36 slices shown (2 of 2)]
[im 1/36]
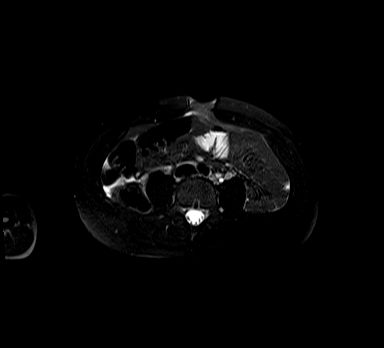
[im 12/36]
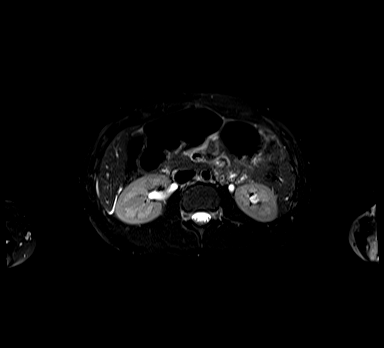
[im 24/36]
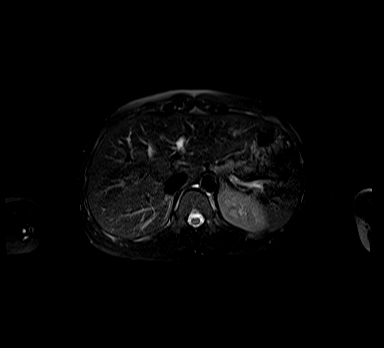
[im 36/36]
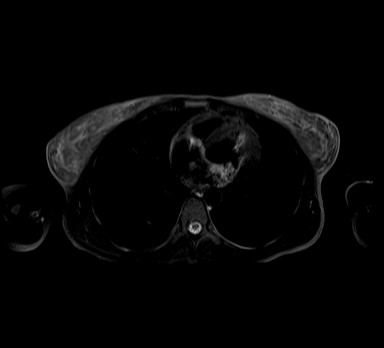

[Series 23: bSSFP · axial · 5.0mm · 0.74mm/px · z∈[-249,-40]mm · 4 of 36 slices shown (2 of 3)]
[im 1/36]
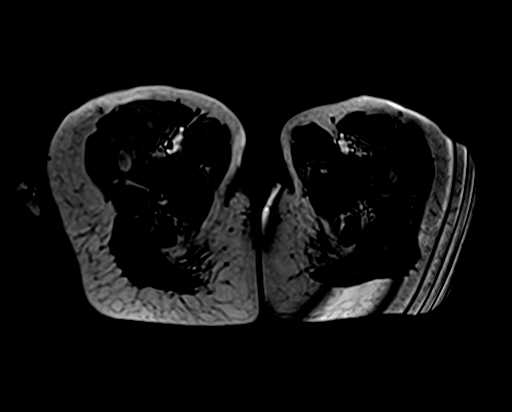
[im 12/36]
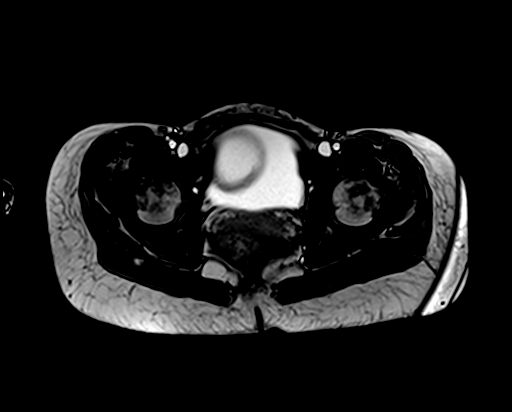
[im 24/36]
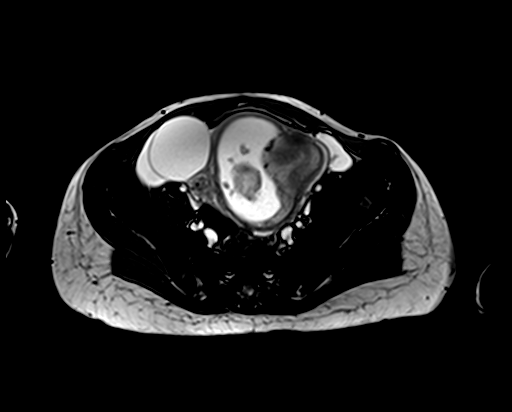
[im 36/36]
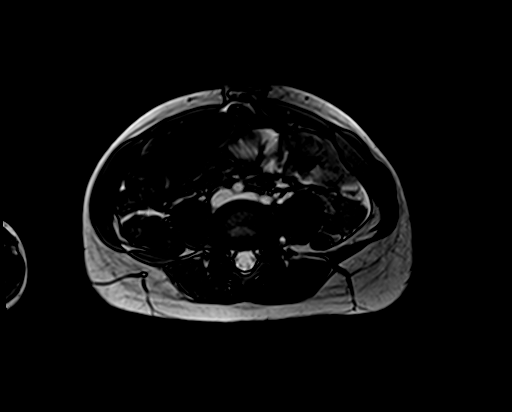

[Series 23: bSSFP · axial · 5.0mm · 1.70mm/px · z∈[-34,+176]mm · 4 of 36 slices shown (3 of 3)]
[im 1/36]
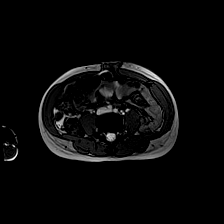
[im 12/36]
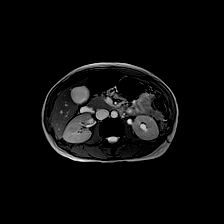
[im 24/36]
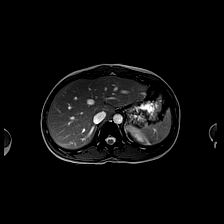
[im 36/36]
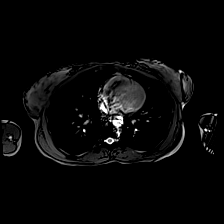

[Series 26: T1 · axial · 5.0mm · 1.48mm/px · z∈[-249,-40]mm · 4 of 36 slices shown (1 of 2)]
[im 1/36]
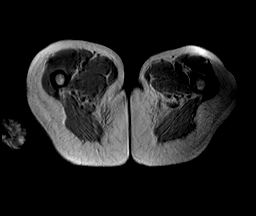
[im 12/36]
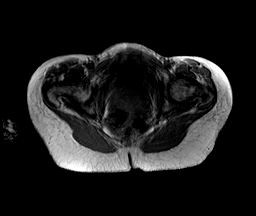
[im 24/36]
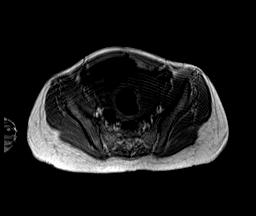
[im 36/36]
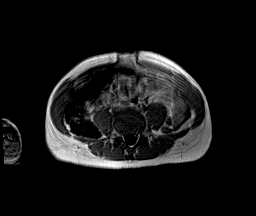

[Series 26: T1 · axial · 5.0mm · 0.74mm/px · z∈[-34,+104]mm · 3 of 36 slices shown (2 of 2)]
[im 1/36]
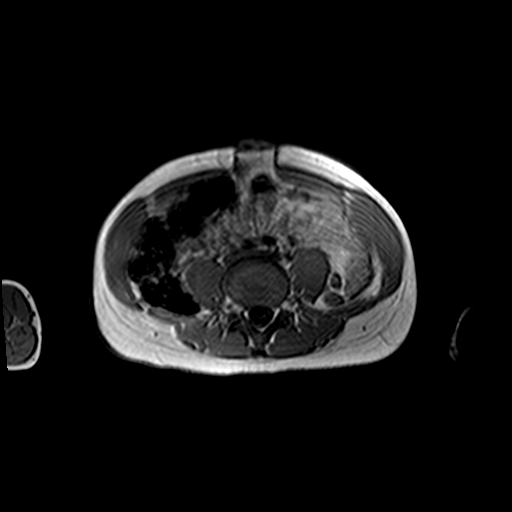
[im 12/36]
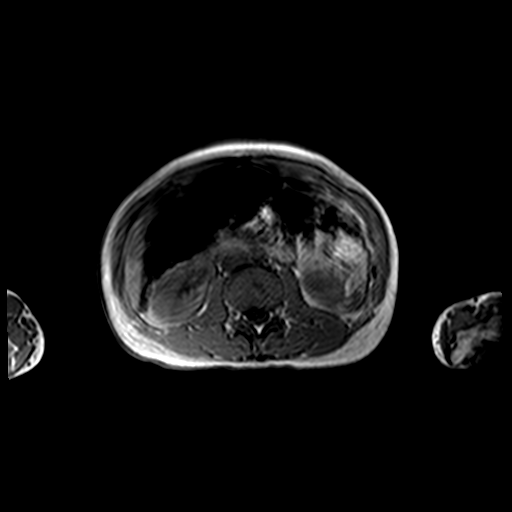
[im 24/36]
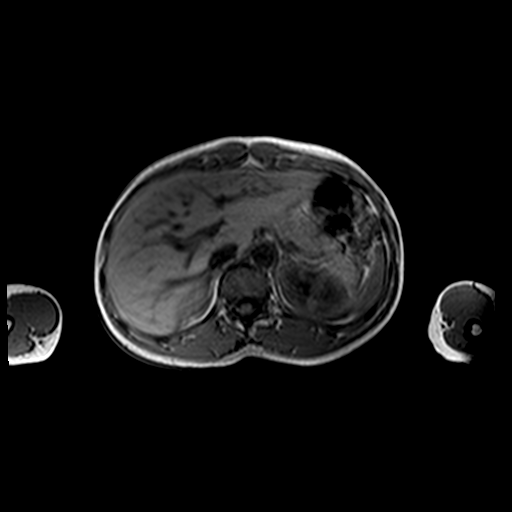

[39 of 48 positions shown; findings below may reference images not displayed]

FINDINGS: COMBINED FINDINGS FOR BOTH MR ABDOMEN AND PELVIS

Lower chest:  Visualized lower chest appears unremarkable.

Hepatobiliary: The liver is normal in signal without focal
abnormality. No evidence of gallstones, gallbladder wall thickening
or biliary dilatation.

Pancreas: Unremarkable. No pancreatic ductal dilatation or
surrounding inflammatory changes.

Spleen: Normal in size without focal abnormality.

Adrenals/Urinary Tract: Both adrenal glands appear normal. No
evidence of renal mass, hydronephrosis or perinephric fluid
collection.

Stomach/Bowel: The stomach appears unremarkable for its degree of
distension. No evidence of bowel wall thickening, distention or
surrounding inflammatory change. The appendix is visualized on the
axial images and appears normal in caliber.

Vascular/Lymphatic: There are no enlarged abdominal or pelvic lymph
nodes. No significant vascular findings.

Reproductive: Intrauterine pregnancy is noted. As seen on earlier
ultrasound, there are large adnexal cystic masses which are
predominately right-sided and midline on this examination. There is
a right-sided component measuring 5.0 x 4.2 x 4.3 cm, and a more
midline component superior to the bladder which measures 6.9 x 6.8 x
6.6 cm. This has mass effect on the bladder. No clear left adnexal
mass identified by this study. There is free peritoneal fluid
surrounding the cysts, suggesting partial rupture.

Other: As above, moderate amount of fluid surrounding the right
adnexal cysts without generalized ascites or focal extraluminal
fluid collection.

Musculoskeletal: No acute or significant osseous findings. No
inguinal abnormality identified.
IMPRESSION: 1. No evidence of acute appendicitis.
2. Large mildly complex cystic right adnexal masses with surrounding
free fluid suspicious for ruptured right ovarian cysts. Given the
size of the cysts, pelvic ultrasound follow-up recommended.
3. No hydronephrosis.
